# Patient Record
Sex: Male | Born: 1965 | Race: White | Hispanic: No | Marital: Married | Smoking: Former smoker
Health system: Southern US, Community
[De-identification: ages and names within clinical notes are randomized; demographics above are authoritative.]

## PROBLEM LIST (undated history)

## (undated) DIAGNOSIS — Z8619 Personal history of other infectious and parasitic diseases: Secondary | ICD-10-CM

## (undated) DIAGNOSIS — T7840XA Allergy, unspecified, initial encounter: Secondary | ICD-10-CM

## (undated) DIAGNOSIS — F419 Anxiety disorder, unspecified: Secondary | ICD-10-CM

## (undated) DIAGNOSIS — K219 Gastro-esophageal reflux disease without esophagitis: Secondary | ICD-10-CM

## (undated) DIAGNOSIS — I10 Essential (primary) hypertension: Secondary | ICD-10-CM

## (undated) DIAGNOSIS — K7460 Unspecified cirrhosis of liver: Secondary | ICD-10-CM

## (undated) DIAGNOSIS — I219 Acute myocardial infarction, unspecified: Secondary | ICD-10-CM

## (undated) DIAGNOSIS — E119 Type 2 diabetes mellitus without complications: Secondary | ICD-10-CM

## (undated) HISTORY — PX: KNEE SURGERY: SHX244

## (undated) HISTORY — DX: Type 2 diabetes mellitus without complications: E11.9

## (undated) HISTORY — DX: Personal history of other infectious and parasitic diseases: Z86.19

## (undated) HISTORY — DX: Allergy, unspecified, initial encounter: T78.40XA

## (undated) HISTORY — DX: Essential (primary) hypertension: I10

## (undated) HISTORY — DX: Anxiety disorder, unspecified: F41.9

## (undated) HISTORY — DX: Gastro-esophageal reflux disease without esophagitis: K21.9

---

## 2003-08-11 DIAGNOSIS — I2510 Atherosclerotic heart disease of native coronary artery without angina pectoris: Secondary | ICD-10-CM | POA: Insufficient documentation

## 2005-08-10 DIAGNOSIS — R945 Abnormal results of liver function studies: Secondary | ICD-10-CM | POA: Insufficient documentation

## 2006-01-20 ENCOUNTER — Ambulatory Visit: Payer: Self-pay | Admitting: Family Medicine

## 2006-03-24 DIAGNOSIS — E785 Hyperlipidemia, unspecified: Secondary | ICD-10-CM | POA: Insufficient documentation

## 2006-08-10 HISTORY — PX: OTHER SURGICAL HISTORY: SHX169

## 2007-02-08 DIAGNOSIS — I252 Old myocardial infarction: Secondary | ICD-10-CM | POA: Insufficient documentation

## 2007-03-08 ENCOUNTER — Other Ambulatory Visit: Payer: Self-pay

## 2007-03-08 ENCOUNTER — Emergency Department: Payer: Self-pay | Admitting: Emergency Medicine

## 2007-05-17 ENCOUNTER — Ambulatory Visit: Payer: Self-pay | Admitting: Family Medicine

## 2007-08-11 DIAGNOSIS — F410 Panic disorder [episodic paroxysmal anxiety] without agoraphobia: Secondary | ICD-10-CM | POA: Insufficient documentation

## 2007-08-14 ENCOUNTER — Emergency Department: Payer: Self-pay | Admitting: Emergency Medicine

## 2007-08-14 ENCOUNTER — Other Ambulatory Visit: Payer: Self-pay

## 2007-09-12 ENCOUNTER — Ambulatory Visit: Payer: Self-pay | Admitting: Family Medicine

## 2007-09-21 ENCOUNTER — Ambulatory Visit: Payer: Self-pay | Admitting: Family Medicine

## 2007-10-17 ENCOUNTER — Ambulatory Visit: Payer: Self-pay | Admitting: Family Medicine

## 2007-11-26 ENCOUNTER — Other Ambulatory Visit: Payer: Self-pay

## 2007-11-26 ENCOUNTER — Emergency Department: Payer: Self-pay | Admitting: Emergency Medicine

## 2007-12-08 ENCOUNTER — Ambulatory Visit: Payer: Self-pay | Admitting: Family Medicine

## 2008-02-29 ENCOUNTER — Ambulatory Visit (HOSPITAL_COMMUNITY): Admission: RE | Admit: 2008-02-29 | Discharge: 2008-02-29 | Payer: Self-pay | Admitting: Neurology

## 2008-11-16 IMAGING — RF DG UGI W/ SMALL BOWEL
2 series · 14 of 24 positions shown · non-contrast
Comparison: none

REASON FOR EXAM: epigastric pain
COMMENTS:

[Series 1: run · 20 acquisitions, 12 frames shown (1 of 2)]
[im 1/20]
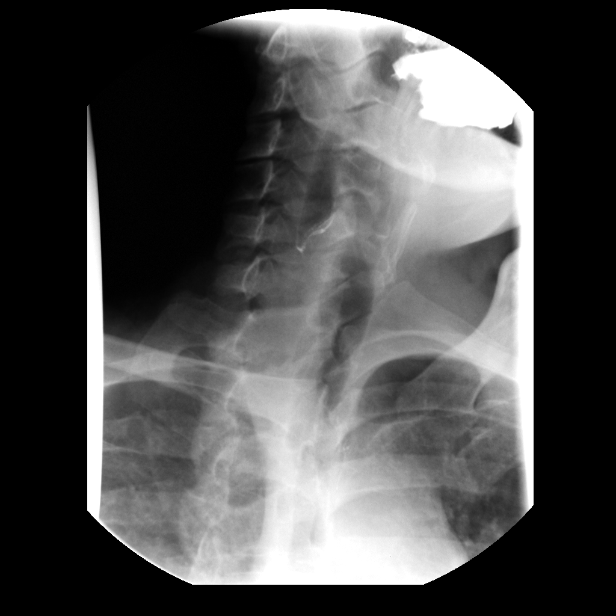
[im 2/20]
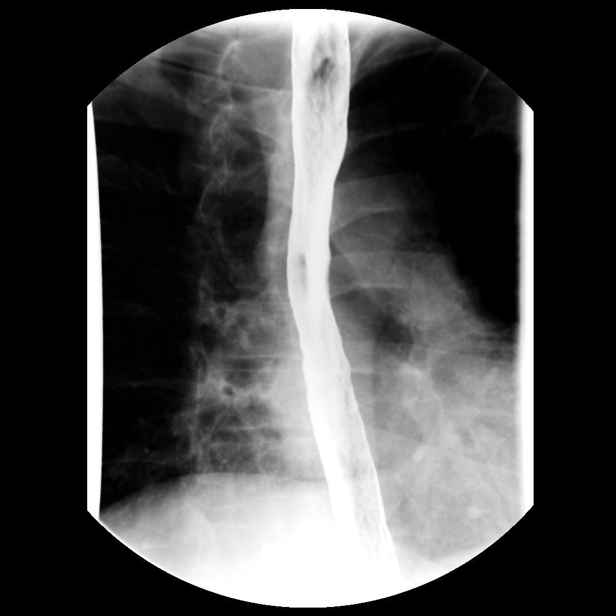
[im 2/20]
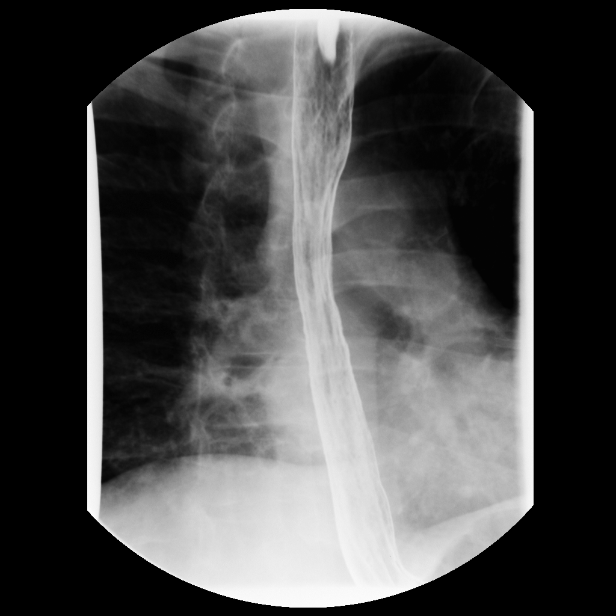
[im 3/20]
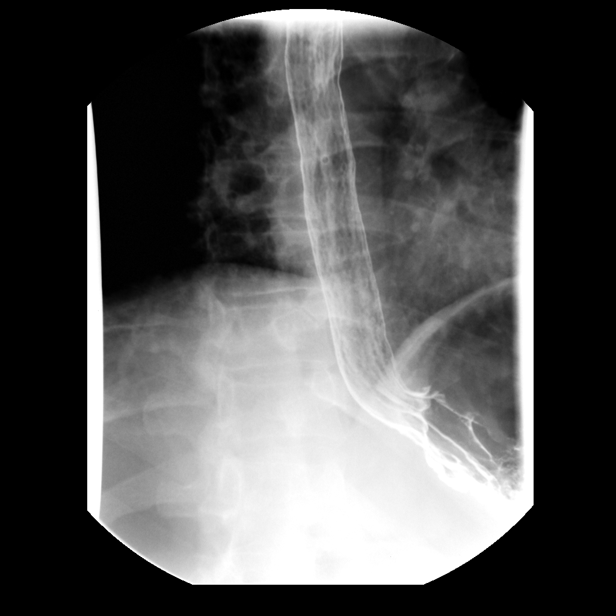
[im 5/20]
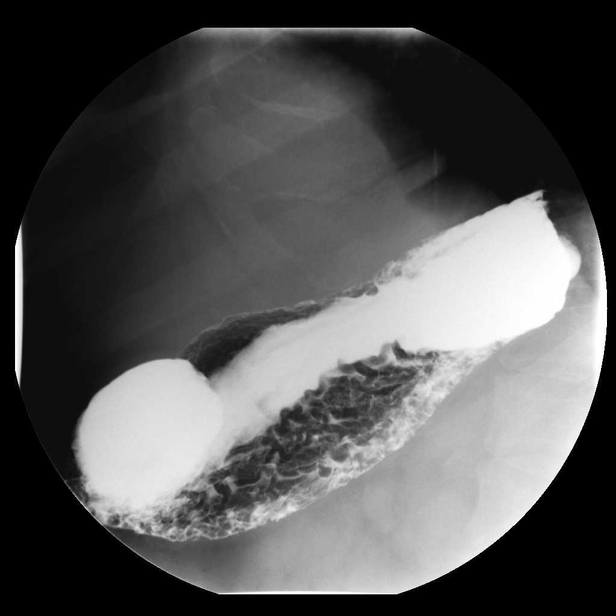
[im 10/20]
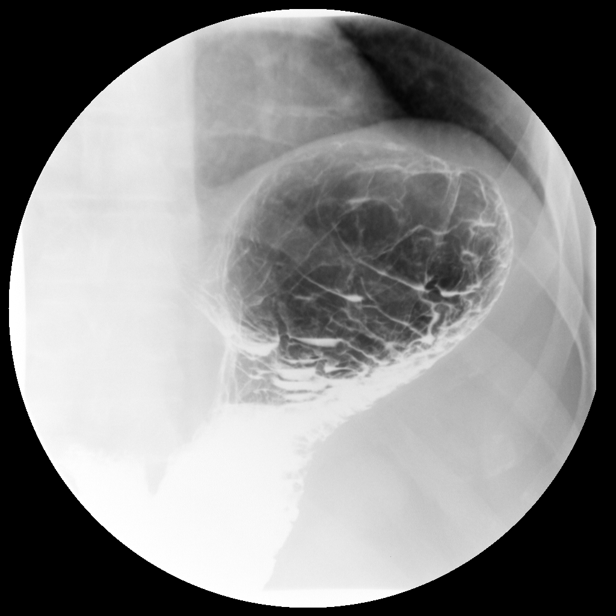
[im 14/20]
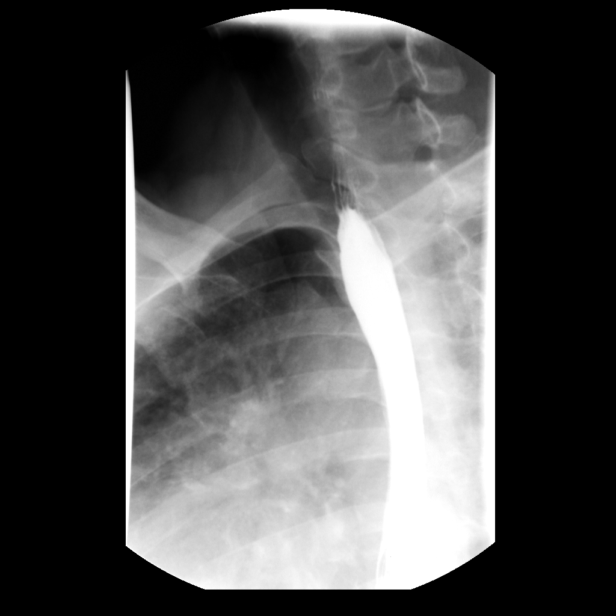
[im 14/20]
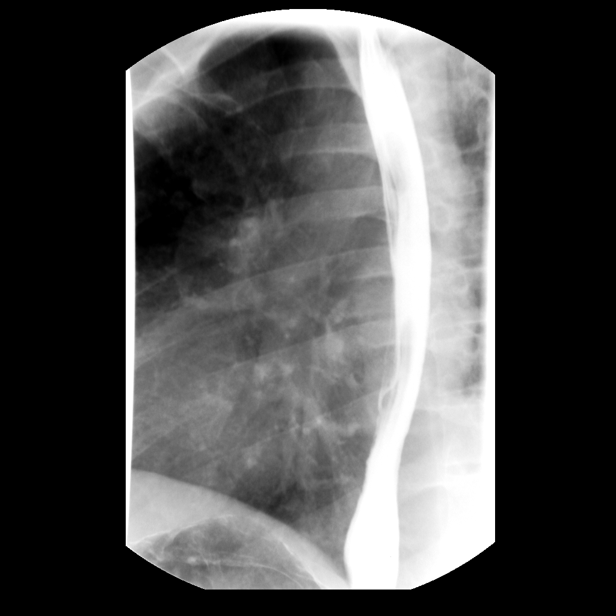
[im 15/20]
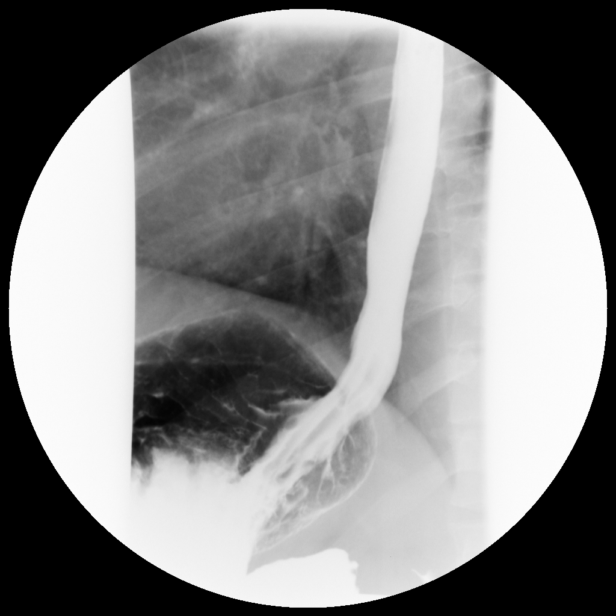
[im 16/20]
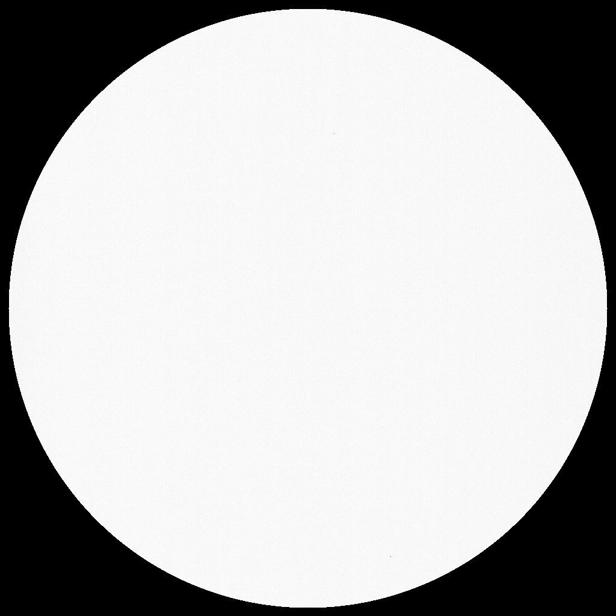
[im 18/20]
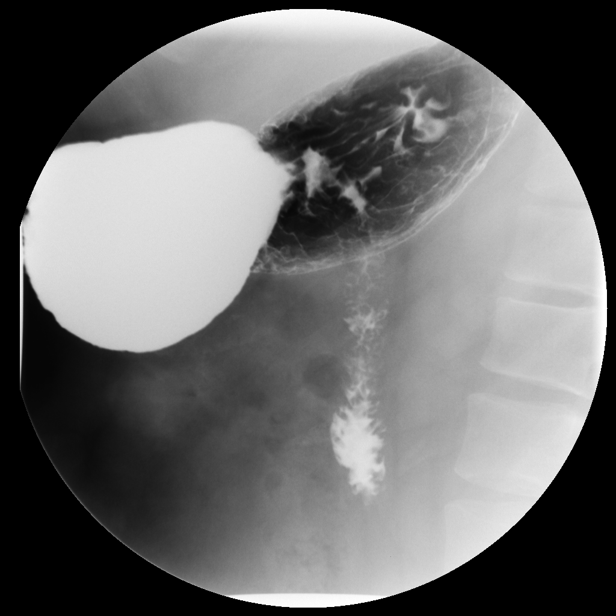
[im 20/20]
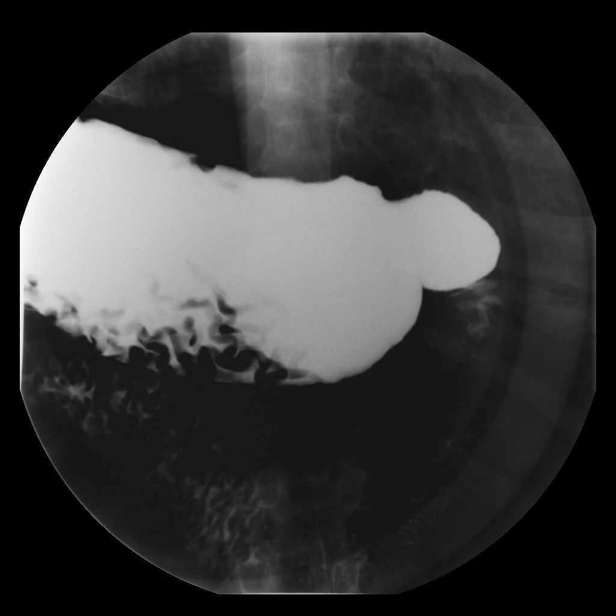

[Series 1: run · 2 of 8 slices shown (2 of 2)]
[im 3/8]
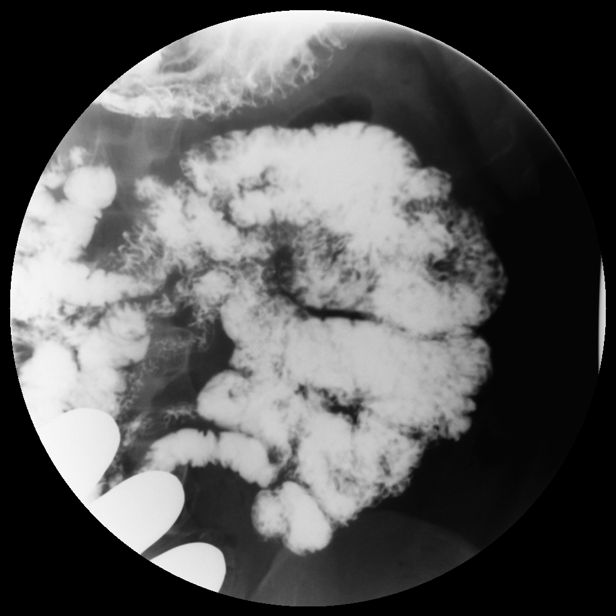
[im 8/8]
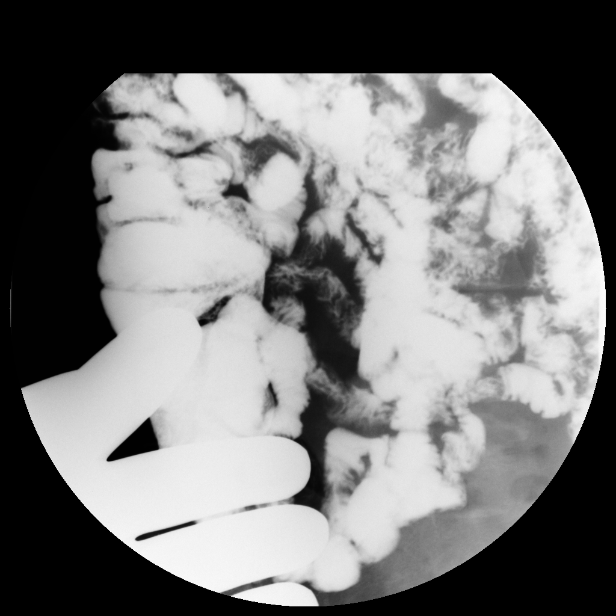

[14 of 24 positions shown; findings below may reference images not displayed]

PROCEDURE:     FL  - FL UPPER GI WITH SMALL BOWEL  - September 21, 2007 [DATE]

RESULT:     Comparison: No available comparison exam.

Procedure:

A scout film of the abdomen was obtained. Following the ingestion of
gas-forming crystals, the patient ingested thick barium in the upright
position. Multiple fluoroscopic spot images of the esophagus were obtained.
The patient was placed in a prone position, and turned from right lateral
decubitus through the supine position to left lateral decubitus. Multiple
fluoroscopic spot images of the stomach and duodenum were obtained.
Following this, the patient was placed in a prone right side down oblique
position and thin barium was ingested while multiple fluoroscopic spot
images of the esophagus were obtained.

Patient ingested additional barium contrast that was followed through the
small bowel to the terminal ileum and into the cecum. Fluoroscopic spot
imaging was performed to more closely evaluate areas of interest.
FINDINGS: Scout image shows no bowel obstruction.

The swallowing function is grossly normal. There seems to be mildly delayed
gastric emptying. The esophagus, gastroesophageal junction, stomach,
pylorus, duodenal bulb, and duodenal sweep are otherwise unremarkable. There
is no evidence of stricture, mass, diverticula, hiatal hernia,
gastroesophageal reflux, or gross ulceration.

Contrast transit time to the colon was 60 minutes. Fluoroscopy revealed
normal mobility. The mucosal pattern and configuration of the small bowel
was normal. There were no persistent filling defects.
IMPRESSION: 1. There seems to be mildly delayed gastric emptying.

2. Unremarkable small bowel follow through.

## 2008-12-05 DIAGNOSIS — K219 Gastro-esophageal reflux disease without esophagitis: Secondary | ICD-10-CM | POA: Insufficient documentation

## 2008-12-05 DIAGNOSIS — E1122 Type 2 diabetes mellitus with diabetic chronic kidney disease: Secondary | ICD-10-CM | POA: Insufficient documentation

## 2008-12-05 DIAGNOSIS — I10 Essential (primary) hypertension: Secondary | ICD-10-CM | POA: Insufficient documentation

## 2009-10-08 DIAGNOSIS — F419 Anxiety disorder, unspecified: Secondary | ICD-10-CM | POA: Insufficient documentation

## 2011-03-06 DIAGNOSIS — Z8619 Personal history of other infectious and parasitic diseases: Secondary | ICD-10-CM | POA: Insufficient documentation

## 2011-09-13 HISTORY — PX: OTHER SURGICAL HISTORY: SHX169

## 2013-11-01 DIAGNOSIS — I700 Atherosclerosis of aorta: Secondary | ICD-10-CM | POA: Insufficient documentation

## 2013-11-01 DIAGNOSIS — I6529 Occlusion and stenosis of unspecified carotid artery: Secondary | ICD-10-CM | POA: Insufficient documentation

## 2014-08-07 DIAGNOSIS — R809 Proteinuria, unspecified: Secondary | ICD-10-CM | POA: Insufficient documentation

## 2014-09-17 LAB — LIPID PANEL
Cholesterol: 176 mg/dL (ref 0–200)
HDL: 35 mg/dL (ref 35–70)
LDL Cholesterol: 111 mg/dL
Triglycerides: 149 mg/dL (ref 40–160)

## 2014-09-17 LAB — CBC AND DIFFERENTIAL
HEMATOCRIT: 44 % (ref 41–53)
HEMOGLOBIN: 15 g/dL (ref 13.5–17.5)
Platelets: 256 10*3/uL (ref 150–399)
WBC: 7.7 10^3/mL

## 2014-09-17 LAB — HEPATIC FUNCTION PANEL: ALT: 61 U/L — AB (ref 10–40)

## 2015-01-29 ENCOUNTER — Other Ambulatory Visit: Payer: Self-pay | Admitting: *Deleted

## 2015-01-29 MED ORDER — CLOPIDOGREL BISULFATE 75 MG PO TABS: 75.0000 mg | ORAL_TABLET | Freq: Every day | ORAL | Status: DC

## 2015-01-29 MED ORDER — LISINOPRIL 10 MG PO TABS: 10.0000 mg | ORAL_TABLET | Freq: Every day | ORAL | Status: DC

## 2015-01-29 NOTE — Telephone Encounter (Signed)
Refill request for clopidogrel 75 mg and Lisinopril 10 mg Last filled by MD on- 12/12/2013 #90 x3 Last Appt: 12/13/2014 Next Appt: none Please advise refill?

## 2015-05-23 ENCOUNTER — Other Ambulatory Visit: Payer: Self-pay | Admitting: Family Medicine

## 2015-07-22 ENCOUNTER — Other Ambulatory Visit: Payer: Self-pay | Admitting: Family Medicine

## 2015-09-11 ENCOUNTER — Other Ambulatory Visit: Payer: Self-pay | Admitting: Family Medicine

## 2015-10-23 ENCOUNTER — Other Ambulatory Visit: Payer: Self-pay | Admitting: Family Medicine

## 2015-11-25 ENCOUNTER — Other Ambulatory Visit: Payer: Self-pay | Admitting: Family Medicine

## 2015-11-25 DIAGNOSIS — E119 Type 2 diabetes mellitus without complications: Secondary | ICD-10-CM | POA: Diagnosis not present

## 2015-12-04 ENCOUNTER — Other Ambulatory Visit: Payer: Self-pay | Admitting: Family Medicine

## 2015-12-29 ENCOUNTER — Other Ambulatory Visit: Payer: Self-pay | Admitting: Family Medicine

## 2016-01-01 DIAGNOSIS — E119 Type 2 diabetes mellitus without complications: Secondary | ICD-10-CM | POA: Diagnosis not present

## 2016-01-14 DIAGNOSIS — E1165 Type 2 diabetes mellitus with hyperglycemia: Secondary | ICD-10-CM | POA: Diagnosis not present

## 2016-01-14 DIAGNOSIS — Z794 Long term (current) use of insulin: Secondary | ICD-10-CM | POA: Diagnosis not present

## 2016-01-14 DIAGNOSIS — Z9641 Presence of insulin pump (external) (internal): Secondary | ICD-10-CM | POA: Diagnosis not present

## 2016-01-14 DIAGNOSIS — E1129 Type 2 diabetes mellitus with other diabetic kidney complication: Secondary | ICD-10-CM | POA: Diagnosis not present

## 2016-01-20 ENCOUNTER — Ambulatory Visit: Payer: Self-pay | Admitting: Family Medicine

## 2016-01-20 DIAGNOSIS — R5383 Other fatigue: Secondary | ICD-10-CM | POA: Insufficient documentation

## 2016-01-20 DIAGNOSIS — J309 Allergic rhinitis, unspecified: Secondary | ICD-10-CM | POA: Insufficient documentation

## 2016-01-20 DIAGNOSIS — Z79899 Other long term (current) drug therapy: Secondary | ICD-10-CM | POA: Insufficient documentation

## 2016-01-20 DIAGNOSIS — K824 Cholesterolosis of gallbladder: Secondary | ICD-10-CM | POA: Insufficient documentation

## 2016-01-21 ENCOUNTER — Ambulatory Visit (INDEPENDENT_AMBULATORY_CARE_PROVIDER_SITE_OTHER): Payer: BLUE CROSS/BLUE SHIELD | Admitting: Family Medicine

## 2016-01-21 ENCOUNTER — Encounter: Payer: Self-pay | Admitting: Family Medicine

## 2016-01-21 VITALS — BP 128/76 | HR 81 | Temp 98.1°F | Resp 16 | Wt 235.0 lb

## 2016-01-21 DIAGNOSIS — I2510 Atherosclerotic heart disease of native coronary artery without angina pectoris: Secondary | ICD-10-CM

## 2016-01-21 DIAGNOSIS — F410 Panic disorder [episodic paroxysmal anxiety] without agoraphobia: Secondary | ICD-10-CM

## 2016-01-21 DIAGNOSIS — K219 Gastro-esophageal reflux disease without esophagitis: Secondary | ICD-10-CM | POA: Diagnosis not present

## 2016-01-21 MED ORDER — SERTRALINE HCL 100 MG PO TABS: 100.0000 mg | ORAL_TABLET | Freq: Every day | ORAL | Status: DC

## 2016-01-21 MED ORDER — PANTOPRAZOLE SODIUM 40 MG PO TBEC: 40.0000 mg | DELAYED_RELEASE_TABLET | Freq: Every day | ORAL | Status: DC

## 2016-01-21 NOTE — Progress Notes (Signed)
Patient: Alex [name] Male    DOB: 05/02/1966   50 y.o.   MRN: [id number] Visit Date: 01/21/2016  Today's Provider: Lelon Huh, MD   Chief Complaint  Patient presents with  . Follow-up  . Hypertension  . Gastroesophageal Reflux  . Hyperlipidemia  . Anxiety   Subjective:    HPI   Follow-up for GERD from 09/14/2014 no changes. Taking pantoprazole every day which completely controls reflux symptoms. He starts having heartburn and reflux 1-2 days if he misses medication. Denies any adverse effects. Takes Reglan occasionally.   Follow-up for CAD from 09/14/2014 no changes.  Still seeing Nehemiah Massed twice a year. No chest pains or dyspnea. Working full time  Follow-up for anxiety disorder from 09/14/2014 no changes. Doing very well on sertraline. Rarely has anxiety attacks.      Hypertension, follow-up:  BP Readings from Last 3 Encounters:  01/21/16 128/76    He was last seen for hypertension 09/14/2014.  BP at that visit was 124/80 Management since that visit includes;He reports good compliance with treatment. He is not having side effects. none  He is not exercising. He is adherent to low salt diet.   Outside blood pressures are none. He is experiencing none.  Patient denies none.   Cardiovascular risk factors include diabetes mellitus.  Use of agents associated with hypertension: none.   ----------------------------------------------------------------------    Lipid/Cholesterol, Follow-up:   Last seen for this 09/14/2014. Management since that visit includes; recommended he cut back on saturated fats in diet  Last Lipid Panel: Last month with Dr. Gabriel Carina and are well controlled.   He reports good compliance with treatment. He is not having side effects. none  Wt Readings from Last 3 Encounters:  01/21/16 235 lb (106.595 kg)    ----------------------------------------------------------------------  Patient Care Team    Relationship Specialty Notifications  Start End  Birdie Sons, MD PCP - General Family Medicine  01/20/16   Corey Skains, MD Consulting Physician Internal Medicine  01/20/16   Judi Cong, MD Physician Assistant Internal Medicine  01/21/16       Allergies  Allergen Reactions  . Aleve  [Naproxen Sodium]   . Mobic  [Meloxicam]   . Voltaren  [Diclofenac Sodium]    Current Meds  Medication Sig  . aspirin 81 MG tablet   . clopidogrel (PLAVIX) 75 MG tablet take 1 tablet by mouth once daily  . Cyanocobalamin (VITAMIN B 12) 250 MCG LOZG Take by mouth.  . fluticasone (FLONASE) 50 MCG/ACT nasal spray Place into the nose.  Marland Kitchen glipiZIDE (GLUCOTROL XL) 10 MG 24 hr tablet Take 10 mg by mouth daily.   Marland Kitchen glucose blood test strip   . Insulin Glargine (TOUJEO SOLOSTAR) 300 UNIT/ML SOPN Inject 77 Units into the skin daily.   . insulin lispro (HUMALOG KWIKPEN) 100 UNIT/ML KiwkPen Inject 15 Units into the skin daily.   Marland Kitchen lisinopril (PRINIVIL,ZESTRIL) 10 MG tablet Take 1 tablet (10 mg total) by mouth daily.  Marland Kitchen LORazepam (ATIVAN) 0.5 MG tablet Take by mouth. 1-2 every 6 hours as needed  . metFORMIN (GLUCOPHAGE) 500 MG tablet Take 500 mg by mouth daily.   . metoCLOPramide (REGLAN) 10 MG tablet Take by mouth as needed.   . Omega-3 Fatty Acids (FISH OIL) 1000 MG CAPS Take 1 capsule by mouth 2 (two) times daily.   . pantoprazole (PROTONIX) 40 MG tablet take 1 tablet by mouth once daily  . rosuvastatin (CRESTOR) 40 MG tablet Take  1 tablet (40 mg total) by mouth daily. PATIENT NEEDS TO SCHEDULE OFFICE VISIT FOR FOLLOW UP  . sertraline (ZOLOFT) 100 MG tablet take 1 tablet by mouth once daily    Review of Systems  Constitutional: Negative for fever, chills and appetite change.  Respiratory: Negative for chest tightness, shortness of breath and wheezing.   Cardiovascular: Negative for chest pain and palpitations.  Gastrointestinal: Negative for nausea, vomiting and abdominal pain.    Social History  Substance Use Topics  . Smoking status:  Former Smoker -- 1.50 packs/day for 20 years    Types: Cigarettes    Quit date: 08/10/2006  . Smokeless tobacco: Not on file  . Alcohol Use: No   Objective:   BP 128/76 mmHg  Pulse 81  Temp(Src) 98.1 F (36.7 C) (Oral)  Resp 16  Wt 235 lb (106.595 kg)  SpO2 97%  Physical Exam   General Appearance:    Alert, cooperative, no distress  Eyes:    PERRL, conjunctiva/corneas clear, EOM's intact       Lungs:     Clear to auscultation bilaterally, respirations unlabored  Heart:    Regular rate and rhythm  Neurologic:   Awake, alert, oriented x 3. No apparent focal neurological           defect.          Assessment & Plan:     1. Gastroesophageal reflux disease, esophagitis presence not specified Well controlled.  Continue current medications.    2. Coronary artery disease involving native coronary artery of native heart without angina pectoris Asymptomatic. Compliant with medication.  Continue aggressive risk factor modification.  Continue regular follow up Dr. Nehemiah Massed  3. Panic disorder Well controlled on daily sertraline and occasional prn lorazepam.  - sertraline (ZOLOFT) 100 MG tablet; Take 1 tablet (100 mg total) by mouth daily.  Dispense: 90 tablet; Refill: 3        Lelon Huh, MD  [facility]

## 2016-02-04 DIAGNOSIS — E119 Type 2 diabetes mellitus without complications: Secondary | ICD-10-CM | POA: Diagnosis not present

## 2016-03-03 DIAGNOSIS — E119 Type 2 diabetes mellitus without complications: Secondary | ICD-10-CM | POA: Diagnosis not present

## 2016-03-12 ENCOUNTER — Other Ambulatory Visit: Payer: Self-pay | Admitting: Family Medicine

## 2016-03-18 DIAGNOSIS — I2510 Atherosclerotic heart disease of native coronary artery without angina pectoris: Secondary | ICD-10-CM | POA: Diagnosis not present

## 2016-03-18 DIAGNOSIS — I10 Essential (primary) hypertension: Secondary | ICD-10-CM | POA: Diagnosis not present

## 2016-03-18 DIAGNOSIS — I6523 Occlusion and stenosis of bilateral carotid arteries: Secondary | ICD-10-CM | POA: Diagnosis not present

## 2016-03-18 DIAGNOSIS — K210 Gastro-esophageal reflux disease with esophagitis: Secondary | ICD-10-CM | POA: Diagnosis not present

## 2016-03-30 DIAGNOSIS — E119 Type 2 diabetes mellitus without complications: Secondary | ICD-10-CM | POA: Diagnosis not present

## 2016-04-14 DIAGNOSIS — Z9641 Presence of insulin pump (external) (internal): Secondary | ICD-10-CM | POA: Diagnosis not present

## 2016-04-14 DIAGNOSIS — E1129 Type 2 diabetes mellitus with other diabetic kidney complication: Secondary | ICD-10-CM | POA: Diagnosis not present

## 2016-04-14 DIAGNOSIS — E1165 Type 2 diabetes mellitus with hyperglycemia: Secondary | ICD-10-CM | POA: Diagnosis not present

## 2016-04-14 DIAGNOSIS — E119 Type 2 diabetes mellitus without complications: Secondary | ICD-10-CM | POA: Diagnosis not present

## 2016-04-14 DIAGNOSIS — Z794 Long term (current) use of insulin: Secondary | ICD-10-CM | POA: Diagnosis not present

## 2016-04-14 DIAGNOSIS — R809 Proteinuria, unspecified: Secondary | ICD-10-CM | POA: Diagnosis not present

## 2016-05-11 DIAGNOSIS — E119 Type 2 diabetes mellitus without complications: Secondary | ICD-10-CM | POA: Diagnosis not present

## 2016-07-07 DIAGNOSIS — E119 Type 2 diabetes mellitus without complications: Secondary | ICD-10-CM | POA: Diagnosis not present

## 2016-07-07 DIAGNOSIS — E1165 Type 2 diabetes mellitus with hyperglycemia: Secondary | ICD-10-CM | POA: Diagnosis not present

## 2016-07-07 DIAGNOSIS — Z794 Long term (current) use of insulin: Secondary | ICD-10-CM | POA: Diagnosis not present

## 2016-07-15 DIAGNOSIS — E1129 Type 2 diabetes mellitus with other diabetic kidney complication: Secondary | ICD-10-CM | POA: Diagnosis not present

## 2016-07-15 DIAGNOSIS — Z794 Long term (current) use of insulin: Secondary | ICD-10-CM | POA: Diagnosis not present

## 2016-07-15 DIAGNOSIS — Z9641 Presence of insulin pump (external) (internal): Secondary | ICD-10-CM | POA: Diagnosis not present

## 2016-07-15 DIAGNOSIS — E1165 Type 2 diabetes mellitus with hyperglycemia: Secondary | ICD-10-CM | POA: Diagnosis not present

## 2016-08-11 ENCOUNTER — Ambulatory Visit (INDEPENDENT_AMBULATORY_CARE_PROVIDER_SITE_OTHER): Payer: BLUE CROSS/BLUE SHIELD | Admitting: Family Medicine

## 2016-08-11 ENCOUNTER — Encounter: Payer: Self-pay | Admitting: Family Medicine

## 2016-08-11 VITALS — BP 136/84 | HR 72 | Temp 98.9°F | Resp 16 | Wt 236.0 lb

## 2016-08-11 DIAGNOSIS — M545 Low back pain, unspecified: Secondary | ICD-10-CM

## 2016-08-11 MED ORDER — CYCLOBENZAPRINE HCL 5 MG PO TABS: 5.0000 mg | ORAL_TABLET | Freq: Three times a day (TID) | ORAL | 0 refills | Status: DC | PRN

## 2016-08-11 MED ORDER — PREDNISONE 10 MG PO TABS: ORAL_TABLET | ORAL | 0 refills | Status: DC

## 2016-08-11 NOTE — Progress Notes (Signed)
Subjective:     Patient ID: SHIGETO SCHRAM, male   DOB: February 13, 1966, 51 y.o.   MRN: [id number]  HPI  Chief Complaint  Patient presents with  . Back Pain    Patient reports that he has had pain in his lower back X 5 days. Patient reports that the pain does radiate to both hips. He has been taking Advil with no relief.   Hx of lumbar DDD but no prior surgery. Instructed not to use further nsaid's due to his hx of CV disease and plavix use. Denies specific injury. He is aware that his sugar will go up but he is on an insulin pump.   Review of Systems     Objective:   Physical Exam  Constitutional: He appears well-developed and well-nourished. No distress.  Musculoskeletal:  Muscle strength in lower extremities 5/5. SLR's to 90 degrees without radiation of back pain. Appears stiff when changing positions but not in pain.       Assessment:    1. Acute left-sided low back pain without sciatica - predniSONE (DELTASONE) 10 MG tablet; Taper daily as follows: 6 pills, 5, 4, 3, 2, 1  Dispense: 21 tablet; Refill: 0 - cyclobenzaprine (FLEXERIL) 5 MG tablet; Take 1 tablet (5 mg total) by mouth 3 (three) times daily as needed for muscle spasms.  Dispense: 30 tablet; Refill: 0    Plan:    Discussed use of Tylenol and heat.

## 2016-08-11 NOTE — Patient Instructions (Signed)
Discussed continued use of heat up to 20 minutes several x day and up to 3000 mg./ Tylenol daily.

## 2016-09-17 DIAGNOSIS — E782 Mixed hyperlipidemia: Secondary | ICD-10-CM | POA: Diagnosis not present

## 2016-09-17 DIAGNOSIS — I2510 Atherosclerotic heart disease of native coronary artery without angina pectoris: Secondary | ICD-10-CM | POA: Diagnosis not present

## 2016-09-17 DIAGNOSIS — I10 Essential (primary) hypertension: Secondary | ICD-10-CM | POA: Diagnosis not present

## 2016-09-17 DIAGNOSIS — R002 Palpitations: Secondary | ICD-10-CM | POA: Diagnosis not present

## 2016-09-30 ENCOUNTER — Other Ambulatory Visit: Payer: Self-pay | Admitting: Family Medicine

## 2016-10-07 DIAGNOSIS — E1165 Type 2 diabetes mellitus with hyperglycemia: Secondary | ICD-10-CM | POA: Diagnosis not present

## 2016-10-07 DIAGNOSIS — Z794 Long term (current) use of insulin: Secondary | ICD-10-CM | POA: Diagnosis not present

## 2016-10-14 DIAGNOSIS — Z9641 Presence of insulin pump (external) (internal): Secondary | ICD-10-CM | POA: Diagnosis not present

## 2016-10-14 DIAGNOSIS — E1165 Type 2 diabetes mellitus with hyperglycemia: Secondary | ICD-10-CM | POA: Diagnosis not present

## 2016-10-14 DIAGNOSIS — Z794 Long term (current) use of insulin: Secondary | ICD-10-CM | POA: Diagnosis not present

## 2016-10-14 DIAGNOSIS — E1129 Type 2 diabetes mellitus with other diabetic kidney complication: Secondary | ICD-10-CM | POA: Diagnosis not present

## 2016-12-02 DIAGNOSIS — E119 Type 2 diabetes mellitus without complications: Secondary | ICD-10-CM | POA: Diagnosis not present

## 2017-01-06 DIAGNOSIS — Z794 Long term (current) use of insulin: Secondary | ICD-10-CM | POA: Diagnosis not present

## 2017-01-06 DIAGNOSIS — E1165 Type 2 diabetes mellitus with hyperglycemia: Secondary | ICD-10-CM | POA: Diagnosis not present

## 2017-01-06 LAB — MICROALBUMIN, URINE: MICROALB UR: 20

## 2017-01-06 LAB — HEMOGLOBIN A1C: Hemoglobin A1C: 7.3

## 2017-01-12 DIAGNOSIS — E1165 Type 2 diabetes mellitus with hyperglycemia: Secondary | ICD-10-CM | POA: Diagnosis not present

## 2017-01-12 DIAGNOSIS — E1129 Type 2 diabetes mellitus with other diabetic kidney complication: Secondary | ICD-10-CM | POA: Diagnosis not present

## 2017-01-12 DIAGNOSIS — Z794 Long term (current) use of insulin: Secondary | ICD-10-CM | POA: Diagnosis not present

## 2017-01-12 DIAGNOSIS — Z9641 Presence of insulin pump (external) (internal): Secondary | ICD-10-CM | POA: Diagnosis not present

## 2017-01-21 ENCOUNTER — Other Ambulatory Visit: Payer: Self-pay | Admitting: Family Medicine

## 2017-01-28 ENCOUNTER — Encounter: Payer: Self-pay | Admitting: *Deleted

## 2017-02-22 DIAGNOSIS — E119 Type 2 diabetes mellitus without complications: Secondary | ICD-10-CM | POA: Diagnosis not present

## 2017-02-24 ENCOUNTER — Other Ambulatory Visit: Payer: Self-pay | Admitting: Family Medicine

## 2017-02-24 DIAGNOSIS — I6523 Occlusion and stenosis of bilateral carotid arteries: Secondary | ICD-10-CM | POA: Diagnosis not present

## 2017-02-24 DIAGNOSIS — F410 Panic disorder [episodic paroxysmal anxiety] without agoraphobia: Secondary | ICD-10-CM

## 2017-02-24 DIAGNOSIS — E782 Mixed hyperlipidemia: Secondary | ICD-10-CM | POA: Diagnosis not present

## 2017-02-24 DIAGNOSIS — I2510 Atherosclerotic heart disease of native coronary artery without angina pectoris: Secondary | ICD-10-CM | POA: Diagnosis not present

## 2017-02-24 DIAGNOSIS — I10 Essential (primary) hypertension: Secondary | ICD-10-CM | POA: Diagnosis not present

## 2017-03-23 ENCOUNTER — Other Ambulatory Visit: Payer: Self-pay | Admitting: Family Medicine

## 2017-04-06 DIAGNOSIS — Z794 Long term (current) use of insulin: Secondary | ICD-10-CM | POA: Diagnosis not present

## 2017-04-06 DIAGNOSIS — E1165 Type 2 diabetes mellitus with hyperglycemia: Secondary | ICD-10-CM | POA: Diagnosis not present

## 2017-04-13 DIAGNOSIS — E1165 Type 2 diabetes mellitus with hyperglycemia: Secondary | ICD-10-CM | POA: Diagnosis not present

## 2017-04-13 DIAGNOSIS — E119 Type 2 diabetes mellitus without complications: Secondary | ICD-10-CM | POA: Diagnosis not present

## 2017-04-13 DIAGNOSIS — E1129 Type 2 diabetes mellitus with other diabetic kidney complication: Secondary | ICD-10-CM | POA: Diagnosis not present

## 2017-04-13 DIAGNOSIS — Z794 Long term (current) use of insulin: Secondary | ICD-10-CM | POA: Diagnosis not present

## 2017-04-13 DIAGNOSIS — E1159 Type 2 diabetes mellitus with other circulatory complications: Secondary | ICD-10-CM | POA: Diagnosis not present

## 2017-04-19 ENCOUNTER — Other Ambulatory Visit: Payer: Self-pay | Admitting: Family Medicine

## 2017-05-26 DIAGNOSIS — E119 Type 2 diabetes mellitus without complications: Secondary | ICD-10-CM | POA: Diagnosis not present

## 2017-06-28 ENCOUNTER — Other Ambulatory Visit: Payer: Self-pay | Admitting: Family Medicine

## 2017-06-30 NOTE — Telephone Encounter (Signed)
Not seen in a year and a half, needs o.v. Scheduled within next before refill can be approved.

## 2017-06-30 NOTE — Telephone Encounter (Signed)
Patient advised. Follow up scheduled for 07/05/17

## 2017-07-05 ENCOUNTER — Ambulatory Visit
Admission: RE | Admit: 2017-07-05 | Discharge: 2017-07-05 | Disposition: A | Discharge status: Home / Self Care | Payer: BLUE CROSS/BLUE SHIELD | Source: Ambulatory Visit | Attending: Family Medicine | Admitting: Family Medicine

## 2017-07-05 ENCOUNTER — Encounter: Payer: Self-pay | Admitting: Family Medicine

## 2017-07-05 ENCOUNTER — Ambulatory Visit (INDEPENDENT_AMBULATORY_CARE_PROVIDER_SITE_OTHER): Payer: BLUE CROSS/BLUE SHIELD | Admitting: Family Medicine

## 2017-07-05 VITALS — BP 120/80 | HR 94 | Temp 98.1°F | Resp 16 | Ht 70.0 in | Wt 243.0 lb

## 2017-07-05 DIAGNOSIS — M25531 Pain in right wrist: Secondary | ICD-10-CM

## 2017-07-05 DIAGNOSIS — F419 Anxiety disorder, unspecified: Secondary | ICD-10-CM

## 2017-07-05 DIAGNOSIS — I2510 Atherosclerotic heart disease of native coronary artery without angina pectoris: Secondary | ICD-10-CM

## 2017-07-05 DIAGNOSIS — I10 Essential (primary) hypertension: Secondary | ICD-10-CM

## 2017-07-05 DIAGNOSIS — E785 Hyperlipidemia, unspecified: Secondary | ICD-10-CM | POA: Diagnosis not present

## 2017-07-05 DIAGNOSIS — E1122 Type 2 diabetes mellitus with diabetic chronic kidney disease: Secondary | ICD-10-CM | POA: Diagnosis not present

## 2017-07-05 DIAGNOSIS — E669 Obesity, unspecified: Secondary | ICD-10-CM | POA: Insufficient documentation

## 2017-07-05 DIAGNOSIS — Z794 Long term (current) use of insulin: Secondary | ICD-10-CM

## 2017-07-05 DIAGNOSIS — N182 Chronic kidney disease, stage 2 (mild): Secondary | ICD-10-CM

## 2017-07-05 DIAGNOSIS — S6991XA Unspecified injury of right wrist, hand and finger(s), initial encounter: Secondary | ICD-10-CM | POA: Diagnosis not present

## 2017-07-05 MED ORDER — ROSUVASTATIN CALCIUM 40 MG PO TABS: 40.0000 mg | ORAL_TABLET | Freq: Every day | ORAL | 0 refills | Status: DC

## 2017-07-05 NOTE — Patient Instructions (Addendum)
   Go to the [facility] on [address] for wrist Xray   You may be candidate for Jardiance or Wilder Glade which are medications that lower your blood sugar and reduce risk of heart attacks. I suggest you discuss this with your endocrinologist

## 2017-07-05 NOTE — Progress Notes (Signed)
Patient: Alex [name] Male    DOB: 1966/03/13   51 y.o.   MRN: [id number] Visit Date: 07/05/2017  Today's Provider: Lelon Huh, MD   Chief Complaint  Patient presents with  . Follow-up  . Hyperlipidemia  . Hypertension   Subjective:    HPI   Hypertension, follow-up:  BP Readings from Last 3 Encounters:  07/05/17 120/80  08/11/16 136/84  01/21/16 128/76    He was last seen for hypertension 01/21/2016.  BP at that visit was 128/78. Management since that visit includes; no changes.He reports good compliance with treatment. He is not having side effects. none He is not exercising. He is not adherent to low salt diet.   Outside blood pressures are not checking. He is experiencing none.  Patient denies none.   Cardiovascular risk factors include diabetes mellitus.  Use of agents associated with hypertension: none.   ----------------------------------------------------------------     Lipid/Cholesterol, Follow-up:   Last seen for this 01/21/2016.  Management since that visit includes; .  Last Lipid Panel:    Component Value Date/Time   CHOL 176 09/17/2014   TRIG 149 09/17/2014   HDL 35 09/17/2014   LDLCALC 111 09/17/2014    He reports good compliance with treatment. He is not having side effects. none  Wt Readings from Last 3 Encounters:  07/05/17 243 lb (110.2 kg)  08/11/16 236 lb (107 kg)  01/21/16 235 lb (106.6 kg)    ----------------------------------------------------------------  He continues to follow up with Dr. Gabriel Carina for diabetes, last A1c in august was 9.3, he states he has been making some adjustments to his basal insulin.   He continues to follow up with Dr. Marisue Brooklyn for CAD. Has had no chest pain, dyspnea, or heart flutters. Is tolerating medication well and taking consistently.   He is also here to follow up with anxiety and panic attacks. He states he has had very little trouble with this the last couple of year which he  attributes to sertraline. He is taking consistently and tolerating well. '  Patient is also having right wrist pain.  He states he is up to date on diabetic eye exams having been to [facility] earlier this year.    Allergies  Allergen Reactions  . Aleve  [Naproxen Sodium]   . Mobic  [Meloxicam]   . Voltaren  [Diclofenac Sodium]      Current Outpatient Medications:  .  aspirin 81 MG tablet, , Disp: , Rfl:  .  clopidogrel (PLAVIX) 75 MG tablet, take 1 tablet by mouth once daily, Disp: 90 tablet, Rfl: 3 .  Cyanocobalamin (VITAMIN B 12) 250 MCG LOZG, Take by mouth., Disp: , Rfl:  .  fluticasone (FLONASE) 50 MCG/ACT nasal spray, Place into the nose., Disp: , Rfl:  .  glipiZIDE (GLUCOTROL XL) 10 MG 24 hr tablet, Take 10 mg by mouth daily. , Disp: , Rfl:  .  glucose blood test strip, , Disp: , Rfl:  .  insulin regular human CONCENTRATED (HUMULIN R) 500 UNIT/ML injection, Use up to 250 units (as calculated by U100 syringe) daily via insulin pump, Disp: , Rfl:  .  lisinopril (PRINIVIL,ZESTRIL) 10 MG tablet, Take 1 tablet (10 mg total) by mouth daily., Disp: 90 tablet, Rfl: 1 .  LORazepam (ATIVAN) 0.5 MG tablet, Take by mouth. 1-2 every 6 hours as needed, Disp: , Rfl:  .  metFORMIN (GLUCOPHAGE) 500 MG tablet, Take 500 mg by mouth daily. , Disp: , Rfl:  .  metoCLOPramide (REGLAN) 10 MG tablet, Take by mouth as needed. , Disp: , Rfl:  .  Omega-3 Fatty Acids (FISH OIL) 1000 MG CAPS, Take 1 capsule by mouth 2 (two) times daily. , Disp: , Rfl:  .  pantoprazole (PROTONIX) 40 MG tablet, take 1 tablet by mouth once daily, Disp: 90 tablet, Rfl: 0 .  rosuvastatin (CRESTOR) 40 MG tablet, take 1 tablet by mouth once daily, Disp: 90 tablet, Rfl: 0 .  sertraline (ZOLOFT) 100 MG tablet, take 1 tablet by mouth once daily, Disp: 90 tablet, Rfl: 0 .  cyclobenzaprine (FLEXERIL) 5 MG tablet, Take 1 tablet (5 mg total) by mouth 3 (three) times daily as needed for muscle spasms. (Patient not taking: Reported  on 07/05/2017), Disp: 30 tablet, Rfl: 0 .  insulin aspart (NOVOLOG) 100 UNIT/ML injection, , Disp: , Rfl:  .  Insulin Glargine (TOUJEO SOLOSTAR) 300 UNIT/ML SOPN, Inject 77 Units into the skin daily. Reported on 01/21/2016, Disp: , Rfl:  .  insulin lispro (HUMALOG KWIKPEN) 100 UNIT/ML KiwkPen, Inject 15 Units into the skin daily. Reported on 01/21/2016, Disp: , Rfl:  .  predniSONE (DELTASONE) 10 MG tablet, Taper daily as follows: 6 pills, 5, 4, 3, 2, 1 (Patient not taking: Reported on 07/05/2017), Disp: 21 tablet, Rfl: 0  Review of Systems  Constitutional: Negative for appetite change, chills and fever.  Respiratory: Negative for chest tightness, shortness of breath and wheezing.   Cardiovascular: Negative for chest pain and palpitations.  Gastrointestinal: Negative for abdominal pain, nausea and vomiting.    Social History   Tobacco Use  . Smoking status: Former Smoker    Packs/day: 1.50    Years: 20.00    Pack years: 30.00    Types: Cigarettes    Last attempt to quit: 08/10/2006    Years since quitting: 10.9  . Smokeless tobacco: Never Used  Substance Use Topics  . Alcohol use: No    Alcohol/week: 0.0 oz   Objective:   BP 120/80 (BP Location: Right Arm, Patient Position: Sitting, Cuff Size: Large)   Temp 98.1 F (36.7 C) (Oral)   Resp 16   Ht 5\' 10"  (1.778 m)   Wt 243 lb (110.2 kg)   BMI 34.87 kg/m  Vitals:   07/05/17 0833  BP: 120/80  Resp: 16  Temp: 98.1 F (36.7 C)  TempSrc: Oral  Weight: 243 lb (110.2 kg)  Height: 5\' 10"  (1.778 m)     Physical Exam   General Appearance:    Alert, cooperative, no distress, overweight.   Eyes:    PERRL, conjunctiva/corneas clear, EOM's intact       Lungs:     Clear to auscultation bilaterally, respirations unlabored  Heart:    Regular rate and rhythm  Neurologic:   Awake, alert, oriented x 3. No apparent focal neurological           defect.   MS:   Tender dorsal aspect of right scaphoid. Pain with thumb and wrist extension.      Diabetic Foot Exam - Simple   Simple Foot Form Diabetic Foot exam was performed with the following findings:  Yes 07/05/2017  9:09 AM  Visual Inspection No deformities, no ulcerations, no other skin breakdown bilaterally:  Yes Sensation Testing Intact to touch and monofilament testing bilaterally:  Yes Pulse Check Posterior Tibialis and Dorsalis pulse intact bilaterally:  Yes Comments        Assessment & Plan:     1. Essential (primary) hypertension Well controlled.  Continue  current medications.    2. Coronary artery disease involving native coronary artery of native heart without angina pectoris Asymptomatic. Compliant with medication.  Continue aggressive risk factor modification.  Recommend he discuss SGLT2 inhibitor with cardiology and endocrinology.  - CBC  3. Type 2 diabetes mellitus with stage 2 chronic kidney disease, with long-term current use of insulin ([place]) Continue regular follow up Dr. Gabriel Carina   4. Hyperlipidemia, unspecified hyperlipidemia type He is tolerating rosuvastatin well with no adverse effects.   - COMPLETE METABOLIC PANEL WITH GFR - CBC - Lipid panel  5. Anxiety disorder, unspecified type Doing very well with sertraline which he would like to continue unchanged.   6. Right wrist pain  - DG Wrist Complete Right; Future  The entirety of the information documented in the History of Present Illness, Review of Systems and Physical Exam were personally obtained by me. Portions of this information were initially documented by April M. Sabra Heck, CMA and reviewed by me for thoroughness and accuracy.        Lelon Huh, MD  [facility]

## 2017-07-06 ENCOUNTER — Other Ambulatory Visit: Payer: Self-pay | Admitting: Family Medicine

## 2017-07-06 MED ORDER — METOCLOPRAMIDE HCL 10 MG PO TABS: 10.0000 mg | ORAL_TABLET | Freq: Four times a day (QID) | ORAL | 5 refills | Status: DC | PRN

## 2017-07-06 NOTE — Telephone Encounter (Signed)
Pt contacted office for refill request on the following medications:  metoCLOPramide (REGLAN) 10 MG tablet  [employer].  [phone]

## 2017-07-09 DIAGNOSIS — R809 Proteinuria, unspecified: Secondary | ICD-10-CM | POA: Diagnosis not present

## 2017-07-09 DIAGNOSIS — E1129 Type 2 diabetes mellitus with other diabetic kidney complication: Secondary | ICD-10-CM | POA: Diagnosis not present

## 2017-07-09 DIAGNOSIS — E1165 Type 2 diabetes mellitus with hyperglycemia: Secondary | ICD-10-CM | POA: Diagnosis not present

## 2017-07-09 DIAGNOSIS — E785 Hyperlipidemia, unspecified: Secondary | ICD-10-CM | POA: Diagnosis not present

## 2017-07-09 DIAGNOSIS — Z794 Long term (current) use of insulin: Secondary | ICD-10-CM | POA: Diagnosis not present

## 2017-07-14 DIAGNOSIS — E1129 Type 2 diabetes mellitus with other diabetic kidney complication: Secondary | ICD-10-CM | POA: Diagnosis not present

## 2017-07-14 DIAGNOSIS — Z9641 Presence of insulin pump (external) (internal): Secondary | ICD-10-CM | POA: Diagnosis not present

## 2017-07-14 DIAGNOSIS — E1165 Type 2 diabetes mellitus with hyperglycemia: Secondary | ICD-10-CM | POA: Diagnosis not present

## 2017-07-14 DIAGNOSIS — E1159 Type 2 diabetes mellitus with other circulatory complications: Secondary | ICD-10-CM | POA: Diagnosis not present

## 2017-07-19 DIAGNOSIS — E119 Type 2 diabetes mellitus without complications: Secondary | ICD-10-CM | POA: Diagnosis not present

## 2017-07-22 ENCOUNTER — Other Ambulatory Visit: Payer: Self-pay | Admitting: Family Medicine

## 2017-08-04 ENCOUNTER — Other Ambulatory Visit: Payer: Self-pay | Admitting: Family Medicine

## 2017-08-04 DIAGNOSIS — F410 Panic disorder [episodic paroxysmal anxiety] without agoraphobia: Secondary | ICD-10-CM

## 2017-09-13 ENCOUNTER — Other Ambulatory Visit: Payer: Self-pay | Admitting: Family Medicine

## 2017-09-23 DIAGNOSIS — I10 Essential (primary) hypertension: Secondary | ICD-10-CM | POA: Diagnosis not present

## 2017-09-23 DIAGNOSIS — E1165 Type 2 diabetes mellitus with hyperglycemia: Secondary | ICD-10-CM | POA: Diagnosis not present

## 2017-09-23 DIAGNOSIS — E782 Mixed hyperlipidemia: Secondary | ICD-10-CM | POA: Diagnosis not present

## 2017-09-23 DIAGNOSIS — I2510 Atherosclerotic heart disease of native coronary artery without angina pectoris: Secondary | ICD-10-CM | POA: Diagnosis not present

## 2017-10-07 ENCOUNTER — Telehealth: Payer: Self-pay | Admitting: Family Medicine

## 2017-10-07 MED ORDER — ROSUVASTATIN CALCIUM 40 MG PO TABS: 40.0000 mg | ORAL_TABLET | Freq: Every day | ORAL | 3 refills | Status: DC

## 2017-10-07 NOTE — Telephone Encounter (Signed)
Patient is requesting refill on his rosuvastatin (CRESTOR) 40 MG tablet    [place]

## 2017-10-19 DIAGNOSIS — E1165 Type 2 diabetes mellitus with hyperglycemia: Secondary | ICD-10-CM | POA: Diagnosis not present

## 2017-10-26 DIAGNOSIS — E1129 Type 2 diabetes mellitus with other diabetic kidney complication: Secondary | ICD-10-CM | POA: Diagnosis not present

## 2017-10-26 DIAGNOSIS — Z9641 Presence of insulin pump (external) (internal): Secondary | ICD-10-CM | POA: Diagnosis not present

## 2017-10-26 DIAGNOSIS — E1165 Type 2 diabetes mellitus with hyperglycemia: Secondary | ICD-10-CM | POA: Diagnosis not present

## 2017-10-26 DIAGNOSIS — E1159 Type 2 diabetes mellitus with other circulatory complications: Secondary | ICD-10-CM | POA: Diagnosis not present

## 2018-01-31 DIAGNOSIS — E1165 Type 2 diabetes mellitus with hyperglycemia: Secondary | ICD-10-CM | POA: Diagnosis not present

## 2018-02-02 DIAGNOSIS — E1159 Type 2 diabetes mellitus with other circulatory complications: Secondary | ICD-10-CM | POA: Diagnosis not present

## 2018-02-02 DIAGNOSIS — R809 Proteinuria, unspecified: Secondary | ICD-10-CM | POA: Diagnosis not present

## 2018-02-02 DIAGNOSIS — Z9641 Presence of insulin pump (external) (internal): Secondary | ICD-10-CM | POA: Diagnosis not present

## 2018-02-02 DIAGNOSIS — E1129 Type 2 diabetes mellitus with other diabetic kidney complication: Secondary | ICD-10-CM | POA: Diagnosis not present

## 2018-02-03 DIAGNOSIS — E119 Type 2 diabetes mellitus without complications: Secondary | ICD-10-CM | POA: Diagnosis not present

## 2018-03-22 DIAGNOSIS — E782 Mixed hyperlipidemia: Secondary | ICD-10-CM | POA: Diagnosis not present

## 2018-03-22 DIAGNOSIS — I2510 Atherosclerotic heart disease of native coronary artery without angina pectoris: Secondary | ICD-10-CM | POA: Diagnosis not present

## 2018-03-22 DIAGNOSIS — I6523 Occlusion and stenosis of bilateral carotid arteries: Secondary | ICD-10-CM | POA: Diagnosis not present

## 2018-03-22 DIAGNOSIS — I10 Essential (primary) hypertension: Secondary | ICD-10-CM | POA: Diagnosis not present

## 2018-04-20 ENCOUNTER — Ambulatory Visit (INDEPENDENT_AMBULATORY_CARE_PROVIDER_SITE_OTHER): Payer: BLUE CROSS/BLUE SHIELD | Admitting: Family Medicine

## 2018-04-20 ENCOUNTER — Encounter: Payer: Self-pay | Admitting: Family Medicine

## 2018-04-20 VITALS — BP 134/82 | HR 82 | Temp 97.7°F | Resp 16 | Ht 70.0 in | Wt 236.0 lb

## 2018-04-20 DIAGNOSIS — Z23 Encounter for immunization: Secondary | ICD-10-CM | POA: Diagnosis not present

## 2018-04-20 DIAGNOSIS — E1122 Type 2 diabetes mellitus with diabetic chronic kidney disease: Secondary | ICD-10-CM

## 2018-04-20 DIAGNOSIS — Z125 Encounter for screening for malignant neoplasm of prostate: Secondary | ICD-10-CM | POA: Diagnosis not present

## 2018-04-20 DIAGNOSIS — Z0000 Encounter for general adult medical examination without abnormal findings: Secondary | ICD-10-CM | POA: Diagnosis not present

## 2018-04-20 DIAGNOSIS — Z1211 Encounter for screening for malignant neoplasm of colon: Secondary | ICD-10-CM | POA: Diagnosis not present

## 2018-04-20 DIAGNOSIS — R102 Pelvic and perineal pain: Secondary | ICD-10-CM

## 2018-04-20 DIAGNOSIS — Z794 Long term (current) use of insulin: Secondary | ICD-10-CM

## 2018-04-20 DIAGNOSIS — E785 Hyperlipidemia, unspecified: Secondary | ICD-10-CM

## 2018-04-20 DIAGNOSIS — N182 Chronic kidney disease, stage 2 (mild): Secondary | ICD-10-CM

## 2018-04-20 LAB — POCT URINALYSIS DIPSTICK
BILIRUBIN UA: NEGATIVE
Blood, UA: NEGATIVE
Glucose, UA: POSITIVE — AB
KETONES UA: NEGATIVE
Leukocytes, UA: NEGATIVE
Nitrite, UA: NEGATIVE
Protein, UA: NEGATIVE
Spec Grav, UA: 1.02 (ref 1.010–1.025)
UROBILINOGEN UA: 1 U/dL
pH, UA: 6 (ref 5.0–8.0)

## 2018-04-20 NOTE — Progress Notes (Signed)
Patient: Alex [name], Male    DOB: 02-19-1966, 52 y.o.   MRN: [id number] Visit Date: 04/20/2018  Today's Provider: Lelon Huh, MD   Chief Complaint  Patient presents with  . Annual Exam  . Diabetes  . Hyperlipidemia  . Anxiety   Subjective:    Annual physical exam Alex [name] is a 52 y.o. male who presents today for health maintenance and complete physical. He feels fairly well. He reports no regular exercising. He reports he is sleeping fairly well.  -----------------------------------------------------------------  Diabetes Mellitus Type II, Follow-up:   Lab Results  Component Value Date   HGBA1C 7.3 01/06/2017    Last seen for diabetes 10 months ago.  Management since then includes no changes. This problem is managed by Dr. Gabriel Carina. .    Pertinent Labs:    Component Value Date/Time   CHOL 176 09/17/2014   TRIG 149 09/17/2014   HDL 35 09/17/2014   LDLCALC 111 09/17/2014    Wt Readings from Last 3 Encounters:  04/20/18 236 lb (107 kg)  07/05/17 243 lb (110.2 kg)  08/11/16 236 lb (107 kg)    ------------------------------------------------------------------------  Lipid/Cholesterol, Follow-up:   Last seen for this10 months ago.  Management changes since that visit include none. . Last Lipid Panel:    Component Value Date/Time   CHOL 176 09/17/2014   TRIG 149 09/17/2014   HDL 35 09/17/2014   LDLCALC 111 09/17/2014    Risk factors for vascular disease include diabetes mellitus and hypercholesterolemia  He reports good compliance with treatment. He is not having side effects.  Current symptoms include none and have been stable. Weight trend: fluctuating a bit Prior visit with dietician: no Current diet: well balanced Current exercise: none  Wt Readings from Last 3 Encounters:  04/20/18 236 lb (107 kg)  07/05/17 243 lb (110.2 kg)  08/11/16 236 lb (107 kg)     ------------------------------------------------------------------- Follow up of Anxiety: Patient was last seen for this problem 10 months ago and no changes were made. Patient reports good compliance with treatment,. Good tolerance and good symptom control.    Hypertension, follow-up:  BP Readings from Last 3 Encounters:  04/20/18 134/82  07/05/17 120/80  08/11/16 136/84    He was last seen for hypertension 10 months ago.  BP at that visit was 120/80. Management since that visit includes no changes. He reports good compliance with treatment. He is not having side effects.  He is not exercising. He is adherent to low salt diet.   Outside blood pressures are not being checked. He is experiencing none.  Patient denies chest pain, chest pressure/discomfort, claudication, dyspnea, exertional chest pressure/discomfort, fatigue, irregular heart beat, lower extremity edema, near-syncope, orthopnea, palpitations, paroxysmal nocturnal dyspnea, syncope and tachypnea.   Cardiovascular risk factors include diabetes mellitus, dyslipidemia, hypertension and male gender.  Use of agents associated with hypertension: NSAIDS.     Weight trend: fluctuating a bit Wt Readings from Last 3 Encounters:  04/20/18 236 lb (107 kg)  07/05/17 243 lb (110.2 kg)  08/11/16 236 lb (107 kg)    Current diet: well balanced  ------------------------------------------------------------------------   Review of Systems  Constitutional: Negative for appetite change, chills, fatigue and fever.  HENT: Negative for congestion, ear pain, hearing loss, nosebleeds and trouble swallowing.   Eyes: Negative for pain and visual disturbance.  Respiratory: Negative for cough, chest tightness and shortness of breath.   Cardiovascular: Negative for chest pain, palpitations and leg swelling.  Gastrointestinal: Negative  for abdominal pain, blood in stool, constipation, diarrhea, nausea and vomiting.  Endocrine: Negative  for polydipsia, polyphagia and polyuria.  Genitourinary: Negative for dysuria and flank pain.  Musculoskeletal: Negative for arthralgias, back pain, joint swelling, myalgias and neck stiffness.  Skin: Negative for color change, rash and wound.  Neurological: Negative for dizziness, tremors, seizures, speech difficulty, weakness, light-headedness and headaches.  Psychiatric/Behavioral: Negative for behavioral problems, confusion, decreased concentration, dysphoric mood and sleep disturbance. The patient is not nervous/anxious.   All other systems reviewed and are negative.   Social History      He  reports that he quit smoking about 11 years ago. His smoking use included cigarettes. He has a 30.00 pack-year smoking history. He has never used smokeless tobacco. He reports that he does not drink alcohol or use drugs.       Social History   Socioeconomic History  . Marital status: Married    Spouse name: Not on file  . Number of children: Not on file  . Years of education: Not on file  . Highest education level: Not on file  Occupational History  . Occupation: Engineer, site  Social Needs  . Financial resource strain: Not on file  . Food insecurity:    Worry: Not on file    Inability: Not on file  . Transportation needs:    Medical: Not on file    Non-medical: Not on file  Tobacco Use  . Smoking status: Former Smoker    Packs/day: 1.50    Years: 20.00    Pack years: 30.00    Types: Cigarettes    Last attempt to quit: 08/10/2006    Years since quitting: 11.7  . Smokeless tobacco: Never Used  Substance and Sexual Activity  . Alcohol use: No    Alcohol/week: 0.0 standard drinks  . Drug use: No  . Sexual activity: Not on file  Lifestyle  . Physical activity:    Days per week: Not on file    Minutes per session: Not on file  . Stress: Not on file  Relationships  . Social connections:    Talks on phone: Not on file    Gets together: Not on file    Attends religious  service: Not on file    Active member of club or organization: Not on file    Attends meetings of clubs or organizations: Not on file    Relationship status: Not on file  Other Topics Concern  . Not on file  Social History Narrative  . Not on file    Past Medical History:  Diagnosis Date  . Allergy   . Diabetes mellitus without complication ([place])   . GERD (gastroesophageal reflux disease)   . History of chicken pox   . Hyperlipidemia   . Hypertension   . Panic disorder      Patient Active Problem List   Diagnosis Date Noted  . Morbid obesity ([place]) 07/05/2017  . Gallbladder polyp 01/20/2016  . Allergic rhinitis 01/20/2016  . Fatigue 01/20/2016  . Other long term (current) drug therapy 01/20/2016  . Microalbuminuria 08/07/2014  . Carotid atherosclerosis 11/01/2013  . History of shingles 03/06/2011  . Anxiety disorder 10/08/2009  . 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 Type 2 diabetes mellitus with diabetic chronic kidney disease ([place]) 12/05/2008  . Essential (primary) hypertension 12/05/2008  . GERD (gastroesophageal reflux disease) 12/05/2008  . Panic disorder 08/11/2007  . Old myocardial infarct 02/08/2007  . HLD (hyperlipidemia) 03/24/2006  . Abnormal  LFTs 08/10/2005  . CAD (coronary artery disease) 08/11/2003    Past Surgical History:  Procedure Laterality Date  . heart artery stent  2008  . KNEE SURGERY    . Myocrasial Perfusion Scan  09/13/2011   Abnormal images. Not thought to be significant per Dr. Nehemiah Massed    Family History        Family Status  Relation Name Status  . Mother  Alive  . Father  Deceased at age 61       Sclerosis  . Brother  Alive  . Other family hx (Not Specified)        His family history includes Arthritis in his other; Diabetes in his mother and other; Heart disease in his other; Hyperlipidemia in his mother; Hypertension in his mother and other.      Allergies  Allergen Reactions  . Aleve  [Naproxen Sodium]   . Mobic   [Meloxicam]   . Voltaren  [Diclofenac Sodium]      Current Outpatient Medications:  .  aspirin 81 MG tablet, , Disp: , Rfl:  .  clopidogrel (PLAVIX) 75 MG tablet, TAKE 1 TABLET BY MOUTH EVERY DAY, Disp: 90 tablet, Rfl: 4 .  Coenzyme Q10 (COQ10) 200 MG CAPS, Take 1 capsule by mouth 2 (two) times daily., Disp: , Rfl:  .  Cyanocobalamin (VITAMIN B 12) 250 MCG LOZG, Take by mouth., Disp: , Rfl:  .  glucose blood test strip, , Disp: , Rfl:  .  insulin regular human CONCENTRATED (HUMULIN R) 500 UNIT/ML injection, Use up to 250 units (as calculated by U100 syringe) daily via insulin pump, Disp: , Rfl:  .  lisinopril (PRINIVIL,ZESTRIL) 10 MG tablet, Take 1 tablet (10 mg total) by mouth daily., Disp: 90 tablet, Rfl: 1 .  LORazepam (ATIVAN) 0.5 MG tablet, Take by mouth. 1-2 every 6 hours as needed, Disp: , Rfl:  .  metFORMIN (GLUCOPHAGE) 500 MG tablet, Take 500 mg by mouth 2 (two) times daily. , Disp: , Rfl:  .  metoCLOPramide (REGLAN) 10 MG tablet, Take 1 tablet (10 mg total) by mouth 4 (four) times daily as needed., Disp: 30 tablet, Rfl: 5 .  Multiple Vitamin (MULTIVITAMIN) tablet, Take 1 tablet by mouth daily., Disp: , Rfl:  .  Omega-3 Fatty Acids (FISH OIL) 1000 MG CAPS, Take 1 capsule by mouth 2 (two) times daily. , Disp: , Rfl:  .  pantoprazole (PROTONIX) 40 MG tablet, take 1 tablet by mouth once daily, Disp: 90 tablet, Rfl: 3 .  rosuvastatin (CRESTOR) 40 MG tablet, Take 1 tablet (40 mg total) by mouth daily., Disp: 90 tablet, Rfl: 3 .  sertraline (ZOLOFT) 100 MG tablet, take 1 tablet by mouth once daily, Disp: 90 tablet, Rfl: 4 .  fluticasone (FLONASE) 50 MCG/ACT nasal spray, Place into the nose., Disp: , Rfl:    Patient Care Team: Birdie Sons, MD as PCP - General (Family Medicine) Corey Skains, MD as Consulting Physician (Internal Medicine) Gabriel Carina Betsey Holiday, MD as Physician Assistant (Internal Medicine)      Objective:   Vitals: BP 134/82 (BP Location: Left Arm, Patient Position:  Sitting, Cuff Size: Large)   Pulse 82   Temp 97.7 F (36.5 C) (Oral)   Resp 16   Ht 5\' 10"  (1.778 m)   Wt 236 lb (107 kg)   SpO2 96% Comment: room air  BMI 33.86 kg/m    Vitals:   04/20/18 1412  BP: 134/82  Pulse: 82  Resp: 16  Temp:  97.7 F (36.5 C)  TempSrc: Oral  SpO2: 96%  Weight: 236 lb (107 kg)  Height: 5\' 10"  (1.778 m)     Physical Exam   General Appearance:    Alert, cooperative, no distress, appears stated age  Head:    Normocephalic, without obvious abnormality, atraumatic  Eyes:    PERRL, conjunctiva/corneas clear, EOM's intact, fundi    benign, both eyes       Ears:    Normal TM's and external ear canals, both ears  Nose:   Nares normal, septum midline, mucosa normal, no drainage   or sinus tenderness  Throat:   Lips, mucosa, and tongue normal; teeth and gums normal  Neck:   Supple, symmetrical, trachea midline, no adenopathy;       thyroid:  No enlargement/tenderness/nodules; no carotid   bruit or JVD  Back:     Symmetric, no curvature, ROM normal, no CVA tenderness  Lungs:     Clear to auscultation bilaterally, respirations unlabored  Chest wall:    No tenderness or deformity  Heart:    Regular rate and rhythm, S1 and S2 normal, no murmur, rub   or gallop  Abdomen:     Soft, non-tender, bowel sounds active all four quadrants,    no masses, no organomegaly  Genitalia:    deferred  Rectal:    deferred  Extremities:   Extremities normal, atraumatic, no cyanosis or edema  Pulses:   2+ and symmetric all extremities  Skin:   Skin color, texture, turgor normal, no rashes or lesions  Lymph nodes:   Cervical, supraclavicular, and axillary nodes normal  Neurologic:   CNII-XII intact. Normal strength, sensation and reflexes      throughout    Depression Screen PHQ 2/9 Scores 07/05/2017  PHQ - 2 Score 0  PHQ- 9 Score 0      Assessment & Plan:     Routine Health Maintenance and Physical Exam  Exercise Activities and Dietary recommendations Goals    None     Immunization History  Administered Date(s) Administered  . Influenza-Unspecified 04/28/2017  . Pneumococcal Polysaccharide-23 07/23/2011  . Tdap 07/23/2011    Health Maintenance  Topic Date Due  . OPHTHALMOLOGY EXAM  09/18/1977  . HIV Screening  09/18/1982  . HEMOGLOBIN A1C  07/09/2017  . COLONOSCOPY  09/18/2017  . INFLUENZA VACCINE  03/10/2018  . FOOT EXAM  07/05/2018  . TETANUS/TDAP  07/22/2021  . PNEUMOCOCCAL POLYSACCHARIDE VACCINE AGE 64-64 HIGH RISK  Completed     Discussed health benefits of physical activity, and encouraged him to engage in regular exercise appropriate for his age and condition.    --------------------------------------------------------------------  1. Annual physical exam   2. Need for influenza vaccination  - Flu Vaccine QUAD 6+ mos PF IM (Fluarix Quad PF)  3. Screening for colon cancer  - Cologuard  4. Type 2 diabetes mellitus with stage 2 chronic kidney disease, with long-term current use of insulin (HCC) Continue follow up Dr. Gabriel Carina   5. Hyperlipidemia, unspecified hyperlipidemia type He is tolerating rosuvastatin well with no adverse effects.    6. Prostate cancer screening  - PSA  7. Need for shingles vaccine  - Varicella-zoster vaccine IM  8. Pelvic pain He describes vague sensation of intra penile pain worse when urinating. Consider empiric antibiotic after reviewing labs.  - POCT Urinalysis Dipstick    Lelon Huh, MD  [facility]

## 2018-04-20 NOTE — Patient Instructions (Signed)
   Please contact your eyecare professional to schedule a routine eye exam  

## 2018-04-21 ENCOUNTER — Telehealth: Payer: Self-pay

## 2018-04-21 LAB — PSA: Prostate Specific Ag, Serum: 0.8 ng/mL (ref 0.0–4.0)

## 2018-04-21 MED ORDER — CIPROFLOXACIN HCL 500 MG PO TABS: 500.0000 mg | ORAL_TABLET | Freq: Two times a day (BID) | ORAL | 0 refills | Status: DC

## 2018-04-21 NOTE — Telephone Encounter (Signed)
-----   Message from Birdie Sons, MD sent at 04/21/2018  7:55 AM EDT ----- PSA test is normal. Recommend he take ciprofloxacin 500mg  BID x 10 days for pelvic pain. Let me know if not better when finished.

## 2018-04-21 NOTE — Telephone Encounter (Signed)
Patient advised and agrees with treatment plan. Prescription sent into the pharmacy.

## 2018-05-05 DIAGNOSIS — E119 Type 2 diabetes mellitus without complications: Secondary | ICD-10-CM | POA: Diagnosis not present

## 2018-05-11 DIAGNOSIS — E1159 Type 2 diabetes mellitus with other circulatory complications: Secondary | ICD-10-CM | POA: Diagnosis not present

## 2018-05-18 DIAGNOSIS — E1159 Type 2 diabetes mellitus with other circulatory complications: Secondary | ICD-10-CM | POA: Diagnosis not present

## 2018-05-18 DIAGNOSIS — Z9641 Presence of insulin pump (external) (internal): Secondary | ICD-10-CM | POA: Diagnosis not present

## 2018-05-18 DIAGNOSIS — E1129 Type 2 diabetes mellitus with other diabetic kidney complication: Secondary | ICD-10-CM | POA: Diagnosis not present

## 2018-05-18 DIAGNOSIS — R809 Proteinuria, unspecified: Secondary | ICD-10-CM | POA: Diagnosis not present

## 2018-05-30 ENCOUNTER — Other Ambulatory Visit: Payer: Self-pay | Admitting: Family Medicine

## 2018-06-10 ENCOUNTER — Encounter: Payer: Self-pay | Admitting: Family Medicine

## 2018-06-10 ENCOUNTER — Ambulatory Visit (INDEPENDENT_AMBULATORY_CARE_PROVIDER_SITE_OTHER): Payer: BLUE CROSS/BLUE SHIELD | Admitting: Family Medicine

## 2018-06-10 VITALS — BP 128/76 | HR 100 | Temp 99.1°F | Resp 16 | Ht 71.0 in | Wt 234.0 lb

## 2018-06-10 DIAGNOSIS — R102 Pelvic and perineal pain: Secondary | ICD-10-CM | POA: Diagnosis not present

## 2018-06-10 MED ORDER — PREDNISONE 10 MG PO TABS: ORAL_TABLET | ORAL | 0 refills | Status: DC

## 2018-06-10 NOTE — Progress Notes (Signed)
Patient: Alex [name] Male    DOB: 31-Jan-1966   52 y.o.   MRN: [id number] Visit Date: 06/10/2018  Today's Provider: Lelon Huh, MD   Chief Complaint  Patient presents with  . Follow-up   Subjective:    HPI  Patient comes in today for a follow up. He was seen in the office on 04/20/2018 for CPE and c/o pelvic pain. PSA was normal. He was started on cipro 500mg  BID to cover for prostatitis, but reports no improvement at all on antibiotic. Denies urinary hesitancy, frequency or urgency. Pain occurs when sitting down and improves almost immediately when he stands up. Does not tolerate NSAIDs.     Allergies  Allergen Reactions  . Aleve  [Naproxen Sodium]   . Mobic  [Meloxicam]   . Voltaren  [Diclofenac Sodium]      Current Outpatient Medications:  .  aspirin 81 MG tablet, , Disp: , Rfl:  .  clopidogrel (PLAVIX) 75 MG tablet, TAKE 1 TABLET BY MOUTH EVERY DAY, Disp: 90 tablet, Rfl: 4 .  Coenzyme Q10 (COQ10) 200 MG CAPS, Take 1 capsule by mouth 2 (two) times daily., Disp: , Rfl:  .  Cyanocobalamin (VITAMIN B 12) 250 MCG LOZG, Take by mouth., Disp: , Rfl:  .  fluticasone (FLONASE) 50 MCG/ACT nasal spray, Place into the nose., Disp: , Rfl:  .  glucose blood test strip, , Disp: , Rfl:  .  insulin regular human CONCENTRATED (HUMULIN R) 500 UNIT/ML injection, Use up to 250 units (as calculated by U100 syringe) daily via insulin pump, Disp: , Rfl:  .  lisinopril (PRINIVIL,ZESTRIL) 10 MG tablet, Take 1 tablet (10 mg total) by mouth daily., Disp: 90 tablet, Rfl: 1 .  LORazepam (ATIVAN) 0.5 MG tablet, Take by mouth. 1-2 every 6 hours as needed, Disp: , Rfl:  .  metFORMIN (GLUCOPHAGE) 500 MG tablet, Take 500 mg by mouth 2 (two) times daily. , Disp: , Rfl:  .  metoCLOPramide (REGLAN) 10 MG tablet, Take 1 tablet (10 mg total) by mouth 4 (four) times daily as needed., Disp: 30 tablet, Rfl: 5 .  Multiple Vitamin (MULTIVITAMIN) tablet, Take 1 tablet by mouth daily., Disp: , Rfl:  .  Omega-3  Fatty Acids (FISH OIL) 1000 MG CAPS, Take 1 capsule by mouth 2 (two) times daily. , Disp: , Rfl:  .  pantoprazole (PROTONIX) 40 MG tablet, TAKE 1 TABLET BY MOUTH EVERY DAY, Disp: 90 tablet, Rfl: 4 .  rosuvastatin (CRESTOR) 40 MG tablet, Take 1 tablet (40 mg total) by mouth daily., Disp: 90 tablet, Rfl: 3 .  sertraline (ZOLOFT) 100 MG tablet, take 1 tablet by mouth once daily, Disp: 90 tablet, Rfl: 4 .  ciprofloxacin (CIPRO) 500 MG tablet, Take 1 tablet (500 mg total) by mouth 2 (two) times daily. (Patient not taking: Reported on 06/10/2018), Disp: 20 tablet, Rfl: 0  Review of Systems  Constitutional: Negative for activity change, appetite change, diaphoresis and fatigue.  Respiratory: Negative for cough and shortness of breath.   Genitourinary: Positive for penile pain and testicular pain. Negative for difficulty urinating, discharge, flank pain, frequency, genital sores, hematuria, penile swelling and urgency.    Social History   Tobacco Use  . Smoking status: Former Smoker    Packs/day: 1.50    Years: 20.00    Pack years: 30.00    Types: Cigarettes    Last attempt to quit: 08/10/2006    Years since quitting: 11.8  . Smokeless tobacco: Never  Used  Substance Use Topics  . Alcohol use: No    Alcohol/week: 0.0 standard drinks   Objective:   BP 128/76 (BP Location: Left Arm, Patient Position: Sitting, Cuff Size: Large)   Pulse 100   Temp 99.1 F (37.3 C)   Resp 16   Ht 5\' 11"  (1.803 m)   Wt 234 lb (106.1 kg)   SpO2 96%   BMI 32.64 kg/m  Vitals:   06/10/18 1620  BP: 128/76  Pulse: 100  Resp: 16  Temp: 99.1 F (37.3 C)  SpO2: 96%  Weight: 234 lb (106.1 kg)  Height: 5\' 11"  (1.803 m)     Physical Exam  General appearance: alert, well developed, well nourished, cooperative and in no distress Head: Normocephalic, without obvious abnormality, atraumatic Respiratory: Respirations even and unlabored, normal respiratory rate Extremities: No gross deformities  GU: no hernias,  masses or swelling. Tender over symphysis pubis.      Assessment & Plan:     1. Pelvic pain No improvement with ciprofloxacin prescribed for suspected prostatitis. Tenderness is over syphisis pubis. He does not tolerated nsaid and high dose prednisone increases his blood sugar. Will try low dose prednisone, 20 for one day then 10 a day for the next two weeks. Consider xr and urology referral if not better.        Lelon Huh, MD  [facility]

## 2018-06-10 NOTE — Patient Instructions (Addendum)
   Call for x-ray order and referral to urologist if not greatly improved when finished with prednisone

## 2018-06-22 ENCOUNTER — Telehealth: Payer: Self-pay | Admitting: Family Medicine

## 2018-06-22 ENCOUNTER — Ambulatory Visit (INDEPENDENT_AMBULATORY_CARE_PROVIDER_SITE_OTHER): Payer: BLUE CROSS/BLUE SHIELD | Admitting: Family Medicine

## 2018-06-22 DIAGNOSIS — R102 Pelvic and perineal pain: Secondary | ICD-10-CM

## 2018-06-22 DIAGNOSIS — Z23 Encounter for immunization: Secondary | ICD-10-CM

## 2018-06-22 NOTE — Telephone Encounter (Signed)
Order entered, no appointment necessary

## 2018-06-22 NOTE — Telephone Encounter (Signed)
Pt advised.  He decided to go ahead and do the X-ray tomorrow.  Please place the order.   Thanks,   -Mickel Baas

## 2018-06-22 NOTE — Telephone Encounter (Signed)
-----   Message from Birdie Sons, MD sent at 06/10/2018  4:52 PM EDT ----- Call before November 14th to see if pubic pain is better. xr if not.

## 2018-06-22 NOTE — Telephone Encounter (Signed)
That's fine. Let me know next week, if not continuing to improve can order xray then.

## 2018-06-22 NOTE — Telephone Encounter (Signed)
Spoke with pt.  He reports his pelvic pain is about 60% improved. He states the he only has a "Dull Achy" pain instead of the "T[place]in [place], Shooting" pain when he came in.  He denies any urinary symptoms, fevers.  He was wanting to know if he could have extend the prednisone longer.  He will finish on Saturday.  He agree to the Xray but would like to wait a little longer to see if the pain completely goes away.   Please advise.   Thanks,   -Mickel Baas

## 2018-06-22 NOTE — Telephone Encounter (Signed)
Patent is coming in this morning for shingrix. Please see if he is still having pelvic pain that he was seen for 11/1. If not better then will need to order pelvic xray.

## 2018-06-23 ENCOUNTER — Ambulatory Visit
Admission: RE | Admit: 2018-06-23 | Discharge: 2018-06-23 | Disposition: A | Discharge status: Home / Self Care | Payer: BLUE CROSS/BLUE SHIELD | Source: Ambulatory Visit | Attending: Family Medicine | Admitting: Family Medicine

## 2018-06-23 DIAGNOSIS — M24851 Other specific joint derangements of right hip, not elsewhere classified: Secondary | ICD-10-CM | POA: Diagnosis not present

## 2018-06-23 DIAGNOSIS — R102 Pelvic and perineal pain: Secondary | ICD-10-CM | POA: Insufficient documentation

## 2018-06-23 DIAGNOSIS — M160 Bilateral primary osteoarthritis of hip: Secondary | ICD-10-CM | POA: Diagnosis not present

## 2018-06-23 DIAGNOSIS — M24852 Other specific joint derangements of left hip, not elsewhere classified: Secondary | ICD-10-CM | POA: Insufficient documentation

## 2018-06-23 NOTE — Telephone Encounter (Signed)
Patient was advised.  

## 2018-06-28 ENCOUNTER — Telehealth: Payer: Self-pay

## 2018-06-28 MED ORDER — PREDNISONE 10 MG PO TABS: 10.0000 mg | ORAL_TABLET | Freq: Every day | ORAL | 0 refills | Status: DC

## 2018-06-28 NOTE — Telephone Encounter (Signed)
-----   Message from Birdie Sons, MD sent at 06/27/2018  1:04 PM EST ----- xrays are normal.  i'm not sure if pain is coming from prostate or pelvic bones, but nothing shows up on xray. If not better then recommend referral to urology

## 2018-06-28 NOTE — Telephone Encounter (Signed)
Pt advised.  He is still having some pain but has improved.  Before doing the urology referral he would like to try another round of prednisone if you agree.  Pt uses [facility].    Thanks,   -Mickel Baas

## 2018-06-28 NOTE — Telephone Encounter (Signed)
OK,. Have sent prescription for another 2 weeks of prednisone

## 2018-06-28 NOTE — Telephone Encounter (Signed)
Pt advised.   Thanks,   -Morrigan Wickens  

## 2018-06-30 ENCOUNTER — Telehealth: Payer: Self-pay | Admitting: Family Medicine

## 2018-06-30 MED ORDER — PREDNISONE 10 MG PO TABS: 10.0000 mg | ORAL_TABLET | Freq: Every day | ORAL | 0 refills | Status: AC

## 2018-06-30 NOTE — Telephone Encounter (Signed)
Pt's pharmacy will not fill the medication.  They are telling him they need to clarify the directions.  They are saying there are 2 different directions for the predniSONE (DELTASONE) 10 MG tablet.  Please call pharmacy to clarify. [id number] - [place], [place] - [address] [phone] (Phone) [phone] (Fax)     Thanks, [facility]

## 2018-06-30 NOTE — Telephone Encounter (Signed)
Have resent prescription with clarification to [facility].

## 2018-06-30 NOTE — Telephone Encounter (Signed)
Please advise 

## 2018-07-19 DIAGNOSIS — E119 Type 2 diabetes mellitus without complications: Secondary | ICD-10-CM | POA: Diagnosis not present

## 2018-08-11 ENCOUNTER — Other Ambulatory Visit: Payer: Self-pay | Admitting: Family Medicine

## 2018-08-11 DIAGNOSIS — F410 Panic disorder [episodic paroxysmal anxiety] without agoraphobia: Secondary | ICD-10-CM

## 2018-08-18 DIAGNOSIS — E1159 Type 2 diabetes mellitus with other circulatory complications: Secondary | ICD-10-CM | POA: Diagnosis not present

## 2018-08-24 DIAGNOSIS — Z794 Long term (current) use of insulin: Secondary | ICD-10-CM | POA: Diagnosis not present

## 2018-08-24 DIAGNOSIS — R809 Proteinuria, unspecified: Secondary | ICD-10-CM | POA: Diagnosis not present

## 2018-08-24 DIAGNOSIS — E1129 Type 2 diabetes mellitus with other diabetic kidney complication: Secondary | ICD-10-CM | POA: Diagnosis not present

## 2018-08-31 IMAGING — CR DG WRIST COMPLETE 3+V*R*
1 series · 4 of 4 positions shown · non-contrast
Comparison: None.

CLINICAL DATA: Wrist injury 2 months ago, persistent pain, initial
encounter.

EXAM:
RIGHT WRIST - COMPLETE 3+ VIEW

[Series 1: dg wrist complete right · 0.14mm/px · 4 of 4 slices shown]
[im 1/4]
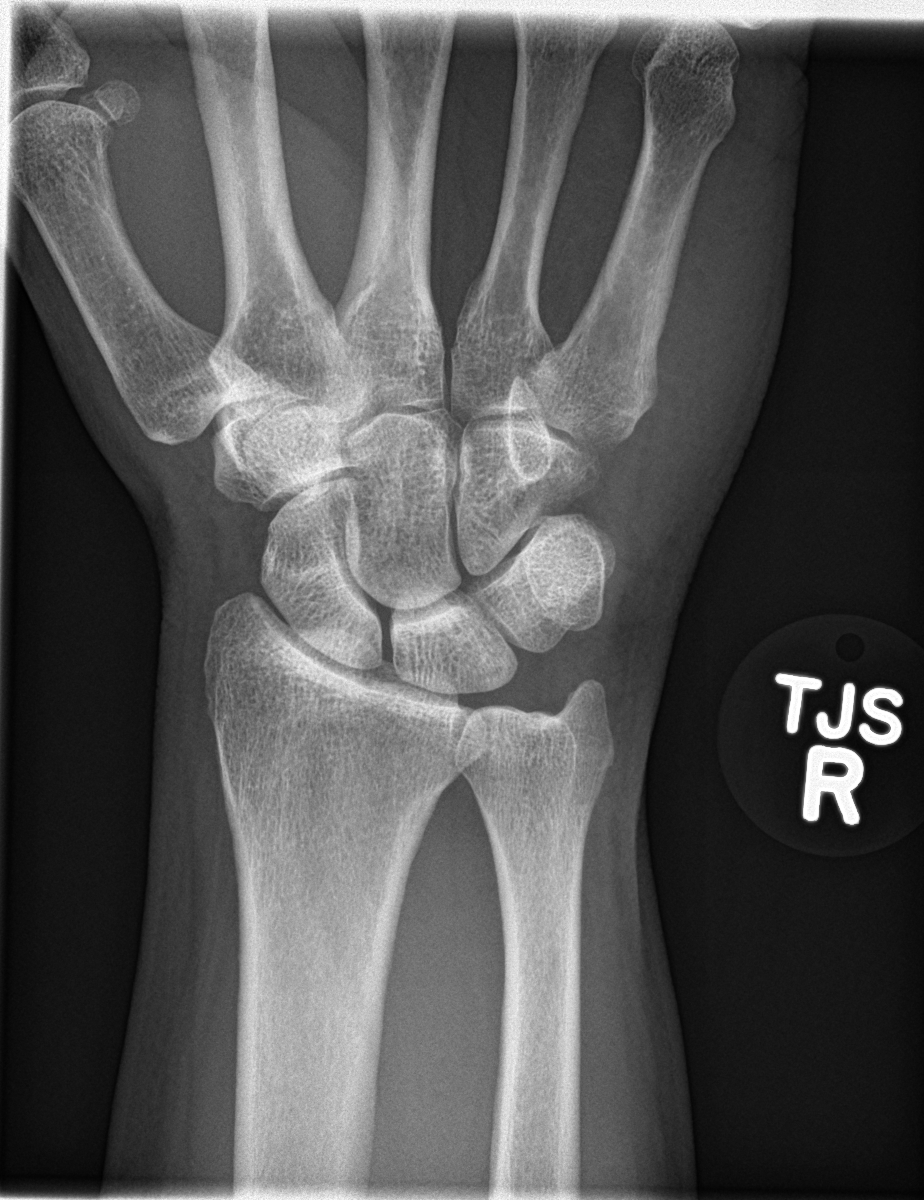
[im 2/4]
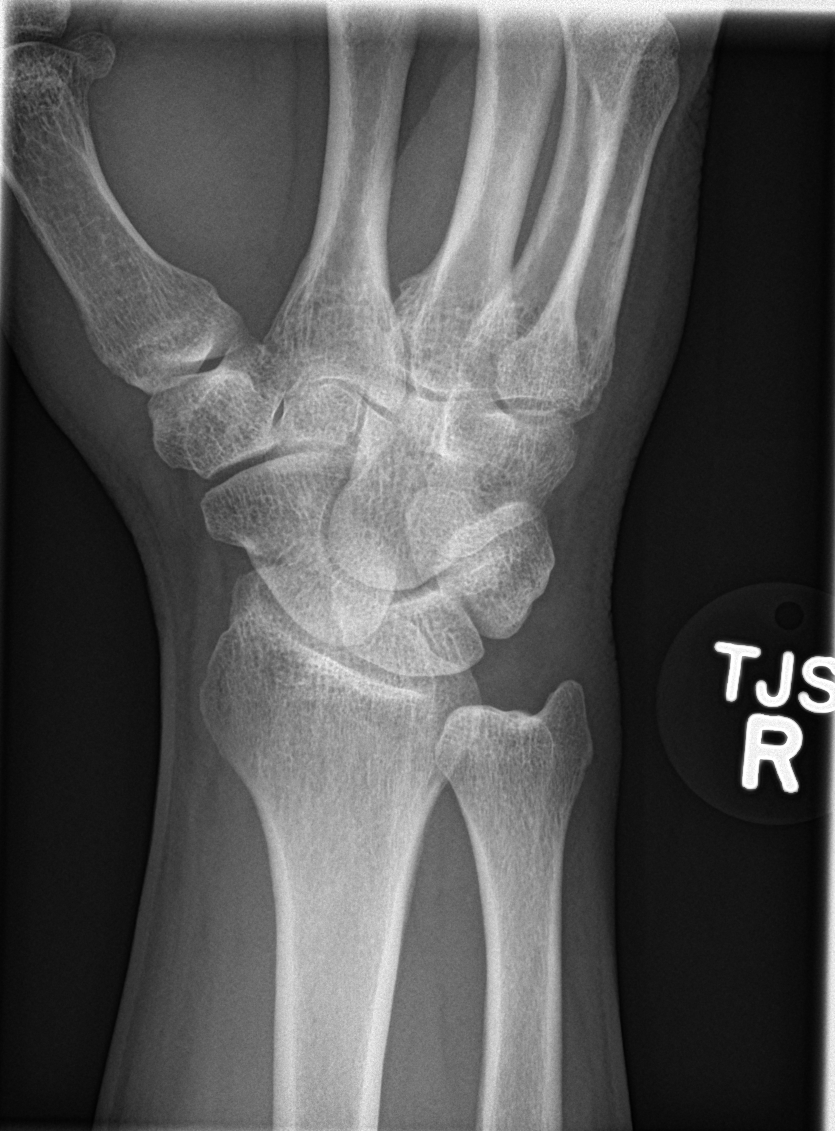
[im 3/4]
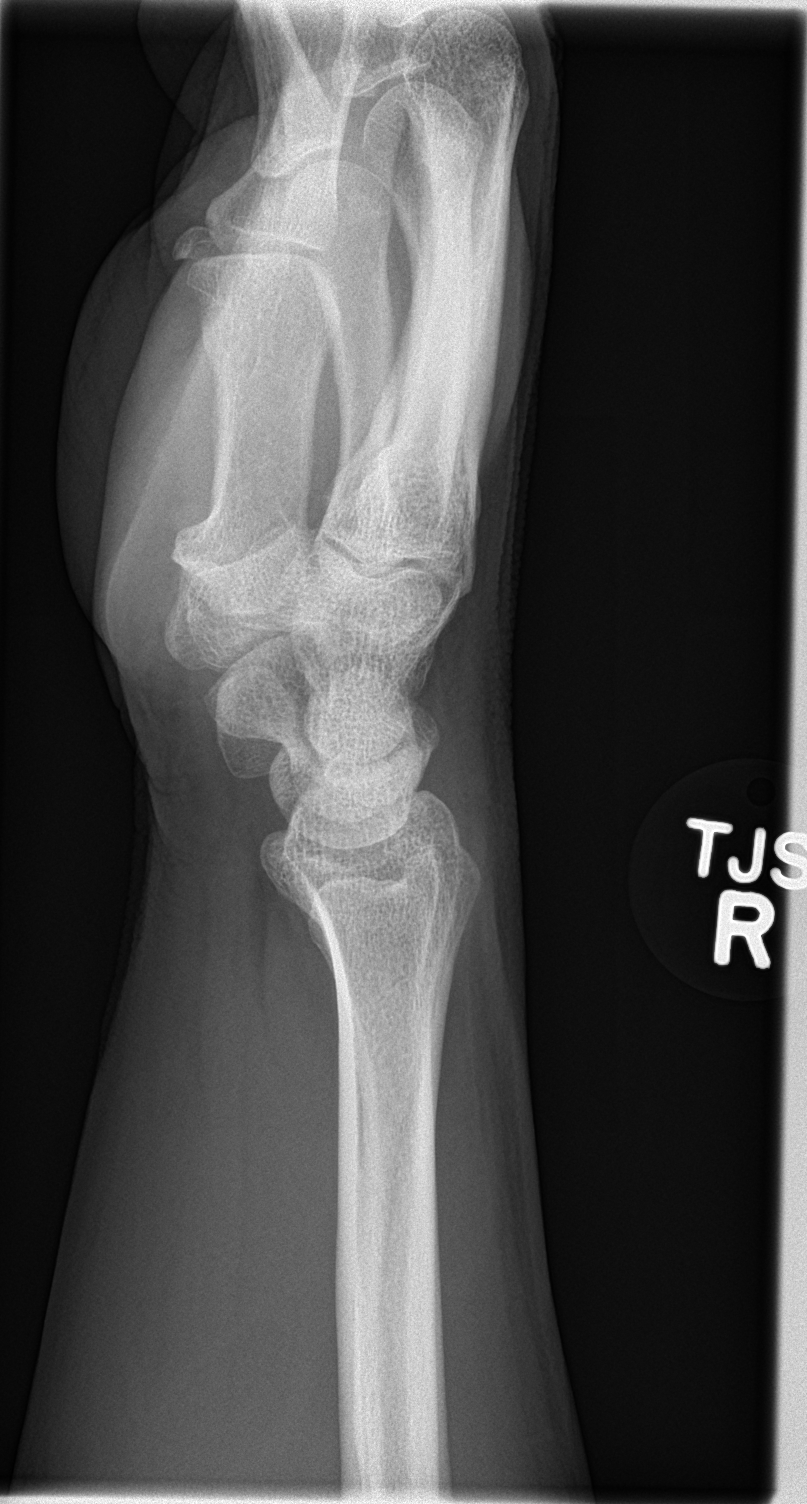
[im 4/4]
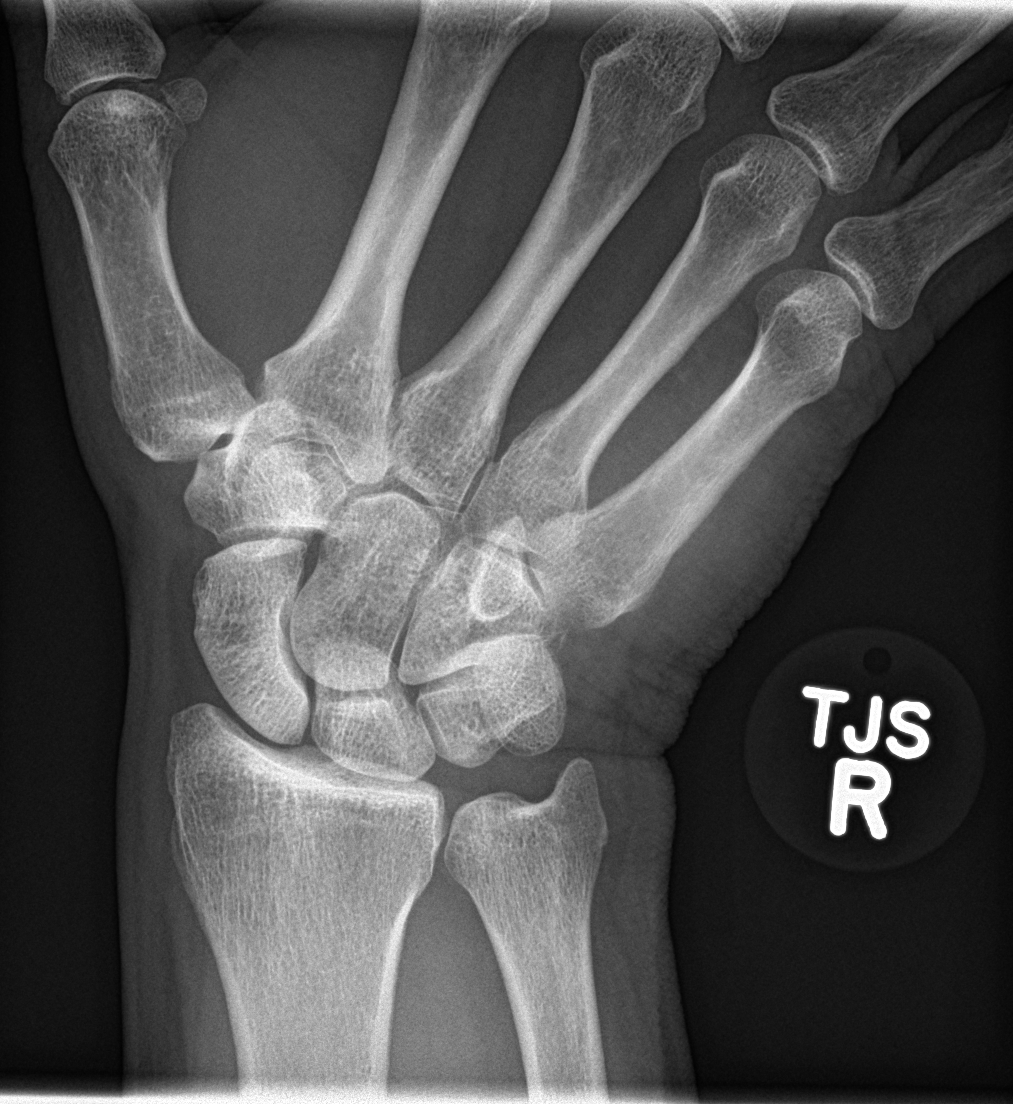

[4 of 4 positions shown; findings below may reference images not displayed]

FINDINGS: No acute or healing fracture.
IMPRESSION: No findings to explain the patient's pain.

## 2018-09-07 ENCOUNTER — Other Ambulatory Visit: Payer: Self-pay | Admitting: Family Medicine

## 2018-09-26 DIAGNOSIS — R9431 Abnormal electrocardiogram [ECG] [EKG]: Secondary | ICD-10-CM | POA: Diagnosis not present

## 2018-09-26 DIAGNOSIS — R0602 Shortness of breath: Secondary | ICD-10-CM | POA: Diagnosis not present

## 2018-09-26 DIAGNOSIS — I10 Essential (primary) hypertension: Secondary | ICD-10-CM | POA: Diagnosis not present

## 2018-09-26 DIAGNOSIS — I2510 Atherosclerotic heart disease of native coronary artery without angina pectoris: Secondary | ICD-10-CM | POA: Diagnosis not present

## 2018-10-03 DIAGNOSIS — E119 Type 2 diabetes mellitus without complications: Secondary | ICD-10-CM | POA: Diagnosis not present

## 2018-10-04 ENCOUNTER — Other Ambulatory Visit: Payer: Self-pay | Admitting: Family Medicine

## 2018-10-19 DIAGNOSIS — R9431 Abnormal electrocardiogram [ECG] [EKG]: Secondary | ICD-10-CM | POA: Diagnosis not present

## 2018-10-19 DIAGNOSIS — E782 Mixed hyperlipidemia: Secondary | ICD-10-CM | POA: Diagnosis not present

## 2018-10-19 DIAGNOSIS — I6523 Occlusion and stenosis of bilateral carotid arteries: Secondary | ICD-10-CM | POA: Diagnosis not present

## 2018-10-19 DIAGNOSIS — I2510 Atherosclerotic heart disease of native coronary artery without angina pectoris: Secondary | ICD-10-CM | POA: Diagnosis not present

## 2018-10-19 DIAGNOSIS — I6522 Occlusion and stenosis of left carotid artery: Secondary | ICD-10-CM | POA: Diagnosis not present

## 2018-10-19 DIAGNOSIS — I10 Essential (primary) hypertension: Secondary | ICD-10-CM | POA: Diagnosis not present

## 2018-10-24 ENCOUNTER — Ambulatory Visit (INDEPENDENT_AMBULATORY_CARE_PROVIDER_SITE_OTHER): Payer: BLUE CROSS/BLUE SHIELD | Admitting: Family Medicine

## 2018-10-24 ENCOUNTER — Encounter: Payer: Self-pay | Admitting: Family Medicine

## 2018-10-24 ENCOUNTER — Other Ambulatory Visit: Payer: Self-pay

## 2018-10-24 DIAGNOSIS — R059 Cough, unspecified: Secondary | ICD-10-CM

## 2018-10-24 DIAGNOSIS — R05 Cough: Secondary | ICD-10-CM

## 2018-10-24 DIAGNOSIS — J40 Bronchitis, not specified as acute or chronic: Secondary | ICD-10-CM | POA: Diagnosis not present

## 2018-10-24 DIAGNOSIS — M722 Plantar fascial fibromatosis: Secondary | ICD-10-CM

## 2018-10-24 MED ORDER — DOXYCYCLINE HYCLATE 100 MG PO TABS: 100.0000 mg | ORAL_TABLET | Freq: Two times a day (BID) | ORAL | 0 refills | Status: AC

## 2018-10-24 NOTE — Progress Notes (Signed)
Patient: Alex [name] Male    DOB: Aug 17, 1965   53 y.o.   MRN: [id number] Visit Date: 10/24/2018  Today's Provider: Lelon Huh, MD   Chief Complaint  Patient presents with  . URI    x 5 days   Subjective:     URI   This is a new problem. Episode onset: 5 days ago. The problem has been unchanged. There has been no fever. Associated symptoms include congestion (chest and nasal congestion), coughing, headaches and sneezing. Pertinent negatives include no abdominal pain, chest pain (chest), ear pain, nausea, rhinorrhea, sinus pain, sore throat, vomiting or wheezing. Treatments tried: OTC Sudafed and Allergy medication. The treatment provided no relief.  Having some dyspnea with exertion. Coughing seems to be precipitated by drainage in back of throat. No sinus pressure. No fever  Denies any recent travel to areas with endemic coronavirus or travels on cruise ship or known contact with anyone diagnosed with coronavirus.    He also reports pain in bottom of right heel worse when first standing after being off feet for awhile, has been getting progressively worse over the last few weeks.   Allergies  Allergen Reactions  . Aleve  [Naproxen Sodium]   . Mobic  [Meloxicam]   . Voltaren  [Diclofenac Sodium]      Current Outpatient Medications:  .  clopidogrel (PLAVIX) 75 MG tablet, TAKE 1 TABLET BY MOUTH EVERY DAY, Disp: 90 tablet, Rfl: 4 .  Coenzyme Q10 (COQ10) 200 MG CAPS, Take 1 capsule by mouth 2 (two) times daily., Disp: , Rfl:  .  Cyanocobalamin (VITAMIN B 12) 250 MCG LOZG, Take by mouth., Disp: , Rfl:  .  fluticasone (FLONASE) 50 MCG/ACT nasal spray, Place into the nose., Disp: , Rfl:  .  glucose blood test strip, , Disp: , Rfl:  .  insulin regular human CONCENTRATED (HUMULIN R) 500 UNIT/ML injection, Use up to 250 units (as calculated by U100 syringe) daily via insulin pump, Disp: , Rfl:  .  lisinopril (PRINIVIL,ZESTRIL) 10 MG tablet, Take 1 tablet (10 mg total) by mouth  daily., Disp: 90 tablet, Rfl: 1 .  LORazepam (ATIVAN) 0.5 MG tablet, Take by mouth. 1-2 every 6 hours as needed, Disp: , Rfl:  .  metFORMIN (GLUCOPHAGE) 500 MG tablet, Take 500 mg by mouth 2 (two) times daily. , Disp: , Rfl:  .  metoCLOPramide (REGLAN) 10 MG tablet, Take 1 tablet (10 mg total) by mouth 4 (four) times daily as needed., Disp: 30 tablet, Rfl: 5 .  Multiple Vitamin (MULTIVITAMIN) tablet, Take 1 tablet by mouth daily., Disp: , Rfl:  .  Omega-3 Fatty Acids (FISH OIL) 1000 MG CAPS, Take 1 capsule by mouth 2 (two) times daily. , Disp: , Rfl:  .  pantoprazole (PROTONIX) 40 MG tablet, TAKE 1 TABLET BY MOUTH EVERY DAY, Disp: 90 tablet, Rfl: 4 .  rosuvastatin (CRESTOR) 40 MG tablet, TAKE 1 TABLET(40 MG) BY MOUTH DAILY, Disp: 90 tablet, Rfl: 4 .  sertraline (ZOLOFT) 100 MG tablet, TAKE 1 TABLET BY MOUTH ONCE DAILY, Disp: 90 tablet, Rfl: 4  Review of Systems  Constitutional: Negative for appetite change, chills and fever.  HENT: Positive for congestion (chest and nasal congestion) and sneezing. Negative for ear pain, rhinorrhea, sinus pain and sore throat.   Respiratory: Positive for cough and shortness of breath (with exertion). Negative for chest tightness and wheezing.   Cardiovascular: Negative for chest pain (chest) and palpitations.  Gastrointestinal: Negative for abdominal pain,  nausea and vomiting.  Neurological: Positive for headaches.    Social History   Tobacco Use  . Smoking status: Former Smoker    Packs/day: 1.50    Years: 20.00    Pack years: 30.00    Types: Cigarettes    Last attempt to quit: 08/10/2006    Years since quitting: 12.2  . Smokeless tobacco: Never Used  Substance Use Topics  . Alcohol use: No    Alcohol/week: 0.0 standard drinks      Objective:   BP 140/76 (BP Location: Left Arm, Cuff Size: Large)   Pulse 91   Temp 98.7 F (37.1 C) (Oral)   Resp 18   Wt 238 lb (108 kg)   SpO2 96% Comment: room air  BMI 33.19 kg/m  Vitals:   10/24/18 1116  10/24/18 1122  BP: (!) 152/84 140/76  Pulse: 91   Resp: 18   Temp: 98.7 F (37.1 C)   TempSrc: Oral   SpO2: 96%   Weight: 238 lb (108 kg)      Physical Exam  General Appearance:    Alert, cooperative, no distress  HENT:   ENT exam normal, no neck nodes or sinus tenderness and post nasal drip noted  Eyes:    PERRL, conjunctiva/corneas clear, EOM's intact       Lungs:     Occasional wheezes, no rales, respirations unlabored  Heart:    Regular rate and rhythm  Neurologic:   Awake, alert, oriented x 3. No apparent focal neurological           defect.   Ext: Tender plantar aspect of right heal       Assessment & Plan    1. Bronchitis  - doxycycline (VIBRA-TABS) 100 MG tablet; Take 1 tablet (100 mg total) by mouth 2 (two) times daily for 10 days.  Dispense: 20 tablet; Refill: 0  2. Plantar fasciitis of right foot Counseled on heel stretching exercises and application of ice. Given printed information sheet .   3. Cough Secondary to post nasal drainage.      Lelon Huh, MD  [facility]

## 2018-10-24 NOTE — Patient Instructions (Signed)
.   Please review the attached list of medications and notify my office if there are any errors.   . Please bring all of your medications to every appointment so we can make sure that our medication list is the same as yours.    You can take OTC Mucinex (guaifenesin) for chest or sinus congestion and to help your cough   You can take OTC Allegra (fexofenadine) for chest or sinus congestion and to help

## 2018-10-24 NOTE — Progress Notes (Deleted)
       Patient: Alex [name] Male    DOB: [birth year]   52 y.o.   MRN: [id number] Visit Date: 10/24/2018  Today's Provider: Lelon Huh, MD   No chief complaint on file.  Subjective:     Sinus Problem  This is a new problem.    Allergies  Allergen Reactions  . Aleve  [Naproxen Sodium]   . Mobic  [Meloxicam]   . Voltaren  [Diclofenac Sodium]      Current Outpatient Medications:  .  aspirin 81 MG tablet, , Disp: , Rfl:  .  clopidogrel (PLAVIX) 75 MG tablet, TAKE 1 TABLET BY MOUTH EVERY DAY, Disp: 90 tablet, Rfl: 4 .  Coenzyme Q10 (COQ10) 200 MG CAPS, Take 1 capsule by mouth 2 (two) times daily., Disp: , Rfl:  .  Cyanocobalamin (VITAMIN B 12) 250 MCG LOZG, Take by mouth., Disp: , Rfl:  .  fluticasone (FLONASE) 50 MCG/ACT nasal spray, Place into the nose., Disp: , Rfl:  .  glucose blood test strip, , Disp: , Rfl:  .  insulin regular human CONCENTRATED (HUMULIN R) 500 UNIT/ML injection, Use up to 250 units (as calculated by U100 syringe) daily via insulin pump, Disp: , Rfl:  .  lisinopril (PRINIVIL,ZESTRIL) 10 MG tablet, Take 1 tablet (10 mg total) by mouth daily., Disp: 90 tablet, Rfl: 1 .  LORazepam (ATIVAN) 0.5 MG tablet, Take by mouth. 1-2 every 6 hours as needed, Disp: , Rfl:  .  metFORMIN (GLUCOPHAGE) 500 MG tablet, Take 500 mg by mouth 2 (two) times daily. , Disp: , Rfl:  .  metoCLOPramide (REGLAN) 10 MG tablet, Take 1 tablet (10 mg total) by mouth 4 (four) times daily as needed., Disp: 30 tablet, Rfl: 5 .  Multiple Vitamin (MULTIVITAMIN) tablet, Take 1 tablet by mouth daily., Disp: , Rfl:  .  Omega-3 Fatty Acids (FISH OIL) 1000 MG CAPS, Take 1 capsule by mouth 2 (two) times daily. , Disp: , Rfl:  .  pantoprazole (PROTONIX) 40 MG tablet, TAKE 1 TABLET BY MOUTH EVERY DAY, Disp: 90 tablet, Rfl: 4 .  rosuvastatin (CRESTOR) 40 MG tablet, TAKE 1 TABLET(40 MG) BY MOUTH DAILY, Disp: 90 tablet, Rfl: 4 .  sertraline (ZOLOFT) 100 MG tablet, TAKE 1 TABLET BY MOUTH ONCE DAILY, Disp: 90  tablet, Rfl: 4  Review of Systems  Social History   Tobacco Use  . Smoking status: Former Smoker    Packs/day: 1.50    Years: 20.00    Pack years: 30.00    Types: Cigarettes    Last attempt to quit: 08/10/2006    Years since quitting: 12.2  . Smokeless tobacco: Never Used  Substance Use Topics  . Alcohol use: No    Alcohol/week: 0.0 standard drinks      Objective:   There were no vitals taken for this visit. There were no vitals filed for this visit.   Physical Exam      Assessment & Plan       Patient seen and examined by Dr. Lelon Huh , note scribed by Jennings Books, NCMA Lelon Huh, MD  [facility]

## 2018-11-04 ENCOUNTER — Telehealth: Payer: Self-pay | Admitting: *Deleted

## 2018-11-04 ENCOUNTER — Other Ambulatory Visit: Payer: Self-pay | Admitting: Family Medicine

## 2018-11-04 DIAGNOSIS — J40 Bronchitis, not specified as acute or chronic: Secondary | ICD-10-CM

## 2018-11-04 MED ORDER — DOXYCYCLINE HYCLATE 100 MG PO TABS: 100.0000 mg | ORAL_TABLET | Freq: Two times a day (BID) | ORAL | 0 refills | Status: DC

## 2018-11-04 NOTE — Telephone Encounter (Signed)
Please review

## 2018-11-04 NOTE — Telephone Encounter (Signed)
Patient states he is not having any fever but continues to have a ticklish cough with PND. No wheezing or sputum production with cough. Has finished the first round of Doxycycline 2-3 days ago. Was feeling better until ticklish cough restarted today. With his history of CAD, old MI and DM, will refill the Doxycycline 100 mg BID #14. Recommend using Allegra, Flonase and add Delsym for symptom control. Gargle with warm saltwater to ease throat irritation from suspected allergies. Agreed to call in report of progress in 3 days if symptoms not better controlled.

## 2018-11-04 NOTE — Telephone Encounter (Signed)
Patient was seen in office 10/24/2018 for bronchitis. Patient states he completed doxycycline but he is still having productive cough. Patient states he coughs all the time. Patient does have some sob and slight wheezing. Patient wants to know if he needs another round of antibiotic or another ov? Please advise?

## 2018-11-10 ENCOUNTER — Telehealth: Payer: Self-pay

## 2018-11-10 NOTE — Telephone Encounter (Signed)
Patient requesting a medication for cough. Patient reports cough is worse in the evenings. Patient uses [facility]

## 2018-11-11 MED ORDER — HYDROCODONE-HOMATROPINE 5-1.5 MG/5ML PO SYRP: 5.0000 mL | ORAL_SOLUTION | Freq: Three times a day (TID) | ORAL | 0 refills | Status: DC | PRN

## 2018-11-11 NOTE — Telephone Encounter (Signed)
Spoke with patient on phone and he states that his cough has not improved since his last visit in office. Reviewing over patients chart he was seen in office on 10/24/18 and diagnosed with bronchitis and prescribed doxycycline. Patient states that cough is now productive of milky like sputum and is worse upon awakening and at bedtime. Patient denies any other URI symptoms, fever,  sinus or G.I symptoms. Patient states that he has been taking otc Delsym, Sudafed and Allegra with no relief. Patient uses [facility] on [address], please advise. KW

## 2018-11-14 ENCOUNTER — Telehealth: Payer: Self-pay | Admitting: Family Medicine

## 2018-11-14 NOTE — Telephone Encounter (Signed)
Attempted to call patient on home and mobile phone regarding his appointment at [facility] on 11/15/2018.Alex [name] Please get more information, we would prefer patients with respiratory sx stay home for their safety and ours.  If having progressive shortness of breath then go to ER If just has persistent cough then we can send in prescription for cough med and a different antibiotic.

## 2018-11-15 ENCOUNTER — Ambulatory Visit: Payer: Self-pay | Admitting: Family Medicine

## 2018-11-15 MED ORDER — MONTELUKAST SODIUM 10 MG PO TABS: 10.0000 mg | ORAL_TABLET | Freq: Every day | ORAL | 3 refills | Status: DC

## 2018-11-15 MED ORDER — ALBUTEROL SULFATE HFA 108 (90 BASE) MCG/ACT IN AERS: 1.0000 | INHALATION_SPRAY | Freq: Four times a day (QID) | RESPIRATORY_TRACT | 2 refills | Status: DC | PRN

## 2018-11-15 MED ORDER — AZITHROMYCIN 250 MG PO TABS: ORAL_TABLET | ORAL | 0 refills | Status: AC

## 2018-11-15 MED ORDER — HYDROCODONE-HOMATROPINE 5-1.5 MG/5ML PO SYRP: 5.0000 mL | ORAL_SOLUTION | Freq: Three times a day (TID) | ORAL | 0 refills | Status: DC | PRN

## 2018-11-15 NOTE — Telephone Encounter (Signed)
Have sent prescription for azithromycin, inhaler, and Singulair to [facility].  Had prescription for hydrocodone cough syrup last week. Has that been helping, if so does he need a refill or does he have some left.

## 2018-11-15 NOTE — Telephone Encounter (Signed)
I spoke with patient and advised him as below. He states his cough has worsened since he was last seen in the office on 10/24/2018 for bronchitis. His cough is worse in the mornings where it is slightly productive with white milky sputum. Patient denies any fever. He does have some shortness of breath during coughing spells. Patient says that deep breathing aggravates cough, which causes him to become short of breath. Patient would like cough medication and antibiotic as offered below. He also request an inhaler. Appointment at [facility] has been cancelled. Please advise on prescriptions. Pharmacy ([facility] [address])

## 2018-11-15 NOTE — Progress Notes (Deleted)
       Patient: Alex [name] Male    DOB: Jan 02, 1966   53 y.o.   MRN: [id number] Visit Date: 11/15/2018  Today's Provider: Lelon Huh, MD   No chief complaint on file.  Subjective:     Cough  This is a recurrent problem.    Allergies  Allergen Reactions  . Aleve  [Naproxen Sodium]   . Mobic  [Meloxicam]   . Voltaren  [Diclofenac Sodium]      Current Outpatient Medications:  .  clopidogrel (PLAVIX) 75 MG tablet, TAKE 1 TABLET BY MOUTH EVERY DAY, Disp: 90 tablet, Rfl: 4 .  Coenzyme Q10 (COQ10) 200 MG CAPS, Take 1 capsule by mouth 2 (two) times daily., Disp: , Rfl:  .  Cyanocobalamin (VITAMIN B 12) 250 MCG LOZG, Take by mouth., Disp: , Rfl:  .  doxycycline (VIBRA-TABS) 100 MG tablet, Take 1 tablet (100 mg total) by mouth 2 (two) times daily., Disp: 14 tablet, Rfl: 0 .  fluticasone (FLONASE) 50 MCG/ACT nasal spray, Place into the nose., Disp: , Rfl:  .  glucose blood test strip, , Disp: , Rfl:  .  HYDROcodone-homatropine (HYCODAN) 5-1.5 MG/5ML syrup, Take 5 mLs by mouth every 8 (eight) hours as needed., Disp: 100 mL, Rfl: 0 .  insulin regular human CONCENTRATED (HUMULIN R) 500 UNIT/ML injection, Use up to 250 units (as calculated by U100 syringe) daily via insulin pump, Disp: , Rfl:  .  lisinopril (PRINIVIL,ZESTRIL) 10 MG tablet, Take 1 tablet (10 mg total) by mouth daily., Disp: 90 tablet, Rfl: 1 .  LORazepam (ATIVAN) 0.5 MG tablet, Take by mouth. 1-2 every 6 hours as needed, Disp: , Rfl:  .  metFORMIN (GLUCOPHAGE) 500 MG tablet, Take 500 mg by mouth 2 (two) times daily. , Disp: , Rfl:  .  metoCLOPramide (REGLAN) 10 MG tablet, Take 1 tablet (10 mg total) by mouth 4 (four) times daily as needed., Disp: 30 tablet, Rfl: 5 .  Multiple Vitamin (MULTIVITAMIN) tablet, Take 1 tablet by mouth daily., Disp: , Rfl:  .  Omega-3 Fatty Acids (FISH OIL) 1000 MG CAPS, Take 1 capsule by mouth 2 (two) times daily. , Disp: , Rfl:  .  pantoprazole (PROTONIX) 40 MG tablet, TAKE 1 TABLET BY MOUTH  EVERY DAY, Disp: 90 tablet, Rfl: 4 .  rosuvastatin (CRESTOR) 40 MG tablet, TAKE 1 TABLET(40 MG) BY MOUTH DAILY, Disp: 90 tablet, Rfl: 4 .  sertraline (ZOLOFT) 100 MG tablet, TAKE 1 TABLET BY MOUTH ONCE DAILY, Disp: 90 tablet, Rfl: 4  Review of Systems  Respiratory: Positive for cough.     Social History   Tobacco Use  . Smoking status: Former Smoker    Packs/day: 1.50    Years: 20.00    Pack years: 30.00    Types: Cigarettes    Last attempt to quit: 08/10/2006    Years since quitting: 12.2  . Smokeless tobacco: Never Used  Substance Use Topics  . Alcohol use: No    Alcohol/week: 0.0 standard drinks      Objective:   There were no vitals taken for this visit. There were no vitals filed for this visit.   Physical Exam      Assessment & Plan        Lelon Huh, MD  [facility]

## 2018-11-15 NOTE — Telephone Encounter (Signed)
Patient advised. He states the cough syrup did help with the cough for a short period of time, but would then begin to wear off. He has a small amount left and would like a refill if possible.

## 2018-11-28 ENCOUNTER — Telehealth: Payer: Self-pay | Admitting: *Deleted

## 2018-11-28 MED ORDER — BENZONATATE 200 MG PO CAPS: 200.0000 mg | ORAL_CAPSULE | Freq: Two times a day (BID) | ORAL | 0 refills | Status: DC | PRN

## 2018-11-28 NOTE — Telephone Encounter (Signed)
Patient's wife Joelene Millin called office stating patient is still coughing. He has completed all medications he was given for Bronchitis symptoms. Patient would like to try Tessalon Pearls. Please advise? [facility] [address].

## 2018-11-28 NOTE — Telephone Encounter (Signed)
Have sent prescription for [facility]alon to [facility]. However cough should be improved by now. If it is not improving then he needs to have chest XR ordered.

## 2018-11-29 NOTE — Telephone Encounter (Signed)
Advised wife as below.  

## 2018-12-02 DIAGNOSIS — E119 Type 2 diabetes mellitus without complications: Secondary | ICD-10-CM | POA: Diagnosis not present

## 2018-12-05 ENCOUNTER — Telehealth: Payer: Self-pay | Admitting: Family Medicine

## 2018-12-05 ENCOUNTER — Other Ambulatory Visit: Payer: Self-pay

## 2018-12-05 ENCOUNTER — Ambulatory Visit
Admission: RE | Admit: 2018-12-05 | Discharge: 2018-12-05 | Disposition: A | Discharge status: Home / Self Care | Payer: BLUE CROSS/BLUE SHIELD | Source: Ambulatory Visit | Attending: Family Medicine | Admitting: Family Medicine

## 2018-12-05 DIAGNOSIS — R05 Cough: Secondary | ICD-10-CM | POA: Insufficient documentation

## 2018-12-05 DIAGNOSIS — R059 Cough, unspecified: Secondary | ICD-10-CM

## 2018-12-05 DIAGNOSIS — J40 Bronchitis, not specified as acute or chronic: Secondary | ICD-10-CM

## 2018-12-05 NOTE — Telephone Encounter (Signed)
Pt is still having bronchitis symptoms that Dr. Caryn Section has treated him for..  No fever.  Continued coughing with nothing coming up.  Please call pt back to let him know what else he can do.  Please advise.  Thanks, [employer]

## 2018-12-05 NOTE — Telephone Encounter (Signed)
Patient is still having a persistent cough. Per note on 11/28/2018, if not improving he will need a CXR. CXR was ordered. Patient advised.

## 2018-12-06 ENCOUNTER — Other Ambulatory Visit: Payer: Self-pay | Admitting: Family Medicine

## 2018-12-06 ENCOUNTER — Telehealth: Payer: Self-pay

## 2018-12-06 DIAGNOSIS — J189 Pneumonia, unspecified organism: Secondary | ICD-10-CM

## 2018-12-06 MED ORDER — PREDNISONE 10 MG PO TABS: ORAL_TABLET | ORAL | 0 refills | Status: AC

## 2018-12-06 NOTE — Progress Notes (Signed)
Prednisone for pulmonary inflammation. See chest XR report.

## 2018-12-06 NOTE — Telephone Encounter (Signed)
Patient advised and agrees with treatment plan and pulmonology referral. Please schedule.

## 2018-12-06 NOTE — Telephone Encounter (Signed)
-----   Message from Birdie Sons, MD sent at 12/06/2018  9:53 AM EDT ----- Alex [name] shows signs of persistent inflammation. This could be due to allergies, asthma, recurrent infection (bronchitis), or other chronic lung disease.  Will put him on 12 day prednisone taper to help with the inflammation, but also recommend referral to pulmonologist for further evaluation.

## 2018-12-07 DIAGNOSIS — E1129 Type 2 diabetes mellitus with other diabetic kidney complication: Secondary | ICD-10-CM | POA: Diagnosis not present

## 2018-12-07 DIAGNOSIS — Z794 Long term (current) use of insulin: Secondary | ICD-10-CM | POA: Diagnosis not present

## 2018-12-07 DIAGNOSIS — R809 Proteinuria, unspecified: Secondary | ICD-10-CM | POA: Diagnosis not present

## 2018-12-09 DIAGNOSIS — Z9641 Presence of insulin pump (external) (internal): Secondary | ICD-10-CM | POA: Diagnosis not present

## 2018-12-09 DIAGNOSIS — I10 Essential (primary) hypertension: Secondary | ICD-10-CM | POA: Diagnosis not present

## 2018-12-09 DIAGNOSIS — E1165 Type 2 diabetes mellitus with hyperglycemia: Secondary | ICD-10-CM | POA: Diagnosis not present

## 2018-12-09 DIAGNOSIS — E1159 Type 2 diabetes mellitus with other circulatory complications: Secondary | ICD-10-CM | POA: Diagnosis not present

## 2018-12-13 ENCOUNTER — Ambulatory Visit (INDEPENDENT_AMBULATORY_CARE_PROVIDER_SITE_OTHER): Payer: BLUE CROSS/BLUE SHIELD | Admitting: Internal Medicine

## 2018-12-13 DIAGNOSIS — J849 Interstitial pulmonary disease, unspecified: Secondary | ICD-10-CM

## 2018-12-13 NOTE — Progress Notes (Addendum)
[facility] Consultation     Virtual Visit via Telephone Note I connected with patient on 12/13/18 at  9:00 AM EDT by telephone and verified that I am speaking with the correct person using two identifiers.   I discussed the limitations, risks, security and privacy concerns of performing an evaluation and management service by telephone and the availability of in person appointments. I also discussed with the patient that there may be a patient responsible charge related to this service. The patient expressed understanding and agreed to proceed. I discussed the assessment and treatment plan with the patient. The patient was provided an opportunity to ask questions and all were answered. The patient agreed with the plan and demonstrated an understanding of the instructions. Please see note below for further detail.    The patient was advised to call back or seek an in-person evaluation if the symptoms worsen or if the condition fails to improve as anticipated.  I provided 35 minutes of non-face-to-face time during this encounter.   Laverle Hobby, MD    Assessment and Plan:  Dyspnea and cough. - Acute onset with respiratory symptoms after URTI about 3 months ago.  These appear to be resolving with recent course of prednisone. - Continue as needed as needed cough syrup, he is taking over-the-counter medication which appears to be helping.  Possible interstitial lung disease. - Review of chest x-ray shows bilateral interstitial prominence, suggestive of underlying interstitial lung disease.  Patient's brother has a history of sarcoidosis, patient's significant improvement with steroids suggest this is a possibility as well. - I have recommended follow-up in about 1 month to see how he is doing, I have asked him to call us back after he is done with prednisone and if he notices symptoms returning.  In that case I will check a CT chest high resolution, as well as  serology before the 1 month follow-up.  Addendum 01/03/19; continues to have cough, will order Ct high-res and ACE blood work.   Diabetes mellitus, coronary artery disease. - Patient is currently on insulin pump, will need to consider this and management if patient requires long-term prednisone.   Date: 12/13/2018  MRN# [id number] Alex [name] 1965/12/04  Referring Physician: Dr. Caryn Section for lung inflammation.   Alex [name] is a 53 y.o. old male seen in consultation     HPI:   He first noticed problems about 3 months ago with cough and chest congestion. He has a history of MI and went to his cardiologist, had a stress test which showed no abnormalities of the heart. He has been tried on different cough and allergy medications, but since then the problems have persisted. He got a chest xray which showed, he was ordered a course of prednisone taper, which has helped "tons" and almost back to normal.  Tessalon or hycodan, did not help, buckleys cough syrup helped. He has been on lisinopril for several years, has not been stopped.  The symptoms started as a cold and have persisted since then. Since then he has been having dyspnea on exertion, he has been tried on ventolin, but stopped because it helped only a little bit. He currently has a bit of phelgm currently.  He has smoked, 1.5 ppd until 2008. 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  No family lung problems, no personal or family history of cancer.  He has a dog and cat, dog  in bedroom.  He has not been tested for allergies.  He takes protonix for heartburn.   Chest x-ray 12/05/2018>> images personally reviewed, bilateral interstitial infiltrates, worst in the left base.  Advance from previous films from 2009 and 2008.  Echocardiogram: 10/19/18  Echo complete  Result Value Ref Range  LV Ejection Fraction (%) 55    PMHX:   Past Medical History:  Diagnosis Date  . Allergy   .  GERD (gastroesophageal reflux disease)   . History of chicken pox    Surgical Hx:  Past Surgical History:  Procedure Laterality Date  . heart artery stent  2008  . KNEE SURGERY    . Myocrasial Perfusion Scan  09/13/2011   Abnormal images. Not thought to be significant per Dr. Nehemiah Massed   Family Hx:  Family History  Problem Relation Age of Onset  . Hypertension Mother   . Diabetes Mother        insulin dependent  . Hyperlipidemia Mother   . Diabetes Other   . Hypertension Other   . Heart disease Other   . Arthritis Other    Social Hx:   Social History   Tobacco Use  . Smoking status: Former Smoker    Packs/day: 1.50    Years: 20.00    Pack years: 30.00    Types: Cigarettes    Last attempt to quit: 08/10/2006    Years since quitting: 12.3  . Smokeless tobacco: Never Used  Substance Use Topics  . Alcohol use: No    Alcohol/week: 0.0 standard drinks  . Drug use: No   Medication:    Current Outpatient Medications:  .  albuterol (PROVENTIL HFA;VENTOLIN HFA) 108 (90 Base) MCG/ACT inhaler, Inhale 1-2 puffs into the lungs every 6 (six) hours as needed for shortness of breath., Disp: 1 Inhaler, Rfl: 2 .  benzonatate (TESSALON) 200 MG capsule, Take 1 capsule (200 mg total) by mouth 2 (two) times daily as needed for cough., Disp: 20 capsule, Rfl: 0 .  clopidogrel (PLAVIX) 75 MG tablet, TAKE 1 TABLET BY MOUTH EVERY DAY, Disp: 90 tablet, Rfl: 4 .  Coenzyme Q10 (COQ10) 200 MG CAPS, Take 1 capsule by mouth 2 (two) times daily., Disp: , Rfl:  .  Cyanocobalamin (VITAMIN B 12) 250 MCG LOZG, Take by mouth., Disp: , Rfl:  .  fluticasone (FLONASE) 50 MCG/ACT nasal spray, Place into the nose., Disp: , Rfl:  .  glucose blood test strip, , Disp: , Rfl:  .  HYDROcodone-homatropine (HYCODAN) 5-1.5 MG/5ML syrup, Take 5 mLs by mouth every 8 (eight) hours as needed., Disp: 100 mL, Rfl: 0 .  insulin regular human CONCENTRATED (HUMULIN R) 500 UNIT/ML injection, Use up to 250 units (as calculated by  U100 syringe) daily via insulin pump, Disp: , Rfl:  .  lisinopril (PRINIVIL,ZESTRIL) 10 MG tablet, Take 1 tablet (10 mg total) by mouth daily., Disp: 90 tablet, Rfl: 1 .  LORazepam (ATIVAN) 0.5 MG tablet, Take by mouth. 1-2 every 6 hours as needed, Disp: , Rfl:  .  metFORMIN (GLUCOPHAGE) 500 MG tablet, Take 500 mg by mouth 2 (two) times daily. , Disp: , Rfl:  .  metoCLOPramide (REGLAN) 10 MG tablet, Take 1 tablet (10 mg total) by mouth 4 (four) times daily as needed., Disp: 30 tablet, Rfl: 5 .  montelukast (SINGULAIR) 10 MG tablet, Take 1 tablet (10 mg total) by mouth at bedtime., Disp: 30 tablet, Rfl: 3 .  Multiple Vitamin (MULTIVITAMIN) tablet, Take 1 tablet by mouth daily., Disp: ,  Rfl:  .  Omega-3 Fatty Acids (FISH OIL) 1000 MG CAPS, Take 1 capsule by mouth 2 (two) times daily. , Disp: , Rfl:  .  pantoprazole (PROTONIX) 40 MG tablet, TAKE 1 TABLET BY MOUTH EVERY DAY, Disp: 90 tablet, Rfl: 4 .  predniSONE (DELTASONE) 10 MG tablet, 6 tablets for 2 days, then 5 for 2 days, then 4 for 2 days, then 3 for 2 days, then 2 for 2 days, then 1 for 2 days., Disp: 42 tablet, Rfl: 0 .  rosuvastatin (CRESTOR) 40 MG tablet, TAKE 1 TABLET(40 MG) BY MOUTH DAILY, Disp: 90 tablet, Rfl: 4 .  sertraline (ZOLOFT) 100 MG tablet, TAKE 1 TABLET BY MOUTH ONCE DAILY, Disp: 90 tablet, Rfl: 4   Allergies:  Aleve  [naproxen sodium]; Mobic  [meloxicam]; and Voltaren  [diclofenac sodium]  Review of Systems: Gen:  Denies  fever, sweats, chills HEENT: Denies blurred vision, double vision. bleeds, sore throat Cvc:  No dizziness, chest pain. Resp:   Denies cough or sputum production, shortness of breath Gi: Denies swallowing difficulty, stomach pain. Gu:  Denies bladder incontinence, burning urine Ext:   No Joint pain, stiffness. Skin: No skin rash,  hives  Endoc:  No polyuria, polydipsia. Psych: No depression, insomnia. Other:  All other systems were reviewed with the patient and were negative other that what is  mentioned in the HPI.   Physical Examination:   --    LABORATORY PANEL:   CBC No results for input(s): WBC, HGB, HCT, PLT in the last 168 hours. ------------------------------------------------------------------------------------------------------------------  Chemistries  No results for input(s): NA, K, CL, CO2, GLUCOSE, BUN, CREATININE, CALCIUM, MG, AST, ALT, ALKPHOS, BILITOT in the last 168 hours.  Invalid input(s): GFRCGP ------------------------------------------------------------------------------------------------------------------  Cardiac Enzymes No results for input(s): TROPONINI in the last 168 hours. ------------------------------------------------------------  RADIOLOGY:  No results found.     Thank  you for the consultation and for allowing [facility] to assist in the care of your patient. Our recommendations are noted above.  Please contact us if we can be of further service.   Marda Stalker, M.D., F.C.C.P.  Board Certified in Internal Medicine, Pulmonary Medicine, [place], and Sleep Medicine.  [facility] Office Number: [phone]   12/13/2018

## 2018-12-13 NOTE — Patient Instructions (Signed)
Continue prednisone as prescribed. We will plan to follow-up with you in about 1 month.  However if your symptoms recur before that I would like you to call us and inform us of this, in that case we will order a scan of your lungs and blood work before 1 month.

## 2018-12-19 DIAGNOSIS — E119 Type 2 diabetes mellitus without complications: Secondary | ICD-10-CM | POA: Diagnosis not present

## 2019-01-03 ENCOUNTER — Telehealth: Payer: Self-pay | Admitting: Internal Medicine

## 2019-01-03 DIAGNOSIS — J849 Interstitial pulmonary disease, unspecified: Secondary | ICD-10-CM

## 2019-01-03 NOTE — Addendum Note (Signed)
Addended by: Laverle Hobby on: 01/03/2019 02:38 PM   Modules accepted: Orders

## 2019-01-03 NOTE — Telephone Encounter (Signed)
Yes, ordered blood work and CT chest.

## 2019-01-03 NOTE — Telephone Encounter (Signed)
Dr. Juanell Fairly please advise if you want him to get bloodwork and CT scan before apt 01/10/19.

## 2019-01-03 NOTE — Telephone Encounter (Signed)
Patient is aware, orders entered.

## 2019-01-04 NOTE — Telephone Encounter (Signed)
CT Chest High Res scheduled for Friday 01/06/2019 at 4:30 at [facility]. Pt to arrive at 4:15. Called and spoke with patient and he is aware of appointment date, time, location and instructions.  Nothing else needed at this time. Rhonda J Cobb

## 2019-01-06 ENCOUNTER — Ambulatory Visit
Admission: RE | Admit: 2019-01-06 | Discharge: 2019-01-06 | Disposition: A | Discharge status: Home / Self Care | Payer: BLUE CROSS/BLUE SHIELD | Source: Ambulatory Visit | Attending: Internal Medicine | Admitting: Internal Medicine

## 2019-01-06 ENCOUNTER — Other Ambulatory Visit
Admission: RE | Admit: 2019-01-06 | Discharge: 2019-01-06 | Disposition: A | Discharge status: Home / Self Care | Payer: BLUE CROSS/BLUE SHIELD | Source: Ambulatory Visit | Attending: Internal Medicine | Admitting: Internal Medicine

## 2019-01-06 ENCOUNTER — Other Ambulatory Visit: Payer: Self-pay

## 2019-01-06 DIAGNOSIS — J432 Centrilobular emphysema: Secondary | ICD-10-CM | POA: Diagnosis not present

## 2019-01-06 DIAGNOSIS — J849 Interstitial pulmonary disease, unspecified: Secondary | ICD-10-CM | POA: Diagnosis not present

## 2019-01-06 LAB — CBC WITH DIFFERENTIAL/PLATELET
Abs Immature Granulocytes: 0.01 10*3/uL (ref 0.00–0.07)
Basophils Absolute: 0 10*3/uL (ref 0.0–0.1)
Basophils Relative: 1 %
Eosinophils Absolute: 0.2 10*3/uL (ref 0.0–0.5)
Eosinophils Relative: 3 %
HCT: 41.6 % (ref 39.0–52.0)
Hemoglobin: 14.2 g/dL (ref 13.0–17.0)
Immature Granulocytes: 0 %
Lymphocytes Relative: 22 %
Lymphs Abs: 1.6 10*3/uL (ref 0.7–4.0)
MCH: 29.3 pg (ref 26.0–34.0)
MCHC: 34.1 g/dL (ref 30.0–36.0)
MCV: 86 fL (ref 80.0–100.0)
Monocytes Absolute: 0.5 10*3/uL (ref 0.1–1.0)
Monocytes Relative: 8 %
Neutro Abs: 4.8 10*3/uL (ref 1.7–7.7)
Neutrophils Relative %: 66 %
Platelets: 219 10*3/uL (ref 150–400)
RBC: 4.84 MIL/uL (ref 4.22–5.81)
RDW: 13.7 % (ref 11.5–15.5)
WBC: 7.1 10*3/uL (ref 4.0–10.5)
nRBC: 0 % (ref 0.0–0.2)

## 2019-01-07 LAB — ANGIOTENSIN CONVERTING ENZYME: Angiotensin-Converting Enzyme: 8 U/L — ABNORMAL LOW (ref 14–82)

## 2019-01-09 NOTE — Progress Notes (Signed)
[facility] [facility]    Virtual Visit via Telephone Note I connected with patient on 01/10/19 at  9:00 AM EDT by telephone and verified that I am speaking with the correct person using two identifiers.   I discussed the limitations, risks, security and privacy concerns of performing an evaluation and management service by telephone and the availability of in person appointments. I also discussed with the patient that there may be a patient responsible charge related to this service. The patient expressed understanding and agreed to proceed. I discussed the assessment and treatment plan with the patient. The patient was provided an opportunity to ask questions and all were answered. The patient agreed with the plan and demonstrated an understanding of the instructions. Please see note below for further detail.    The patient was advised to call back or seek an in-person evaluation if the symptoms worsen or if the condition fails to improve as anticipated.    Laverle Hobby, MD    Assessment and Plan:  Dyspnea and cough. - Continue dyspnea and cough with production of sputum, predominantly in the morning. - Appears to have been steroid responsive.  Possible interstitial lung disease. - Review of chest x-ray shows bilateral interstitial prominence, suggestive of underlying interstitial lung disease.  CT chest shows some mild early traction bronchiectasis. ACE level negative. - We will check serology, asked the patient to start on a steroid inhaler [name]).  We will also start antihistamine in addition to the patient's current dose of montelukast. - Follow-up in 1 month, if not improved will consider bronchoscopy.  Diabetes mellitus, coronary artery disease. - Patient is currently on insulin pump, will need to consider this and management if patient requires long-term prednisone.   Date: 01/09/2019  MRN# [id number] Alex [name] 10/02/1965  Referring Physician: Dr.  Caryn [name] for lung inflammation.   Alex [name] is a 53 y.o. old male seen in consultation     HPI:  Patient is a 53 year old male with symptoms of cough and congestion for about 4 months, which have responded well to prednisone but not the Ventolin.  He is a former smoker.  CT high resolution showed some early micro-nodularity, possible cirrhosis, early changes of traction bronchiectasis, air trapping.  Since his last visit he feels that he continues to have cough, he has a lot of mucus in the AM. Some days he coughs more than others.  He is on no inhalers, takes zyrtec.  He has a cat and dog, dog in bedroom. He has never been tested for allergies.  He has not smoked since 2008.   He first noticed problems about 3 months ago with cough and chest congestion. He has a history of MI and went to his cardiologist, had a stress test which showed no abnormalities of the heart. He has been tried on different cough and allergy medications, but since then the problems have persisted. He got a chest xray which showed, he was ordered a course of prednisone taper, which has helped "tons" and almost back to normal.  Tessalon or hycodan, did not help, buckleys cough syrup helped. He has been on lisinopril for several years, has not been stopped.  The symptoms started as a cold and have persisted since then. Since then he has been having dyspnea on exertion, he has been tried on ventolin, but stopped because it helped only a little bit. He currently has a bit of phelgm currently.  He has smoked, 1.5 ppd until 2008. 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  No family lung problems, no personal or family history of cancer.  He has a dog and cat, dog in bedroom.  He has not been tested for allergies.  He takes protonix for heartburn.   **ACE level 01/06/2019>> negative. **CT chest hi-res 01/06/2019>> images personally viewed, tiny groundglass nodularity seen  in both lungs, there is early evidence of traction bronchiectasis and some air trapping.  Per radiology report, there is suggestions of cirrhosis, hepatic steatosis. Chest x-ray 12/05/2018>> images personally reviewed, bilateral interstitial infiltrates, worst in the left base.  Advance from previous films from 2009 and 2008. Echocardiogram 10/19/18 >> Left ventricular cavity: normal. LVEF= 61%   Medication:    Current Outpatient Medications:  .  albuterol (PROVENTIL HFA;VENTOLIN HFA) 108 (90 Base) MCG/ACT inhaler, Inhale 1-2 puffs into the lungs every 6 (six) hours as needed for shortness of breath., Disp: 1 Inhaler, Rfl: 2 .  benzonatate (TESSALON) 200 MG capsule, Take 1 capsule (200 mg total) by mouth 2 (two) times daily as needed for cough., Disp: 20 capsule, Rfl: 0 .  clopidogrel (PLAVIX) 75 MG tablet, TAKE 1 TABLET BY MOUTH EVERY DAY, Disp: 90 tablet, Rfl: 4 .  Coenzyme Q10 (COQ10) 200 MG CAPS, Take 1 capsule by mouth 2 (two) times daily., Disp: , Rfl:  .  Cyanocobalamin (VITAMIN B 12) 250 MCG LOZG, Take by mouth., Disp: , Rfl:  .  fluticasone (FLONASE) 50 MCG/ACT nasal spray, Place into the nose., Disp: , Rfl:  .  glucose blood test strip, , Disp: , Rfl:  .  HYDROcodone-homatropine (HYCODAN) 5-1.5 MG/5ML syrup, Take 5 mLs by mouth every 8 (eight) hours as needed., Disp: 100 mL, Rfl: 0 .  insulin regular human CONCENTRATED (HUMULIN R) 500 UNIT/ML injection, Use up to 250 units (as calculated by U100 syringe) daily via insulin pump, Disp: , Rfl:  .  lisinopril (PRINIVIL,ZESTRIL) 10 MG tablet, Take 1 tablet (10 mg total) by mouth daily., Disp: 90 tablet, Rfl: 1 .  LORazepam (ATIVAN) 0.5 MG tablet, Take by mouth. 1-2 every 6 hours as needed, Disp: , Rfl:  .  metFORMIN (GLUCOPHAGE) 500 MG tablet, Take 500 mg by mouth 2 (two) times daily. , Disp: , Rfl:  .  metoCLOPramide (REGLAN) 10 MG tablet, Take 1 tablet (10 mg total) by mouth 4 (four) times daily as needed., Disp: 30 tablet, Rfl: 5 .   montelukast (SINGULAIR) 10 MG tablet, Take 1 tablet (10 mg total) by mouth at bedtime., Disp: 30 tablet, Rfl: 3 .  Multiple Vitamin (MULTIVITAMIN) tablet, Take 1 tablet by mouth daily., Disp: , Rfl:  .  Omega-3 Fatty Acids (FISH OIL) 1000 MG CAPS, Take 1 capsule by mouth 2 (two) times daily. , Disp: , Rfl:  .  pantoprazole (PROTONIX) 40 MG tablet, TAKE 1 TABLET BY MOUTH EVERY DAY, Disp: 90 tablet, Rfl: 4 .  rosuvastatin (CRESTOR) 40 MG tablet, TAKE 1 TABLET(40 MG) BY MOUTH DAILY, Disp: 90 tablet, Rfl: 4 .  sertraline (ZOLOFT) 100 MG tablet, TAKE 1 TABLET BY MOUTH ONCE DAILY, Disp: 90 tablet, Rfl: 4   Allergies:  Aleve  [naproxen sodium]; Mobic  [meloxicam]; and Voltaren  [diclofenac sodium]  Review of Systems:  Constitutional: Feels well. Cardiovascular: Denies chest pain, exertional chest pain.  Pulmonary: Denies hemoptysis, pleuritic chest pain.   The remainder of systems were reviewed and were found to be negative other than what is documented in  the HPI.     Physical Examination:   --    LABORATORY PANEL:   CBC Recent Labs  Lab 01/06/19 1112  WBC 7.1  HGB 14.2  HCT 41.6  PLT 219   ------------------------------------------------------------------------------------------------------------------  Chemistries  No results for input(s): NA, K, CL, CO2, GLUCOSE, BUN, CREATININE, CALCIUM, MG, AST, ALT, ALKPHOS, BILITOT in the last 168 hours.  Invalid input(s): GFRCGP ------------------------------------------------------------------------------------------------------------------  Cardiac Enzymes No results for input(s): TROPONINI in the last 168 hours. ------------------------------------------------------------  RADIOLOGY:  No results found.     Thank  you for the consultation and for allowing [facility] to assist in the care of your patient. Our recommendations are noted above.  Please contact us if we can be of further service.    Marda Stalker, M.D., F.C.C.P.  Board Certified in Internal Medicine, Pulmonary Medicine, [place], and Sleep Medicine.  [facility] Office Number: [phone]   01/09/2019

## 2019-01-10 ENCOUNTER — Ambulatory Visit (INDEPENDENT_AMBULATORY_CARE_PROVIDER_SITE_OTHER): Payer: BLUE CROSS/BLUE SHIELD | Admitting: Internal Medicine

## 2019-01-10 ENCOUNTER — Other Ambulatory Visit
Admission: RE | Admit: 2019-01-10 | Discharge: 2019-01-10 | Disposition: A | Discharge status: Home / Self Care | Payer: BLUE CROSS/BLUE SHIELD | Source: Ambulatory Visit | Attending: Internal Medicine | Admitting: Internal Medicine

## 2019-01-10 ENCOUNTER — Other Ambulatory Visit: Payer: Self-pay

## 2019-01-10 DIAGNOSIS — J4540 Moderate persistent asthma, uncomplicated: Secondary | ICD-10-CM

## 2019-01-10 DIAGNOSIS — J849 Interstitial pulmonary disease, unspecified: Secondary | ICD-10-CM | POA: Insufficient documentation

## 2019-01-10 MED ORDER — MOMETASONE FURO-FORMOTEROL FUM 200-5 MCG/ACT IN AERO: 2.0000 | INHALATION_SPRAY | Freq: Two times a day (BID) | RESPIRATORY_TRACT | 3 refills | Status: DC

## 2019-01-10 NOTE — Addendum Note (Signed)
Addended by: Maryanna Shape A on: 01/10/2019 09:47 AM   Modules accepted: Orders

## 2019-01-10 NOTE — Addendum Note (Signed)
Addended by: Laverle Hobby on: 01/10/2019 09:33 AM   Modules accepted: Orders

## 2019-01-10 NOTE — Patient Instructions (Signed)
Obtain blood work today before starting new medications.  Start dulera inhaler 2 puffs twice daily, rinse mouth after use.  Start over the counter antihistamine like claritin or allegra.  Continue montelukast (singulair) that you are on.  Recommend to remove pets from bedroom.

## 2019-01-11 LAB — ANTI-SCLERODERMA ANTIBODY: Scleroderma (Scl-70) (ENA) Antibody, IgG: <0.2 AI (ref 0.0–0.9)

## 2019-01-11 LAB — ANTI-DNA ANTIBODY, DOUBLE-STRANDED: ds DNA Ab: <1 IU/mL (ref 0–9)

## 2019-01-11 LAB — ANTIEXTRACTABLE NUCLEAR AG
ENA SM Ab Ser-aCnc: <0.2 AI (ref 0.0–0.9)
Ribonucleic Protein: <0.2 AI (ref 0.0–0.9)

## 2019-01-11 LAB — SJOGRENS SYNDROME-A EXTRACTABLE NUCLEAR ANTIBODY: SSA (Ro) (ENA) Antibody, IgG: <0.2 AI (ref 0.0–0.9)

## 2019-01-11 LAB — ANA W/REFLEX: Anti Nuclear Antibody (ANA): NEGATIVE

## 2019-01-11 LAB — SJOGRENS SYNDROME-B EXTRACTABLE NUCLEAR ANTIBODY: SSB (La) (ENA) Antibody, IgG: <0.2 AI (ref 0.0–0.9)

## 2019-02-01 ENCOUNTER — Telehealth: Payer: Self-pay | Admitting: Internal Medicine

## 2019-02-01 DIAGNOSIS — J4540 Moderate persistent asthma, uncomplicated: Secondary | ICD-10-CM

## 2019-02-01 NOTE — Telephone Encounter (Signed)
Pt has stopped zyrtec x3 weeks and would like to proceed with allergy testing.  DR, please advise on which labs you would like to order? Thanks

## 2019-02-03 ENCOUNTER — Other Ambulatory Visit
Admission: RE | Admit: 2019-02-03 | Discharge: 2019-02-03 | Disposition: A | Discharge status: Home / Self Care | Payer: BLUE CROSS/BLUE SHIELD | Source: Ambulatory Visit | Attending: Internal Medicine | Admitting: Internal Medicine

## 2019-02-03 DIAGNOSIS — J4540 Moderate persistent asthma, uncomplicated: Secondary | ICD-10-CM | POA: Diagnosis not present

## 2019-02-03 NOTE — Telephone Encounter (Signed)
Rast test

## 2019-02-03 NOTE — Telephone Encounter (Signed)
Lab has been ordered. Left message to make pt aware.

## 2019-02-03 NOTE — Telephone Encounter (Signed)
Pt is aware that RAST test had been ordered.  Nothing further is needed.

## 2019-02-08 ENCOUNTER — Telehealth: Payer: Self-pay | Admitting: Internal Medicine

## 2019-02-08 NOTE — Progress Notes (Signed)
[name]     Assessment and Plan:  Dyspnea and cough. - Continue dyspnea and cough with production of sputum, predominantly in the morning. - Appears to have been steroid responsive. - Continue Dulera inhaler, Singulair. - We will stop lisinopril, start on losartan, asked to do this for 1 month to see if he notices a difference if not he can go back to using lisinopril.  Interstitial lung disease. - ACE level, serology negative. - Appears to be early/mild disease.  Given evidence of cirrhosis seen on CT chest will refer to gastroenterology and check an alpha-1.  Diabetes mellitus, coronary artery disease. - Patient is currently on insulin pump, will need to consider this and management if patient requires long-term prednisone.   Orders Placed This Encounter  Procedures  . Alpha-1-antitrypsin  . Alpha-1 antitrypsin phenotype  . Pulmonary Function Test [facility] Only   Meds ordered this encounter  Medications  . losartan (COZAAR) 25 MG tablet    Sig: Take 1 tablet (25 mg total) by mouth daily. Take in place of lisinopril    Dispense:  30 tablet    Refill:  11   Return in about 8 weeks (around 04/06/2019).    Date: 02/08/2019  MRN# [id number] Alex [name] 07-Feb-1966  Referring Physician: Dr. Caryn Section for lung inflammation.   Alex [name] is a 53 y.o. old male seen in consultation     HPI:  Alex [name] is a 53 y.o. male  with symptoms of cough and congestion for about 4 months, which have responded well to prednisone but not Ventolin.  He is a former smoker.  CT high resolution showed some early micro-nodularity, possible cirrhosis, early changes of traction bronchiectasis, air trapping.  He underwent serological testing which was negative, he was started empirically on Dulera and an antihistamine in addition to his Singulair.  He is taking dulera inhaler 2 puffs twice daily, he is off the singulair since getting the rast test.  He continues to have excess  mucus production, and constant sinus drainage.  He was taking flonase a while back.   He has not smoked since 2008.   He first noticed problems about 3 months ago with cough and chest congestion. He has a history of MI and went to his cardiologist, had a stress test which showed no abnormalities of the heart. He has been tried on different cough and allergy medications, but since then the problems have persisted. He got a chest xray which showed, he was ordered a course of prednisone taper, which has helped "tons" and almost back to normal.  Tessalon or hycodan, did not help, buckleys cough syrup helped. He has been on lisinopril for several years, has not been stopped.  The symptoms started as a cold and have persisted since then. Since then he has been having dyspnea on exertion, he has been tried on ventolin, but stopped because it helped only a little bit. He currently has a bit of phelgm currently.  He has smoked, 1.5 ppd until 2008. 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  No family lung problems, no personal or family history of cancer.  He has a dog and cat, dog in bedroom.  He has not been tested for allergies.  He takes protonix for heartburn.   **Serology 01/10/19>> negative. **ACE level 01/06/2019>> negative. **CT chest hi-res 01/06/2019>> images personally viewed, tiny groundglass nodularity seen in both lungs,  there is early evidence of traction bronchiectasis and some air trapping.  Per radiology report, there is suggestions of cirrhosis, hepatic steatosis. Chest x-ray 12/05/2018>> images personally reviewed, bilateral interstitial infiltrates, worst in the left base.  Advance from previous films from 2009 and 2008. Echocardiogram 10/19/18 >> Left ventricular cavity: normal. LVEF= 61%   Medication:    Current Outpatient Medications:  .  albuterol (PROVENTIL HFA;VENTOLIN HFA) 108 (90 Base) MCG/ACT inhaler, Inhale 1-2 puffs  into the lungs every 6 (six) hours as needed for shortness of breath., Disp: 1 Inhaler, Rfl: 2 .  benzonatate (TESSALON) 200 MG capsule, Take 1 capsule (200 mg total) by mouth 2 (two) times daily as needed for cough., Disp: 20 capsule, Rfl: 0 .  clopidogrel (PLAVIX) 75 MG tablet, TAKE 1 TABLET BY MOUTH EVERY DAY, Disp: 90 tablet, Rfl: 4 .  Coenzyme Q10 (COQ10) 200 MG CAPS, Take 1 capsule by mouth 2 (two) times daily., Disp: , Rfl:  .  Cyanocobalamin (VITAMIN B 12) 250 MCG LOZG, Take by mouth., Disp: , Rfl:  .  fluticasone (FLONASE) 50 MCG/ACT nasal spray, Place into the nose., Disp: , Rfl:  .  glucose blood test strip, , Disp: , Rfl:  .  HYDROcodone-homatropine (HYCODAN) 5-1.5 MG/5ML syrup, Take 5 mLs by mouth every 8 (eight) hours as needed., Disp: 100 mL, Rfl: 0 .  insulin regular human CONCENTRATED (HUMULIN R) 500 UNIT/ML injection, Use up to 250 units (as calculated by U100 syringe) daily via insulin pump, Disp: , Rfl:  .  lisinopril (PRINIVIL,ZESTRIL) 10 MG tablet, Take 1 tablet (10 mg total) by mouth daily., Disp: 90 tablet, Rfl: 1 .  LORazepam (ATIVAN) 0.5 MG tablet, Take by mouth. 1-2 every 6 hours as needed, Disp: , Rfl:  .  metFORMIN (GLUCOPHAGE) 500 MG tablet, Take 500 mg by mouth 2 (two) times daily. , Disp: , Rfl:  .  metoCLOPramide (REGLAN) 10 MG tablet, Take 1 tablet (10 mg total) by mouth 4 (four) times daily as needed., Disp: 30 tablet, Rfl: 5 .  mometasone-formoterol (DULERA) 200-5 MCG/ACT AERO, Inhale 2 puffs into the lungs 2 (two) times daily. Rinse mouth after use., Disp: 1 Inhaler, Rfl: 3 .  montelukast (SINGULAIR) 10 MG tablet, Take 1 tablet (10 mg total) by mouth at bedtime., Disp: 30 tablet, Rfl: 3 .  Multiple Vitamin (MULTIVITAMIN) tablet, Take 1 tablet by mouth daily., Disp: , Rfl:  .  Omega-3 Fatty Acids (FISH OIL) 1000 MG CAPS, Take 1 capsule by mouth 2 (two) times daily. , Disp: , Rfl:  .  pantoprazole (PROTONIX) 40 MG tablet, TAKE 1 TABLET BY MOUTH EVERY DAY, Disp: 90  tablet, Rfl: 4 .  rosuvastatin (CRESTOR) 40 MG tablet, TAKE 1 TABLET(40 MG) BY MOUTH DAILY, Disp: 90 tablet, Rfl: 4 .  sertraline (ZOLOFT) 100 MG tablet, TAKE 1 TABLET BY MOUTH ONCE DAILY, Disp: 90 tablet, Rfl: 4   Allergies:  Aleve  [naproxen sodium], Mobic  [meloxicam], and Voltaren  [diclofenac sodium]  Review of Systems:  Constitutional: Feels well. Cardiovascular: Denies chest pain, exertional chest pain.  Pulmonary: Denies hemoptysis, pleuritic chest pain.   The remainder of systems were reviewed and were found to be negative other than what is documented in the HPI.    Physical Examination:   VS: BP (!) 150/84 (BP Location: Left Arm, Cuff Size: Normal)   Pulse 93   Temp 97.9 F (36.6 C) (Temporal)   SpO2 97%   General Appearance: No distress  Neuro:without focal findings, mental status,  speech normal, alert and oriented HEENT: PERRLA, EOM intact Pulmonary: No wheezing, No rales  CardiovascularNormal S1,S2.  No m/r/g.  Abdomen: Benign, Soft, non-tender, No masses Renal:  No costovertebral tenderness  GU:  No performed at this time. Endoc: No evident thyromegaly, no signs of acromegaly or Cushing features Skin:   warm, no rashes, no ecchymosis  Extremities: normal, no cyanosis, clubbing.    LABORATORY PANEL:   CBC No results for input(s): WBC, HGB, HCT, PLT in the last 168 hours. ------------------------------------------------------------------------------------------------------------------  Chemistries  No results for input(s): NA, K, CL, CO2, GLUCOSE, BUN, CREATININE, CALCIUM, MG, AST, ALT, ALKPHOS, BILITOT in the last 168 hours.  Invalid input(s): GFRCGP ------------------------------------------------------------------------------------------------------------------  Cardiac Enzymes No results for input(s): TROPONINI in the last 168 hours. ------------------------------------------------------------  RADIOLOGY:  No results found.     Thank  you for  the consultation and for allowing [facility] to assist in the care of your patient. Our recommendations are noted above.  Please contact us if we can be of further service.   Marda Stalker, M.D., F.C.C.P.  Board Certified in Internal Medicine, Pulmonary Medicine, [place], and Sleep Medicine.  [facility] Office Number: [phone]   02/08/2019

## 2019-02-08 NOTE — Telephone Encounter (Signed)
Called patient for COVID-19 pre-screening for in office visit. ° °Have you recently traveled any where out of the local area in the last 2 weeks? No ° °Have you been in close contact with a person diagnosed with COVID-19 or someone awaiting results within the last 2 weeks? No ° °Do you currently have any of the following symptoms? If so, when did they start? °Cough     Diarrhea   Joint Pain °Fever      Muscle Pain   Red eyes °Shortness of breath   Abdominal pain  Vomiting °Loss of smell    Rash    Sore Throat °Headache    Weakness   Bruising or bleeding ° ° °Okay to proceed with visit 02/09/2019  ° ° °

## 2019-02-09 ENCOUNTER — Ambulatory Visit (INDEPENDENT_AMBULATORY_CARE_PROVIDER_SITE_OTHER): Payer: BLUE CROSS/BLUE SHIELD | Admitting: Internal Medicine

## 2019-02-09 ENCOUNTER — Other Ambulatory Visit
Admission: RE | Admit: 2019-02-09 | Discharge: 2019-02-09 | Disposition: A | Discharge status: Home / Self Care | Payer: BLUE CROSS/BLUE SHIELD | Source: Ambulatory Visit | Attending: Internal Medicine | Admitting: Internal Medicine

## 2019-02-09 ENCOUNTER — Other Ambulatory Visit: Payer: Self-pay

## 2019-02-09 ENCOUNTER — Encounter: Payer: Self-pay | Admitting: Internal Medicine

## 2019-02-09 VITALS — BP 150/84 | HR 93 | Temp 97.9°F

## 2019-02-09 DIAGNOSIS — K7469 Other cirrhosis of liver: Secondary | ICD-10-CM

## 2019-02-09 DIAGNOSIS — J449 Chronic obstructive pulmonary disease, unspecified: Secondary | ICD-10-CM

## 2019-02-09 LAB — ALLERGENS W/TOTAL IGE AREA 2

## 2019-02-09 MED ORDER — LOSARTAN POTASSIUM 25 MG PO TABS: 25.0000 mg | ORAL_TABLET | Freq: Every day | ORAL | 11 refills | Status: DC

## 2019-02-09 NOTE — Addendum Note (Signed)
Addended by: Santiago Bur on: 02/09/2019 10:47 AM   Modules accepted: Orders

## 2019-02-09 NOTE — Addendum Note (Signed)
Addended by: Maryanna Shape A on: 02/09/2019 11:32 AM   Modules accepted: Orders

## 2019-02-09 NOTE — Addendum Note (Signed)
Addended by: Maryanna Shape A on: 02/09/2019 11:49 AM   Modules accepted: Orders

## 2019-02-09 NOTE — Patient Instructions (Addendum)
Stop lisinopril and take losartan for one month. You should notice an improvement in one month.   Start on Nasacort (over the counter) 2 sprays in each nostril once daily.   Will send for alpha-1 blood test.   Will refer to gastroenterology.   Continue Dulera inhaler, restart Singulair.

## 2019-02-09 NOTE — Addendum Note (Signed)
Addended by: Santiago Bur on: 02/09/2019 10:45 AM   Modules accepted: Orders

## 2019-02-15 LAB — ALPHA-1 ANTITRYPSIN PHENOTYPE: A-1 Antitrypsin, Ser: 81 mg/dL — ABNORMAL LOW (ref 101–187)

## 2019-03-08 ENCOUNTER — Other Ambulatory Visit: Payer: Self-pay

## 2019-03-08 DIAGNOSIS — E1165 Type 2 diabetes mellitus with hyperglycemia: Secondary | ICD-10-CM | POA: Diagnosis not present

## 2019-03-10 ENCOUNTER — Other Ambulatory Visit: Payer: Self-pay

## 2019-03-15 DIAGNOSIS — I10 Essential (primary) hypertension: Secondary | ICD-10-CM | POA: Diagnosis not present

## 2019-03-15 DIAGNOSIS — E1165 Type 2 diabetes mellitus with hyperglycemia: Secondary | ICD-10-CM | POA: Diagnosis not present

## 2019-03-15 DIAGNOSIS — E1159 Type 2 diabetes mellitus with other circulatory complications: Secondary | ICD-10-CM | POA: Diagnosis not present

## 2019-03-15 DIAGNOSIS — Z9641 Presence of insulin pump (external) (internal): Secondary | ICD-10-CM | POA: Diagnosis not present

## 2019-03-20 ENCOUNTER — Ambulatory Visit (INDEPENDENT_AMBULATORY_CARE_PROVIDER_SITE_OTHER): Payer: BC Managed Care – PPO | Admitting: Gastroenterology

## 2019-03-20 ENCOUNTER — Other Ambulatory Visit: Payer: Self-pay

## 2019-03-20 VITALS — BP 134/74 | HR 89 | Temp 98.1°F | Ht 71.0 in | Wt 238.4 lb

## 2019-03-20 DIAGNOSIS — K7460 Unspecified cirrhosis of liver: Secondary | ICD-10-CM

## 2019-03-20 NOTE — Progress Notes (Signed)
Jonathon Bellows MD, MRCP(U.K) [address]  [place]  [place],  [address]  Main: [phone]  Fax: [phone]   Gastroenterology Consultation  Referring Provider:     Laverle Hobby, * Primary Care Physician:  Birdie Sons, MD Primary Gastroenterologist:  Dr. Jonathon Bellows  Reason for Consultation:     Liver cirrhosis        HPI:   Alex [name] is a 53 y.o. y/o male referred for consultation & management  by Dr. Ashby Dawes.  He is followed at the pulmonary clinic for interstitial lung disease.  Incidentally on a CT scan of the chest was noted to have possible liver cirrhosis.  ANA negative serum ACE level is low at 8.  Platelet count in May 2020 is normal.  Creatinine 1.1.  No recent PT or INR.  No recent hepatitis serology.  He denies any prior liver disease.  His father died from liver cirrhosis secondary to fatty liver disease.  He states that the present weight is the heaviest he has weighed at 238 pounds.  He denies any excess alcohol consumption, incarceration, tattoos, blood transfusions.  Past Medical History:  Diagnosis Date  . Allergy   . GERD (gastroesophageal reflux disease)   . History of chicken pox     Past Surgical History:  Procedure Laterality Date  . heart artery stent  2008  . KNEE SURGERY    . Myocrasial Perfusion Scan  09/13/2011   Abnormal images. Not thought to be significant per Dr. Nehemiah Massed    Prior to Admission medications   Medication Sig Start Date End Date Taking? Authorizing Provider  albuterol (PROVENTIL HFA;VENTOLIN HFA) 108 (90 Base) MCG/ACT inhaler Inhale 1-2 puffs into the lungs every 6 (six) hours as needed for shortness of breath. 11/15/18   Birdie Sons, MD  benzonatate (TESSALON) 200 MG capsule Take 1 capsule (200 mg total) by mouth 2 (two) times daily as needed for cough. 11/28/18   Birdie Sons, MD  clopidogrel (PLAVIX) 75 MG tablet TAKE 1 TABLET BY MOUTH EVERY DAY 10/04/18   Birdie Sons, MD  Coenzyme  Q10 (COQ10) 200 MG CAPS Take 1 capsule by mouth 2 (two) times daily.    [provider]  Cyanocobalamin (VITAMIN B 12) 250 MCG LOZG Take by mouth.    [provider]  fluticasone (FLONASE) 50 MCG/ACT nasal spray Place into the nose. 08/31/13   [provider]  glucose blood test strip  02/07/13   [provider]  HYDROcodone-homatropine (HYCODAN) 5-1.5 MG/5ML syrup Take 5 mLs by mouth every 8 (eight) hours as needed. 11/15/18   Birdie Sons, MD  insulin regular human CONCENTRATED (HUMULIN R) 500 UNIT/ML injection Use up to 250 units (as calculated by U100 syringe) daily via insulin pump 02/16/17   [provider]  lisinopril (PRINIVIL,ZESTRIL) 10 MG tablet Take 1 tablet (10 mg total) by mouth daily. 01/29/15   Birdie Sons, MD  LORazepam (ATIVAN) 0.5 MG tablet Take by mouth. 1-2 every 6 hours as needed 07/13/13   [provider]  losartan (COZAAR) 25 MG tablet Take 1 tablet (25 mg total) by mouth daily. Take in place of lisinopril 02/09/19 02/09/20  Laverle Hobby, MD  metFORMIN (GLUCOPHAGE) 500 MG tablet Take 500 mg by mouth 2 (two) times daily.     [provider]  metoCLOPramide (REGLAN) 10 MG tablet Take 1 tablet (10 mg total) by mouth 4 (four) times daily as needed. 07/06/17   Lelon Huh  E, MD  mometasone-formoterol (DULERA) 200-5 MCG/ACT AERO Inhale 2 puffs into the lungs 2 (two) times daily. Rinse mouth after use. 01/10/19   Laverle Hobby, MD  montelukast (SINGULAIR) 10 MG tablet Take 1 tablet (10 mg total) by mouth at bedtime. 11/15/18   Birdie Sons, MD  Multiple Vitamin (MULTIVITAMIN) tablet Take 1 tablet by mouth daily.    [provider]  Omega-3 Fatty Acids (FISH OIL) 1000 MG CAPS Take 1 capsule by mouth 2 (two) times daily.  12/05/08   [provider]  pantoprazole (PROTONIX) 40 MG tablet TAKE 1 TABLET BY MOUTH EVERY DAY 05/30/18   Birdie Sons, MD  rosuvastatin (CRESTOR) 40 MG tablet TAKE  1 TABLET(40 MG) BY MOUTH DAILY 09/08/18   Birdie Sons, MD  sertraline (ZOLOFT) 100 MG tablet TAKE 1 TABLET BY MOUTH ONCE DAILY 08/11/18   Birdie Sons, MD    Family History  Problem Relation Age of Onset  . Hypertension Mother   . Diabetes Mother        insulin dependent  . Hyperlipidemia Mother   . Diabetes Other   . Hypertension Other   . Heart disease Other   . Arthritis Other      Social History   Tobacco Use  . Smoking status: Former Smoker    Packs/day: 1.50    Years: 20.00    Pack years: 30.00    Types: Cigarettes    Quit date: 08/10/2006    Years since quitting: 12.6  . Smokeless tobacco: Never Used  Substance Use Topics  . Alcohol use: No    Alcohol/week: 0.0 standard drinks  . Drug use: No    Allergies as of 03/20/2019 - Review Complete 02/09/2019  Allergen Reaction Noted  . Aleve  [naproxen sodium]  01/29/2015  . Mobic  [meloxicam]  01/29/2015  . Voltaren  [diclofenac sodium]  01/29/2015    Review of Systems:    All systems reviewed and negative except where noted in HPI.   Physical Exam:  There were no vitals taken for this visit. No LMP for male patient. Psych:  Alert and cooperative. Normal mood and affect. General:   Alert,  Well-developed, well-nourished, pleasant and cooperative in NAD Head:  Normocephalic and atraumatic. Eyes:  Sclera clear, no icterus.   Conjunctiva pink. Ears:  Normal auditory acuity. Nose:  No deformity, discharge, or lesions. Mouth:  No deformity or lesions,oropharynx pink & moist. Neck:  Supple; no masses or thyromegaly. Lungs:  Respirations even and unlabored.  Clear throughout to auscultation.   No wheezes, crackles, or rhonchi. No acute distress. Heart:  Regular rate and rhythm; no murmurs, clicks, rubs, or gallops. Abdomen:  Normal bowel sounds.  No bruits.  Soft, non-tender and non-distended without masses, hepatosplenomegaly or hernias noted.  No guarding or rebound tenderness.    Neurologic:  Alert and  oriented x3;  grossly normal neurologically. Skin:  Intact without significant lesions or rashes. No jaundice. Lymph Nodes:  No significant cervical adenopathy. Psych:  Alert and cooperative. Normal mood and affect.  Imaging Studies: No results found.  Assessment and Plan:   Alex [name] is a 53 y.o. y/o male has been referred for incidental finding of liver cirrhosis seen on the CT scan of the chest which was performed to evaluate for interstitial lung disease who he sees Dr. Ashby Dawes for.  Looking at his labs there is no evidence of thrombocytopenia.  I do not have any recent liver function tests particularly to note  the albumin if low or normal.  Do not have any recent PT/INR to determine if abnormal.  No prior evaluation for hepatitis B or C.  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  Long discussion about weight loss and dietary modifications.  Presently his diet contains a significant amount of processed food and fat.  Plan 1.  Complete autoimmune and viral hepatitis work-up.  Obtain liver elastography to determine if he possibly has liver cirrhosis.  Based on the lab work including serum albumin and PT/INR will help [place] to figure out biochemically if he has any features of portal hypertension/liver cirrhosis.  If he does have liver cirrhosis he will need an ultrasound every 6 months to screen for [facility] and an upper endoscopy to screen for esophageal varices.  Follow up in 4 weeks  Dr Jonathon Bellows MD,MRCP(U.K)

## 2019-03-22 LAB — IMMUNOGLOBULINS A/E/G/M, SERUM
IgA/Immunoglobulin A, Serum: 497 mg/dL — ABNORMAL HIGH (ref 90–386)
IgE (Immunoglobulin E), Serum: 6 IU/mL (ref 6–495)
IgG (Immunoglobin G), Serum: 1268 mg/dL (ref 603–1613)
IgM (Immunoglobulin M), Srm: 160 mg/dL (ref 20–172)

## 2019-03-22 LAB — IRON,TIBC AND FERRITIN PANEL
Ferritin: 63 ng/mL (ref 30–400)
Iron Saturation: 20 % (ref 15–55)
Iron: 82 ug/dL (ref 38–169)
Total Iron Binding Capacity: 406 ug/dL (ref 250–450)
UIBC: 324 ug/dL (ref 111–343)

## 2019-03-22 LAB — ANTI-MICROSOMAL ANTIBODY LIVER / KIDNEY: LKM1 Ab: 2 Units (ref 0.0–20.0)

## 2019-03-22 LAB — ANA: Anti Nuclear Antibody (ANA): NEGATIVE

## 2019-03-22 LAB — HEPATITIS B E ANTIGEN: Hep B E Ag: NEGATIVE

## 2019-03-22 LAB — CK: Total CK: 175 U/L (ref 41–331)

## 2019-03-22 LAB — MITOCHONDRIAL/SMOOTH MUSCLE AB PNL
Mitochondrial Ab: <20 Units (ref 0.0–20.0)
Smooth Muscle Ab: 4 Units (ref 0–19)

## 2019-03-22 LAB — HEPATIC FUNCTION PANEL
ALT: 38 IU/L (ref 0–44)
AST: 37 IU/L (ref 0–40)
Albumin: 5.1 g/dL — ABNORMAL HIGH (ref 3.8–4.9)
Alkaline Phosphatase: 76 IU/L (ref 39–117)
Bilirubin Total: 0.5 mg/dL (ref 0.0–1.2)
Bilirubin, Direct: 0.19 mg/dL (ref 0.00–0.40)
Total Protein: 8.3 g/dL (ref 6.0–8.5)

## 2019-03-22 LAB — HEPATITIS B E ANTIBODY: Hep B E Ab: NEGATIVE

## 2019-03-22 LAB — HEPATITIS A ANTIBODY, TOTAL: hep A Total Ab: NEGATIVE

## 2019-03-22 LAB — CELIAC DISEASE AB SCREEN W/RFX
Antigliadin Abs, IgA: 14 units (ref 0–19)
Transglutaminase IgA: <2 U/mL (ref 0–3)

## 2019-03-22 LAB — HEPATITIS C ANTIBODY: Hep C Virus Ab: 0.1 s/co ratio (ref 0.0–0.9)

## 2019-03-22 LAB — PROTIME-INR
INR: 1 (ref 0.8–1.2)
Prothrombin Time: 11.1 s (ref 9.1–12.0)

## 2019-03-22 LAB — HEPATITIS B SURFACE ANTIBODY,QUALITATIVE: Hep B Surface Ab, Qual: NONREACTIVE

## 2019-03-22 LAB — HEPATITIS B CORE ANTIBODY, TOTAL: Hep B Core Total Ab: NEGATIVE

## 2019-03-22 LAB — ALPHA-1-ANTITRYPSIN: A-1 Antitrypsin: 75 mg/dL — ABNORMAL LOW (ref 101–187)

## 2019-03-22 LAB — CERULOPLASMIN: Ceruloplasmin: 29 mg/dL (ref 16.0–31.0)

## 2019-03-22 LAB — HEPATITIS B SURFACE ANTIGEN: Hepatitis B Surface Ag: NEGATIVE

## 2019-03-22 LAB — HIV ANTIBODY (ROUTINE TESTING W REFLEX): HIV Screen 4th Generation wRfx: NONREACTIVE

## 2019-03-26 ENCOUNTER — Encounter: Payer: Self-pay | Admitting: Gastroenterology

## 2019-03-27 DIAGNOSIS — E119 Type 2 diabetes mellitus without complications: Secondary | ICD-10-CM | POA: Diagnosis not present

## 2019-03-27 NOTE — Progress Notes (Signed)
Interesting, I dont see much emphysema on his CT chest, we are waiting for a PFT to confirm. He has some interstitial lung disease, though this is not usually related to alpha-1 deficiency.  Unless he has other contributing causes, this may be contributing to his liver disease.

## 2019-03-28 ENCOUNTER — Other Ambulatory Visit: Payer: Self-pay

## 2019-03-28 ENCOUNTER — Telehealth: Payer: Self-pay | Admitting: Internal Medicine

## 2019-03-28 NOTE — Telephone Encounter (Signed)
Pt is aware of covid date/time. Nothing further is needed.

## 2019-03-29 ENCOUNTER — Ambulatory Visit
Admission: RE | Admit: 2019-03-29 | Discharge: 2019-03-29 | Disposition: A | Discharge status: Home / Self Care | Payer: BC Managed Care – PPO | Source: Ambulatory Visit | Attending: Gastroenterology | Admitting: Gastroenterology

## 2019-03-29 ENCOUNTER — Ambulatory Visit: Payer: BC Managed Care – PPO

## 2019-03-29 ENCOUNTER — Other Ambulatory Visit: Payer: Self-pay

## 2019-03-29 DIAGNOSIS — K7460 Unspecified cirrhosis of liver: Secondary | ICD-10-CM | POA: Diagnosis not present

## 2019-03-30 ENCOUNTER — Telehealth: Payer: Self-pay

## 2019-03-30 NOTE — Telephone Encounter (Signed)
Spoke with pt and informed him of lab results and Dr. Georgeann Oppenheim suggestion for pt to receive the Hep A/B vaccine. I explained that we offer the Hep A/B combination vaccine at our office, Pt agrees and wants to receive his first vaccination during his 04-19-19 office visit.

## 2019-03-30 NOTE — Telephone Encounter (Signed)
-----   Message from Jonathon Bellows, MD sent at 03/26/2019 12:04 PM EDT ----- Sherald Hess inform LFT's are normal - needs Hep A/B vaccine  Dr Ashby Dawes I note you are evaluating him for ILD- his alpha 1 antitrysin levels are low- do you think this may be relevant for his lung and liver issues?  Dr Jonathon Bellows MD,MRCP [facility]) Gastroenterology/Hepatology Pager: [phone]

## 2019-03-31 ENCOUNTER — Other Ambulatory Visit
Admission: RE | Admit: 2019-03-31 | Discharge: 2019-03-31 | Disposition: A | Discharge status: Home / Self Care | Payer: BC Managed Care – PPO | Source: Ambulatory Visit | Attending: Internal Medicine | Admitting: Internal Medicine

## 2019-03-31 ENCOUNTER — Other Ambulatory Visit: Payer: Self-pay

## 2019-03-31 ENCOUNTER — Other Ambulatory Visit: Payer: BLUE CROSS/BLUE SHIELD

## 2019-03-31 DIAGNOSIS — Z20828 Contact with and (suspected) exposure to other viral communicable diseases: Secondary | ICD-10-CM | POA: Diagnosis not present

## 2019-03-31 DIAGNOSIS — Z01812 Encounter for preprocedural laboratory examination: Secondary | ICD-10-CM | POA: Insufficient documentation

## 2019-03-31 LAB — SARS CORONAVIRUS 2 (TAT 6-24 HRS): SARS Coronavirus 2: NEGATIVE

## 2019-04-02 ENCOUNTER — Encounter: Payer: Self-pay | Admitting: Gastroenterology

## 2019-04-04 ENCOUNTER — Other Ambulatory Visit: Payer: Self-pay

## 2019-04-04 ENCOUNTER — Ambulatory Visit: Payer: BC Managed Care – PPO | Attending: Internal Medicine

## 2019-04-04 ENCOUNTER — Encounter: Payer: Self-pay | Admitting: Family Medicine

## 2019-04-04 DIAGNOSIS — K7460 Unspecified cirrhosis of liver: Secondary | ICD-10-CM | POA: Insufficient documentation

## 2019-04-04 DIAGNOSIS — K760 Fatty (change of) liver, not elsewhere classified: Secondary | ICD-10-CM | POA: Insufficient documentation

## 2019-04-04 DIAGNOSIS — J449 Chronic obstructive pulmonary disease, unspecified: Secondary | ICD-10-CM | POA: Diagnosis not present

## 2019-04-04 MED ORDER — ALBUTEROL SULFATE (2.5 MG/3ML) 0.083% IN NEBU
2.5000 mg | INHALATION_SOLUTION | Freq: Once | RESPIRATORY_TRACT | Status: AC
  Administered 2019-04-04: 16:00:00 2.5 mg via RESPIRATORY_TRACT
  Filled 2019-04-04: qty 3

## 2019-04-05 ENCOUNTER — Telehealth: Payer: Self-pay

## 2019-04-05 NOTE — Telephone Encounter (Signed)
-----   Message from Jonathon Bellows, MD sent at 04/02/2019 11:31 AM EDT ----- Sherald Hess inform USG shows cirrhosis- we will discuss further at upcoming visit. In the meanwhile recommend EGD to screen for esophageal varices - he can schedule it if he is ok to go ahead or if wishes to discuss can do so at office visit  Dr Jonathon Bellows MD,MRCP [facility]) Gastroenterology/Hepatology Pager: [phone]

## 2019-04-05 NOTE — Telephone Encounter (Signed)
Spoke with pt and informed him of U/S results and Dr. Georgeann Oppenheim suggestions. Pt states he'd like to discuss the results with Dr. Vicente Males before scheduling the EGD. Pt has been scheduled for an office visit.

## 2019-04-06 ENCOUNTER — Ambulatory Visit (INDEPENDENT_AMBULATORY_CARE_PROVIDER_SITE_OTHER): Payer: BC Managed Care – PPO | Admitting: Gastroenterology

## 2019-04-06 ENCOUNTER — Other Ambulatory Visit: Payer: Self-pay

## 2019-04-06 VITALS — BP 133/78 | HR 84 | Temp 97.8°F | Ht 71.0 in | Wt 236.8 lb

## 2019-04-06 DIAGNOSIS — K7460 Unspecified cirrhosis of liver: Secondary | ICD-10-CM | POA: Diagnosis not present

## 2019-04-06 DIAGNOSIS — Z23 Encounter for immunization: Secondary | ICD-10-CM

## 2019-04-06 DIAGNOSIS — E8801 Alpha-1-antitrypsin deficiency: Secondary | ICD-10-CM | POA: Diagnosis not present

## 2019-04-06 NOTE — Addendum Note (Signed)
Addended by: Dorethea Clan on: 04/06/2019 02:32 PM   Modules accepted: Orders

## 2019-04-06 NOTE — Progress Notes (Signed)
Jonathon Bellows MD, MRCP(U.K) [address]  [place]  [place], [place] [address]  Main: [phone]  Fax: [phone]   Primary Care Physician: Birdie Sons, MD  Primary Gastroenterologist:  Dr. Jonathon Bellows   No chief complaint on file.   HPI: Alex [name] is a 53 y.o. male was initially referred on 03/20/2023 cirrhosis of the liver by Dr. Ashby Dawes.  He is followed at the pulmonary clinic for interstitial lung disease.  Incidentally on a CT scan of the chest was noted to have possible liver cirrhosis.  ANA negative serum ACE level is low at 8.  Platelet count in May 2020 is normal.  Creatinine 1.1.  No recent PT or INR.  No recent hepatitis serology.  He denies any prior liver disease.  His father died from liver cirrhosis secondary to fatty liver disease.  He states that the present weight is the heaviest he has weighed at 238 pounds.  He denies any excess alcohol consumption, incarceration, tattoos, blood transfusions.    Interval history   03/20/2019-04/06/2019  03/20/2019: LFTs normal needs hepatitis A and B vaccine as not immune.  HIV negative, ANA, smooth muscle antibody, antimitochondrial antibody, ceruloplasmin, iron studies, celiac serology, negative or normal.  Alpha-1 antitrypsin levels were low at 75.  Anti-LK M antibody was normal CK was normal at 175 and INR was 1.0 03/29/2019 liver elastography shows some F2 and F3 areas of fibrosis   Since his last visit he has changed his diet completely actively consuming low sodium in his diet.  Avoiding all foods rich in carbohydrates and refined.  Baking his food and avoiding greasy food.  He has lost some weight.  He feels much better.  Current Outpatient Medications  Medication Sig Dispense Refill  . albuterol (PROVENTIL HFA;VENTOLIN HFA) 108 (90 Base) MCG/ACT inhaler Inhale 1-2 puffs into the lungs every 6 (six) hours as needed for shortness of breath. 1 Inhaler 2  . benzonatate (TESSALON) 200 MG capsule Take 1  capsule (200 mg total) by mouth 2 (two) times daily as needed for cough. (Patient not taking: Reported on 03/20/2019) 20 capsule 0  . clopidogrel (PLAVIX) 75 MG tablet TAKE 1 TABLET BY MOUTH EVERY DAY 90 tablet 4  . Coenzyme Q10 (COQ10) 200 MG CAPS Take 1 capsule by mouth 2 (two) times daily.    . Cyanocobalamin (VITAMIN B 12) 250 MCG LOZG Take by mouth.    . fluticasone (FLONASE) 50 MCG/ACT nasal spray Place into the nose.    Marland Kitchen glucose blood test strip     . HYDROcodone-homatropine (HYCODAN) 5-1.5 MG/5ML syrup Take 5 mLs by mouth every 8 (eight) hours as needed. (Patient not taking: Reported on 03/20/2019) 100 mL 0  . insulin regular human CONCENTRATED (HUMULIN R) 500 UNIT/ML injection Use up to 250 units (as calculated by U100 syringe) daily via insulin pump    . lisinopril (PRINIVIL,ZESTRIL) 10 MG tablet Take 1 tablet (10 mg total) by mouth daily. 90 tablet 1  . LORazepam (ATIVAN) 0.5 MG tablet Take by mouth. 1-2 every 6 hours as needed    . losartan (COZAAR) 25 MG tablet Take 1 tablet (25 mg total) by mouth daily. Take in place of lisinopril (Patient not taking: Reported on 03/20/2019) 30 tablet 11  . metFORMIN (GLUCOPHAGE) 500 MG tablet Take 500 mg by mouth 2 (two) times daily.     . metoCLOPramide (REGLAN) 10 MG tablet Take 1 tablet (10 mg total) by mouth 4 (four) times daily as needed. [place]  tablet 5  . mometasone-formoterol (DULERA) 200-5 MCG/ACT AERO Inhale 2 puffs into the lungs 2 (two) times daily. Rinse mouth after use. 1 Inhaler 3  . montelukast (SINGULAIR) 10 MG tablet Take 1 tablet (10 mg total) by mouth at bedtime. 30 tablet 3  . Multiple Vitamin (MULTIVITAMIN) tablet Take 1 tablet by mouth daily.    . Omega-3 Fatty Acids (FISH OIL) 1000 MG CAPS Take 1 capsule by mouth 2 (two) times daily.     . pantoprazole (PROTONIX) 40 MG tablet TAKE 1 TABLET BY MOUTH EVERY DAY 90 tablet 4  . rosuvastatin (CRESTOR) 40 MG tablet TAKE 1 TABLET(40 MG) BY MOUTH DAILY 90 tablet 4  . sertraline (ZOLOFT) 100  MG tablet TAKE 1 TABLET BY MOUTH ONCE DAILY 90 tablet 4   No current facility-administered medications for this visit.     Allergies as of 04/06/2019 - Review Complete 03/20/2019  Allergen Reaction Noted  . Aleve  [naproxen sodium]  01/29/2015  . Mobic  [meloxicam]  01/29/2015  . Voltaren  [diclofenac sodium]  01/29/2015    ROS:  General: Negative for anorexia, weight loss, fever, chills, fatigue, weakness. ENT: Negative for hoarseness, difficulty swallowing , nasal congestion. CV: Negative for chest pain, angina, palpitations, dyspnea on exertion, peripheral edema.  Respiratory: Negative for dyspnea at rest, dyspnea on exertion, cough, sputum, wheezing.  GI: See history of present illness. GU:  Negative for dysuria, hematuria, urinary incontinence, urinary frequency, nocturnal urination.  Endo: Negative for unusual weight change.    Physical Examination:   There were no vitals taken for this visit.  General: Well-nourished, well-developed in no acute distress.  Eyes: No icterus. Conjunctivae pink. Mouth: Oropharyngeal mucosa moist and pink , no lesions erythema or exudate. Lungs: Clear to auscultation bilaterally. Non-labored. Heart: Regular rate and rhythm, no murmurs rubs or gallops.  Abdomen: Bowel sounds are normal, nontender, nondistended, no hepatosplenomegaly or masses, no abdominal bruits or hernia , no rebound or guarding.   Extremities: No lower extremity edema. No clubbing or deformities. Neuro: Alert and oriented x 3.  Grossly intact. Skin: Warm and dry, no jaundice.   Psych: Alert and cooperative, normal mood and affect.   Imaging Studies: [place] Elastography Liver  Result Date: 03/29/2019 CLINICAL DATA:  Cirrhosis EXAM: US LIVER ELASTOGRAPHY TECHNIQUE: Ultrasound elastography evaluation of the liver was performed. A region of interest was placed in the right lobe of the liver. Following application of a compressive sonographic pulse, shear waves were detected in  the adjacent hepatic tissue and the shear wave velocity was calculated. Multiple assessments were performed at the selected site. Median shear wave velocity is correlated to a Metavir fibrosis score. COMPARISON:  None. FINDINGS: Liver: Increased echotexture throughout the liver compatible with fatty infiltration or intrinsic liver disease. Mildly nodular contours compatible with cirrhosis. No focal hepatic abnormality or biliary ductal dilatation. Portal vein is patent on color Doppler imaging with normal direction of blood flow towards the liver. ULTRASOUND HEPATIC ELASTOGRAPHY Device: Siemens Helix VTQ Patient position: Supine Transducer: 5C1 Number of measurements: 10 Hepatic segment:  8 Median velocity:   1.94 m/sec IQR: 0.19 IQR/Median velocity ratio: 0.10 Corresponding Metavir fibrosis score:  F2 + some F3 Risk of fibrosis: Moderate Limitations of exam: None Please note that abnormal shear wave velocities may also be identified in clinical settings other than with hepatic fibrosis, such as: acute hepatitis, elevated right heart and central venous pressures including use of beta blockers, veno-occlusive disease (Budd-Chiari), infiltrative processes such as mastocytosis/amyloidosis/infiltrative tumor, extrahepatic cholestasis,  in the post-prandial state, and liver transplantation. Correlation with patient history, laboratory data, and clinical condition recommended. IMPRESSION: Liver: Morphologic features compatible with cirrhosis. Elastography: Median hepatic shear wave velocity is calculated at 1.94 m/sec. Corresponding Metavir fibrosis score is F2 + some F3. Risk of fibrosis is Moderate. Follow-up: Additional testing appropriate Electronically Signed   By: Rolm Baptise M.D.   On: 03/29/2019 08:22    Assessment and Plan:   HAKOP KIRT is a 53 y.o. y/o male here to follow-up for  liver cirrhosis seen on the CT scan of the chest which was performed to evaluate for interstitial lung disease who he sees Dr.  Ashby Dawes for.    He has normal synthetic function with a normal INR and albumin.  His platelet count is not low.  Therefore his synthetic liver function is good.    He may M nonalcoholic fatty liver disease in addition to a secondary process.  I do note that his alpha-1 antitrypsin levels are low.  I would suggest the patient to see Dr. Ashby Dawes back at his clinic to discuss whether this could have a bearing on the etiology for his lung and liver disease.. Since her last visit he of change his diet significantly and has been losing weight.  Congratulated him on the progress.  Liver elastography shows F2 and F3 fibrosis.  Plan 1.    Vaccination for hepatitis A and B 2.  EGD screen for esophageal varices 3.  Right upper quadrant ultrasound in November 2020 4.  Follow-up with Dr. Ashby Dawes in pulmonary clinic to determine if he has alpha-1 antitrypsin deficiency as evidenced by low alpha-1 antitrypsin level on his recent labs. 5.  LFTs and INR to be checked in 6 months. 6.  Check alpha-1 antitrypsin phenotype  I have discussed alternative options, risks & benefits,  which include, but are not limited to, bleeding, infection, perforation,respiratory complication & drug reaction.  The patient agrees with this plan & written consent will be obtained.     Follow-up in 6 months  Dr Jonathon Bellows  MD,MRCP [facility])

## 2019-04-07 NOTE — Progress Notes (Signed)
[facility]     Assessment and Plan:  Chronic bronchitis with dyspnea and cough. - Improved dyspnea and cough.  - Appears to have been steroid responsive. - Continue Dulera inhaler, Singulair.  Alpha-1 antitrypsin deficiency. - Low alpha-1 level with SZ phenotype. - Discussed possible alpha-1 augmentation.  He would like to hold off for now consider it.  He appears to have minimal emphysematous lung disease, therefore a watch and wait strategy with yearly lung function testing may be adequate. - Patient has evidence of cirrhosis, and is being followed by gastroenterology.  He is following through with dietary modification and has lost some weight.  Interstitial lung disease. - ACE level, serology negative. - Appears to be early/mild disease will continue to monitor with CT imaging once yearly. Next due in summer of 2021.    Diabetes mellitus, coronary artery disease. - Patient is currently on insulin pump, will need to consider this and management if patient requires long-term prednisone.   Orders Placed This Encounter  Procedures   Flu Vaccine QUAD 36+ mos IM  Flu vaccine given today 04/10/2019   Return in about 3 months (around 07/10/2019).    Date: 04/07/2019  MRN# [id number] Alex [name] Sep 14, 1965      HPI:  Alex [name] is a 53 y.o. male  with symptoms of cough and congestion for about 4 months, which have responded well to prednisone but not Ventolin.  He is a former smoker.  CT high resolution showed some early micro-nodularity, possible cirrhosis, early changes of traction bronchiectasis, air trapping.    At last visit he was maintained on Dulera,singulair.  He was stopped on his lisinopril and started on losartan due to persistent cough.  Given the presence of cirrhosis patient alpha-1 was also checked, this showed low alpha-1 level with phenotype consistent with alpha-1 antitrypsin deficiency SZ.  He was off the lisinopril and there was no  change with the cough, so he went back on it, since then he thinks that the cough has gotten better.  He is using dulera 2 puffs bid and rinses mouth. He is using singulair. He does flonase.   He is taking dulera inhaler 2 puffs twice daily, he is off the singulair since getting the rast test.  He continues to have excess mucus production, and constant sinus drainage.  He was taking flonase a while back.   He has not smoked since 2008.   He first noticed problems about 3 months ago with cough and chest congestion. He has a history of MI and went to his cardiologist, had a stress test which showed no abnormalities of the heart. He has been tried on different cough and allergy medications, but since then the problems have persisted. He got a chest xray which showed, he was ordered a course of prednisone taper, which has helped "tons" and almost back to normal.  Tessalon or hycodan, did not help, buckleys cough syrup helped. He has been on lisinopril for several years, has not been stopped.  The symptoms started as a cold and have persisted since then. Since then he has been having dyspnea on exertion, he has been tried on ventolin, but stopped because it helped only a little bit. He currently has a bit of phelgm currently.  He has smoked, 1.5 ppd until 2008. 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  No family lung problems, no personal or family history  of cancer.  He has a dog and cat, dog in bedroom.  He has not been tested for allergies.  He takes protonix for heartburn.   **Alpha -1  02/09/19>> level is low at 80, phenotype is SZ. **PFT 04/04/2019>> tracings personally reviewed, FVC is 63% predicted, FEV1 is 64% predicted, no bronchodilator was administered.  Ratio is 102%.  TLC is 77 predicted, RV is 106%, ratio 137%.  DLCO 69% predicted.  The flow volume loop is not presented.  Overall this test is suggestive of moderate restrictive lung  disease. **Rest 02/03/2019>> negative, IgE 6. **Serology 01/10/19>> negative (see below). **ACE level 01/06/2019>> negative. **CT chest hi-res 01/06/2019>> images personally viewed, tiny groundglass nodularity seen in both lungs, there is early evidence of traction bronchiectasis and some air trapping.  Per radiology report, there is suggestions of cirrhosis, hepatic steatosis. Chest x-ray 12/05/2018>> images personally reviewed, bilateral interstitial infiltrates, worst in the left base.  Advance from previous films from 2009 and 2008. Echocardiogram 10/19/18 >> Left ventricular cavity: normal. LVEF= 61%  Results for Alex, [name] (MRN [id number]) as of 04/07/2019 09:32  Ref. Range 03/20/2019 14:50  Anti Nuclear Antibody (ANA) Latest Ref Range: Negative  Negative  LKM1 Ab Latest Ref Range: 0.0 - 20.0 Units 2.0  Mitochondrial Ab Latest Ref Range: 0.0 - 20.0 Units <20.0  Smooth Muscle Ab Latest Ref Range: 0 - 19 Units 4  Transglutaminase IgA Latest Ref Range: 0 - 3 U/mL <2  Antigliadin Abs, IgA Latest Ref Range: 0 - 19 units 14  IgG (Immunoglobin G), Serum Latest Ref Range: 603 - 1,613 mg/dL 1,268  IgE (Immunoglobulin E), Serum Latest Ref Range: 6 - 495 IU/mL 6  IgM (Immunoglobulin M), Srm Latest Ref Range: 20 - 172 mg/dL 160  IgA/Immunoglobulin A, Serum Latest Ref Range: 90 - 386 mg/dL 497 (H)   Results for Alex, [name] (MRN [id number]) as of 04/07/2019 09:32  Ref. Range 01/10/2019 10:58  Ribonucleic Protein Latest Ref Range: 0.0 - 0.9 AI <0.2  ENA SM Ab Ser-aCnc Latest Ref Range: 0.0 - 0.9 AI <0.2  SSA (Ro) (ENA) Antibody, IgG Latest Ref Range: 0.0 - 0.9 AI <0.2  SSB (La) (ENA) Antibody, IgG Latest Ref Range: 0.0 - 0.9 AI <0.2  Scleroderma (Scl-70) (ENA) Antibody, IgG Latest Ref Range: 0.0 - 0.9 AI <0.2    Medication:    Current Outpatient Medications:    albuterol (PROVENTIL HFA;VENTOLIN HFA) 108 (90 Base) MCG/ACT inhaler, Inhale 1-2 puffs into the lungs every 6 (six) hours as needed for  shortness of breath., Disp: 1 Inhaler, Rfl: 2   benzonatate (TESSALON) 200 MG capsule, Take 1 capsule (200 mg total) by mouth 2 (two) times daily as needed for cough. (Patient not taking: Reported on 03/20/2019), Disp: 20 capsule, Rfl: 0   clopidogrel (PLAVIX) 75 MG tablet, TAKE 1 TABLET BY MOUTH EVERY DAY, Disp: 90 tablet, Rfl: 4   Coenzyme Q10 (COQ10) 200 MG CAPS, Take 1 capsule by mouth 2 (two) times daily., Disp: , Rfl:    Cyanocobalamin (VITAMIN B 12) 250 MCG LOZG, Take by mouth., Disp: , Rfl:    fluticasone (FLONASE) 50 MCG/ACT nasal spray, Place into the nose., Disp: , Rfl:    glucose blood test strip, , Disp: , Rfl:    HYDROcodone-homatropine (HYCODAN) 5-1.5 MG/5ML syrup, Take 5 mLs by mouth every 8 (eight) hours as needed. (Patient not taking: Reported on 03/20/2019), Disp: 100 mL, Rfl: 0   insulin regular human CONCENTRATED (HUMULIN R) 500 UNIT/ML injection, Use  up to 250 units (as calculated by U100 syringe) daily via insulin pump, Disp: , Rfl:    lisinopril (PRINIVIL,ZESTRIL) 10 MG tablet, Take 1 tablet (10 mg total) by mouth daily., Disp: 90 tablet, Rfl: 1   LORazepam (ATIVAN) 0.5 MG tablet, Take by mouth. 1-2 every 6 hours as needed, Disp: , Rfl:    losartan (COZAAR) 25 MG tablet, Take 1 tablet (25 mg total) by mouth daily. Take in place of lisinopril (Patient not taking: Reported on 03/20/2019), Disp: 30 tablet, Rfl: 11   metFORMIN (GLUCOPHAGE) 500 MG tablet, Take 500 mg by mouth 2 (two) times daily. , Disp: , Rfl:    metoCLOPramide (REGLAN) 10 MG tablet, Take 1 tablet (10 mg total) by mouth 4 (four) times daily as needed., Disp: 30 tablet, Rfl: 5   mometasone-formoterol (DULERA) 200-5 MCG/ACT AERO, Inhale 2 puffs into the lungs 2 (two) times daily. Rinse mouth after use., Disp: 1 Inhaler, Rfl: 3   montelukast (SINGULAIR) 10 MG tablet, Take 1 tablet (10 mg total) by mouth at bedtime., Disp: 30 tablet, Rfl: 3   Multiple Vitamin (MULTIVITAMIN) tablet, Take 1 tablet by mouth  daily., Disp: , Rfl:    Omega-3 Fatty Acids (FISH OIL) 1000 MG CAPS, Take 1 capsule by mouth 2 (two) times daily. , Disp: , Rfl:    pantoprazole (PROTONIX) 40 MG tablet, TAKE 1 TABLET BY MOUTH EVERY DAY, Disp: 90 tablet, Rfl: 4   rosuvastatin (CRESTOR) 40 MG tablet, TAKE 1 TABLET(40 MG) BY MOUTH DAILY, Disp: 90 tablet, Rfl: 4   sertraline (ZOLOFT) 100 MG tablet, TAKE 1 TABLET BY MOUTH ONCE DAILY, Disp: 90 tablet, Rfl: 4   Allergies:  Aleve  [naproxen sodium], Mobic  [meloxicam], and Voltaren  [diclofenac sodium]    LABORATORY PANEL:   CBC No results for input(s): WBC, HGB, HCT, PLT in the last 168 hours. ------------------------------------------------------------------------------------------------------------------  Chemistries  No results for input(s): NA, K, CL, CO2, GLUCOSE, BUN, CREATININE, CALCIUM, MG, AST, ALT, ALKPHOS, BILITOT in the last 168 hours.  Invalid input(s): GFRCGP ------------------------------------------------------------------------------------------------------------------  Cardiac Enzymes No results for input(s): TROPONINI in the last 168 hours. ------------------------------------------------------------  RADIOLOGY:  No results found.     Thank  you for the consultation and for allowing [facility] to assist in the care of your patient. Our recommendations are noted above.  Please contact us if we can be of further service.   Marda Stalker, M.D., F.C.C.P.  Board Certified in Internal Medicine, Pulmonary Medicine, [place], and Sleep Medicine.  [facility] Office Number: [phone]   04/07/2019

## 2019-04-10 ENCOUNTER — Other Ambulatory Visit: Payer: Self-pay

## 2019-04-10 ENCOUNTER — Encounter: Payer: Self-pay | Admitting: Family Medicine

## 2019-04-10 ENCOUNTER — Encounter: Payer: Self-pay | Admitting: Internal Medicine

## 2019-04-10 ENCOUNTER — Ambulatory Visit (INDEPENDENT_AMBULATORY_CARE_PROVIDER_SITE_OTHER): Payer: BC Managed Care – PPO | Admitting: Internal Medicine

## 2019-04-10 VITALS — BP 128/68 | HR 85 | Temp 97.7°F | Ht 71.0 in | Wt 232.0 lb

## 2019-04-10 DIAGNOSIS — J849 Interstitial pulmonary disease, unspecified: Secondary | ICD-10-CM

## 2019-04-10 DIAGNOSIS — E8801 Alpha-1-antitrypsin deficiency: Secondary | ICD-10-CM

## 2019-04-10 DIAGNOSIS — Z23 Encounter for immunization: Secondary | ICD-10-CM

## 2019-04-10 LAB — ALPHA-1 ANTITRYPSIN PHENOTYPE: A-1 Antitrypsin: 74 mg/dL — ABNORMAL LOW (ref 101–187)

## 2019-04-10 NOTE — Patient Instructions (Addendum)
We will consider doing an alpha-1 infusions in the future.  You have alpha-1 antitrypsin deficiency. [email or web address].ca has some good information to learn more about it.  Continue dulera, singulair, flonase.

## 2019-04-19 ENCOUNTER — Ambulatory Visit: Payer: BC Managed Care – PPO | Admitting: Gastroenterology

## 2019-04-20 ENCOUNTER — Other Ambulatory Visit: Payer: Self-pay

## 2019-04-20 ENCOUNTER — Telehealth: Payer: Self-pay

## 2019-04-20 DIAGNOSIS — K7460 Unspecified cirrhosis of liver: Secondary | ICD-10-CM

## 2019-04-20 NOTE — Telephone Encounter (Signed)
Spoke with pt yesterday and informed him of lab result and Dr. Georgeann Oppenheim recommendations. Pt agrees.

## 2019-04-20 NOTE — Telephone Encounter (Signed)
-----   Message from Jonathon Bellows, MD sent at 04/07/2019  8:14 AM EDT ----- Alex [name]   Inform that repeat check of Alpha 1 antitrypsin still shows low levels. He should discuss with Dr Ashby Dawes if he has a deficiency in the enzyme which can cause liver and lung disease  C.c Fisher, Kirstie Peri, MD , Dr Ashby Dawes   Dr Jonathon Bellows MD,MRCP [facility]) Gastroenterology/Hepatology Pager: [phone]

## 2019-04-21 ENCOUNTER — Telehealth: Payer: Self-pay

## 2019-04-21 NOTE — Telephone Encounter (Signed)
Spoke with pt and informed him that his cardiologist has cleared him for the endoscopy and gave instructions for pt to hold the Plavix 7 days prior to the procedure and resume 4 days after. Pt agrees.

## 2019-04-27 ENCOUNTER — Other Ambulatory Visit: Payer: Self-pay

## 2019-04-27 ENCOUNTER — Other Ambulatory Visit
Admission: RE | Admit: 2019-04-27 | Discharge: 2019-04-27 | Disposition: A | Discharge status: Home / Self Care | Payer: BC Managed Care – PPO | Source: Ambulatory Visit | Attending: Gastroenterology | Admitting: Gastroenterology

## 2019-04-27 DIAGNOSIS — Z20828 Contact with and (suspected) exposure to other viral communicable diseases: Secondary | ICD-10-CM | POA: Diagnosis not present

## 2019-04-27 DIAGNOSIS — Z01812 Encounter for preprocedural laboratory examination: Secondary | ICD-10-CM | POA: Diagnosis not present

## 2019-04-27 LAB — SARS CORONAVIRUS 2 (TAT 6-24 HRS): SARS Coronavirus 2: NEGATIVE

## 2019-04-30 ENCOUNTER — Encounter: Payer: Self-pay | Admitting: Anesthesiology

## 2019-05-01 ENCOUNTER — Ambulatory Visit: Payer: BC Managed Care – PPO | Admitting: Anesthesiology

## 2019-05-01 ENCOUNTER — Other Ambulatory Visit: Payer: Self-pay

## 2019-05-01 ENCOUNTER — Ambulatory Visit
Admission: RE | Admit: 2019-05-01 | Discharge: 2019-05-01 | Disposition: A | Discharge status: Home / Self Care | Payer: BC Managed Care – PPO | Attending: Gastroenterology | Admitting: Gastroenterology

## 2019-05-01 ENCOUNTER — Encounter
Admission: RE | Disposition: A | Discharge status: Home / Self Care | Payer: Self-pay | Source: Home / Self Care | Attending: Gastroenterology

## 2019-05-01 ENCOUNTER — Other Ambulatory Visit: Payer: Self-pay | Admitting: Internal Medicine

## 2019-05-01 ENCOUNTER — Encounter: Payer: Self-pay | Admitting: *Deleted

## 2019-05-01 DIAGNOSIS — E119 Type 2 diabetes mellitus without complications: Secondary | ICD-10-CM | POA: Diagnosis not present

## 2019-05-01 DIAGNOSIS — Z79899 Other long term (current) drug therapy: Secondary | ICD-10-CM | POA: Insufficient documentation

## 2019-05-01 DIAGNOSIS — K7460 Unspecified cirrhosis of liver: Secondary | ICD-10-CM | POA: Diagnosis not present

## 2019-05-01 DIAGNOSIS — F419 Anxiety disorder, unspecified: Secondary | ICD-10-CM | POA: Diagnosis not present

## 2019-05-01 DIAGNOSIS — I2510 Atherosclerotic heart disease of native coronary artery without angina pectoris: Secondary | ICD-10-CM | POA: Diagnosis not present

## 2019-05-01 DIAGNOSIS — Z7902 Long term (current) use of antithrombotics/antiplatelets: Secondary | ICD-10-CM | POA: Insufficient documentation

## 2019-05-01 DIAGNOSIS — Z13810 Encounter for screening for upper gastrointestinal disorder: Secondary | ICD-10-CM | POA: Insufficient documentation

## 2019-05-01 DIAGNOSIS — Z87891 Personal history of nicotine dependence: Secondary | ICD-10-CM | POA: Insufficient documentation

## 2019-05-01 DIAGNOSIS — I129 Hypertensive chronic kidney disease with stage 1 through stage 4 chronic kidney disease, or unspecified chronic kidney disease: Secondary | ICD-10-CM | POA: Diagnosis not present

## 2019-05-01 DIAGNOSIS — Z794 Long term (current) use of insulin: Secondary | ICD-10-CM | POA: Diagnosis not present

## 2019-05-01 DIAGNOSIS — I252 Old myocardial infarction: Secondary | ICD-10-CM | POA: Diagnosis not present

## 2019-05-01 DIAGNOSIS — K219 Gastro-esophageal reflux disease without esophagitis: Secondary | ICD-10-CM | POA: Diagnosis not present

## 2019-05-01 DIAGNOSIS — K2960 Other gastritis without bleeding: Secondary | ICD-10-CM | POA: Diagnosis not present

## 2019-05-01 DIAGNOSIS — E1122 Type 2 diabetes mellitus with diabetic chronic kidney disease: Secondary | ICD-10-CM | POA: Diagnosis not present

## 2019-05-01 DIAGNOSIS — Z955 Presence of coronary angioplasty implant and graft: Secondary | ICD-10-CM | POA: Diagnosis not present

## 2019-05-01 DIAGNOSIS — I10 Essential (primary) hypertension: Secondary | ICD-10-CM | POA: Insufficient documentation

## 2019-05-01 DIAGNOSIS — K2970 Gastritis, unspecified, without bleeding: Secondary | ICD-10-CM | POA: Diagnosis not present

## 2019-05-01 DIAGNOSIS — K319 Disease of stomach and duodenum, unspecified: Secondary | ICD-10-CM | POA: Insufficient documentation

## 2019-05-01 HISTORY — PX: ESOPHAGOGASTRODUODENOSCOPY (EGD) WITH PROPOFOL: SHX5813

## 2019-05-01 HISTORY — DX: Acute myocardial infarction, unspecified: I21.9

## 2019-05-01 HISTORY — DX: Unspecified cirrhosis of liver: K74.60

## 2019-05-01 SURGERY — ESOPHAGOGASTRODUODENOSCOPY (EGD) WITH PROPOFOL
Anesthesia: General

## 2019-05-01 MED ORDER — LIDOCAINE HCL (CARDIAC) PF 100 MG/5ML IV SOSY
PREFILLED_SYRINGE | INTRAVENOUS | Status: DC | PRN
  Administered 2019-05-01: 80 mg via INTRATRACHEAL

## 2019-05-01 MED ORDER — PROPOFOL 10 MG/ML IV BOLUS
INTRAVENOUS | Status: DC | PRN
  Administered 2019-05-01: 60 mg via INTRAVENOUS

## 2019-05-01 MED ORDER — PROPOFOL 500 MG/50ML IV EMUL
INTRAVENOUS | Status: DC | PRN
  Administered 2019-05-01: 150 ug/kg/min via INTRAVENOUS

## 2019-05-01 MED ORDER — SODIUM CHLORIDE 0.9 % IV SOLN
INTRAVENOUS | Status: DC
  Administered 2019-05-01: 1000 mL via INTRAVENOUS

## 2019-05-01 NOTE — Anesthesia Preprocedure Evaluation (Addendum)
Anesthesia Evaluation  Patient identified by MRN, date of birth, ID band Patient awake    Reviewed: Allergy & Precautions, NPO status , Patient's Chart, lab work & pertinent test results, reviewed documented beta blocker date and time   Airway Mallampati: III  TM Distance: >3 FB     Dental  (+) Chipped   Pulmonary former smoker,           Cardiovascular hypertension, + CAD, + Past MI and + Cardiac Stents       Neuro/Psych PSYCHIATRIC DISORDERS Anxiety    GI/Hepatic GERD  ,  Endo/Other  diabetes, Type 2  Renal/GU Renal disease     Musculoskeletal   Abdominal   Peds  Hematology   Anesthesia Other Findings Obese. iRbbb.  Reproductive/Obstetrics                             Anesthesia Physical Anesthesia Plan  ASA: III  Anesthesia Plan: General   Post-op Pain Management:    Induction: Intravenous  PONV Risk Score and Plan:   Airway Management Planned:   Additional Equipment:   Intra-op Plan:   Post-operative Plan:   Informed Consent: I have reviewed the patients History and Physical, chart, labs and discussed the procedure including the risks, benefits and alternatives for the proposed anesthesia with the patient or authorized representative who has indicated his/her understanding and acceptance.       Plan Discussed with: CRNA  Anesthesia Plan Comments:         Anesthesia Quick Evaluation

## 2019-05-01 NOTE — Anesthesia Post-op Follow-up Note (Signed)
Anesthesia QCDR form completed.        

## 2019-05-01 NOTE — Op Note (Signed)
[facility] Gastroenterology Patient Name: Alex [name] Procedure Date: 05/01/2019 9:00 AM MRN: [id number] Account #: [id number] Date of Birth: 18-Apr-1966 Admit Type: Outpatient Age: 53 Room: [facility] ENDO ROOM 4 Gender: Male Note Status: Finalized Procedure:            Upper GI endoscopy Indications:          Cirrhosis rule out esophageal varices Providers:            Jonathon Bellows MD, MD Referring MD:         Kirstie Peri. Caryn Section, MD (Referring MD) Medicines:            Monitored Anesthesia Care Complications:        No immediate complications. Procedure:            Pre-Anesthesia Assessment:                       - Prior to the procedure, a History and Physical was                        performed, and patient medications, allergies and                        sensitivities were reviewed. The patient's tolerance of                        previous anesthesia was reviewed.                       - The risks and benefits of the procedure and the                        sedation options and risks were discussed with the                        patient. All questions were answered and informed                        consent was obtained.                       - ASA Grade Assessment: III - A patient with severe                        systemic disease.                       After obtaining informed consent, the endoscope was                        passed under direct vision. Throughout the procedure,                        the patient's blood pressure, pulse, and oxygen                        saturations were monitored continuously. The Endoscope                        was introduced through the mouth, and advanced to the  third part of duodenum. The upper GI endoscopy was                        accomplished with ease. The patient tolerated the                        procedure well. Findings:      The examined duodenum was normal.      The esophagus was  normal.      Localized mild inflammation characterized by congestion (edema) and       erythema was found in the gastric antrum. Biopsies were taken with a       cold forceps for histology.      The cardia and gastric fundus were normal on retroflexion. Impression:           - Normal examined duodenum.                       - Normal esophagus.                       - Gastritis. Biopsied. Recommendation:       - Await pathology results.                       - Discharge patient to home (with escort).                       - Resume previous diet.                       - Continue present medications.                       - Repeat upper endoscopy in 3 years for surveillance.                       - Return to GI office as previously scheduled. Procedure Code(s):    --- Professional ---                       [id number], Esophagogastroduodenoscopy, flexible, transoral;                        with biopsy, single or multiple Diagnosis Code(s):    --- Professional ---                       K29.70, Gastritis, unspecified, without bleeding                       K74.60, Unspecified cirrhosis of liver CPT copyright 2019 American Medical Association. All rights reserved. The codes documented in this report are preliminary and upon coder review may  be revised to meet current compliance requirements. Jonathon Bellows, MD Jonathon Bellows MD, MD 05/01/2019 9:12:06 AM This report has been signed electronically. Number of Addenda: 0 Note Initiated On: 05/01/2019 9:00 AM Estimated Blood Loss: Estimated blood loss: none.      [facility]

## 2019-05-01 NOTE — Transfer of Care (Signed)
Immediate Anesthesia Transfer of Care Note  Patient: [name]  Procedure(s) Performed: ESOPHAGOGASTRODUODENOSCOPY (EGD) WITH PROPOFOL (N/A )  Patient Location: PACU  Anesthesia Type:General  Level of Consciousness: awake, alert  and oriented  Airway & Oxygen Therapy: Patient Spontanous Breathing and Patient connected to face mask oxygen  Post-op Assessment: Report given to RN, Post -op Vital signs reviewed and stable and Patient moving all extremities X 4  Post vital signs: Reviewed and stable  Last Vitals:  Vitals Value Taken Time  BP 113/63 05/01/19 0915  Temp 36.1 C 05/01/19 0915  Pulse 76 05/01/19 0915  Resp 23 05/01/19 0915  SpO2 98 % 05/01/19 0915  Vitals shown include unvalidated device data.  Last Pain:  Vitals:   05/01/19 0915  TempSrc: Tympanic  PainSc:          Complications: No apparent anesthesia complications

## 2019-05-01 NOTE — Anesthesia Postprocedure Evaluation (Signed)
Anesthesia Post Note  Patient: Alex [name]  Procedure(s) Performed: ESOPHAGOGASTRODUODENOSCOPY (EGD) WITH PROPOFOL (N/A )  Patient location during evaluation: Endoscopy Anesthesia Type: General Level of consciousness: awake and alert Pain management: pain level controlled Vital Signs Assessment: post-procedure vital signs reviewed and stable Respiratory status: spontaneous breathing, nonlabored ventilation, respiratory function stable and patient connected to nasal cannula oxygen Cardiovascular status: blood pressure returned to baseline and stable Postop Assessment: no apparent nausea or vomiting Anesthetic complications: no     Last Vitals:  Vitals:   05/01/19 0915 05/01/19 0956  BP: 113/63 128/70  Pulse:  80  Resp: (!) 23 16  Temp: (!) 36.1 C   SpO2:      Last Pain:  Vitals:   05/01/19 0956  TempSrc:   PainSc: 0-No pain                 Nikolette Reindl S

## 2019-05-01 NOTE — H&P (Signed)
Jonathon Bellows, MD [address] [place], [place], [place], [address] [address], [place], [place], [place], [address] Phone: [phone]  Fax: [phone]  Primary Care Physician:  Birdie Sons, MD   Pre-Procedure History & Physical: HPI:  Alex [name] is a 53 y.o. male is here for an endoscopy    Past Medical History:  Diagnosis Date  . Allergy   . Cirrhosis ([place])   . Diabetes mellitus without complication ([place])   . GERD (gastroesophageal reflux disease)   . History of chicken pox   . Myocardial infarction [facility])    08    Past Surgical History:  Procedure Laterality Date  . heart artery stent  2008  . KNEE SURGERY    . Myocrasial Perfusion Scan  09/13/2011   Abnormal images. Not thought to be significant per Dr. Nehemiah Massed    Prior to Admission medications   Medication Sig Start Date End Date Taking? Authorizing Provider  Omega-3 Fatty Acids (FISH OIL) 1000 MG CAPS Take 1 capsule by mouth 2 (two) times daily.  12/05/08  Yes [provider]  albuterol (PROVENTIL HFA;VENTOLIN HFA) 108 (90 Base) MCG/ACT inhaler Inhale 1-2 puffs into the lungs every 6 (six) hours as needed for shortness of breath. 11/15/18   Birdie Sons, MD  clopidogrel (PLAVIX) 75 MG tablet TAKE 1 TABLET BY MOUTH EVERY DAY 10/04/18   Birdie Sons, MD  Coenzyme Q10 (COQ10) 200 MG CAPS Take 1 capsule by mouth 2 (two) times daily.    [provider]  Cyanocobalamin (VITAMIN B 12) 250 MCG LOZG Take by mouth.    [provider]  fluticasone (FLONASE) 50 MCG/ACT nasal spray Place into the nose. 08/31/13   [provider]  glucose blood test strip  02/07/13   [provider]  insulin regular human CONCENTRATED (HUMULIN R) 500 UNIT/ML injection Use up to 250 units (as calculated by U100 syringe) daily via insulin pump 02/16/17   [provider]  lisinopril (PRINIVIL,ZESTRIL) 10 MG tablet Take 1 tablet (10 mg total) by mouth daily. 01/29/15   Birdie Sons, MD  LORazepam (ATIVAN) 0.5 MG tablet Take by mouth. 1-2 every 6 hours as needed 07/13/13   [provider]  losartan (COZAAR) 25 MG tablet Take 1 tablet (25 mg total) by mouth daily. Take in place of lisinopril 02/09/19 02/09/20  Laverle Hobby, MD  metFORMIN (GLUCOPHAGE) 500 MG tablet Take 500 mg by mouth 2 (two) times daily.     [provider]  metoCLOPramide (REGLAN) 10 MG tablet Take 1 tablet (10 mg total) by mouth 4 (four) times daily as needed. 07/06/17   Birdie Sons, MD  montelukast (SINGULAIR) 10 MG tablet Take 1 tablet (10 mg total) by mouth at bedtime. 11/15/18   Birdie Sons, MD  Multiple Vitamin (MULTIVITAMIN) tablet Take 1 tablet by mouth daily.    [provider]  pantoprazole (PROTONIX) 40 MG tablet TAKE 1 TABLET BY MOUTH EVERY DAY 05/30/18   Birdie Sons, MD  rosuvastatin (CRESTOR) 40 MG tablet TAKE 1 TABLET(40 MG) BY MOUTH DAILY 09/08/18   Birdie Sons, MD  sertraline (ZOLOFT) 100 MG tablet TAKE 1 TABLET BY MOUTH ONCE DAILY 08/11/18   Birdie Sons, MD    Allergies as of 04/20/2019 - Review Complete 04/10/2019  Allergen Reaction Noted  . Aleve  [naproxen sodium]  01/29/2015  . Mobic  [meloxicam]  01/29/2015  . Voltaren  [diclofenac sodium]  01/29/2015  Family History  Problem Relation Age of Onset  . Hypertension Mother   . Diabetes Mother        insulin dependent  . Hyperlipidemia Mother   . Diabetes Other   . Hypertension Other   . Heart disease Other   . Arthritis Other     Social History   Socioeconomic History  . Marital status: Married    Spouse name: Not on file  . Number of children: Not on file  . Years of education: Not on file  . Highest education level: Not on file  Occupational History  . Occupation: Engineer, site  Social Needs  . Financial resource strain: Not on file  . Food insecurity    Worry: Not on file    Inability: Not on file  . Transportation needs    Medical: Not on  file    Non-medical: Not on file  Tobacco Use  . Smoking status: Former Smoker    Packs/day: 1.50    Years: 20.00    Pack years: 30.00    Types: Cigarettes    Quit date: 08/10/2006    Years since quitting: 12.7  . Smokeless tobacco: Never Used  Substance and Sexual Activity  . Alcohol use: No    Alcohol/week: 0.0 standard drinks  . Drug use: No  . Sexual activity: Not on file  Lifestyle  . Physical activity    Days per week: Not on file    Minutes per session: Not on file  . Stress: Not on file  Relationships  . Social Herbalist on phone: Not on file    Gets together: Not on file    Attends religious service: Not on file    Active member of club or organization: Not on file    Attends meetings of clubs or organizations: Not on file    Relationship status: Not on file  . Intimate partner violence    Fear of current or ex partner: Not on file    Emotionally abused: Not on file    Physically abused: Not on file    Forced sexual activity: Not on file  Other Topics Concern  . Not on file  Social History Narrative  . Not on file    Review of Systems: See HPI, otherwise negative ROS  Physical Exam: There were no vitals taken for this visit. General:   Alert,  pleasant and cooperative in NAD Head:  Normocephalic and atraumatic. Neck:  Supple; no masses or thyromegaly. Lungs:  Clear throughout to auscultation, normal respiratory effort.    Heart:  +S1, +S2, Regular rate and rhythm, No edema. Abdomen:  Soft, nontender and nondistended. Normal bowel sounds, without guarding, and without rebound.   Neurologic:  Alert and  oriented x4;  grossly normal neurologically.  Impression/Plan: Alex [name] is here for an endoscopy  to be performed for  evaluation of esophageal varices    Risks, benefits, limitations, and alternatives regarding endoscopy have been reviewed with the patient.  Questions have been answered.  All parties agreeable.   Jonathon Bellows, MD   05/01/2019, 8:41 AM

## 2019-05-02 ENCOUNTER — Encounter: Payer: Self-pay | Admitting: Gastroenterology

## 2019-05-02 LAB — SURGICAL PATHOLOGY

## 2019-05-04 ENCOUNTER — Other Ambulatory Visit: Payer: Self-pay

## 2019-05-04 ENCOUNTER — Ambulatory Visit (INDEPENDENT_AMBULATORY_CARE_PROVIDER_SITE_OTHER): Payer: BC Managed Care – PPO | Admitting: Gastroenterology

## 2019-05-04 DIAGNOSIS — K7460 Unspecified cirrhosis of liver: Secondary | ICD-10-CM

## 2019-05-04 DIAGNOSIS — Z23 Encounter for immunization: Secondary | ICD-10-CM

## 2019-05-05 ENCOUNTER — Encounter: Payer: Self-pay | Admitting: Gastroenterology

## 2019-05-08 DIAGNOSIS — E782 Mixed hyperlipidemia: Secondary | ICD-10-CM | POA: Diagnosis not present

## 2019-05-08 DIAGNOSIS — E1129 Type 2 diabetes mellitus with other diabetic kidney complication: Secondary | ICD-10-CM | POA: Diagnosis not present

## 2019-05-08 DIAGNOSIS — I10 Essential (primary) hypertension: Secondary | ICD-10-CM | POA: Diagnosis not present

## 2019-05-08 DIAGNOSIS — I2510 Atherosclerotic heart disease of native coronary artery without angina pectoris: Secondary | ICD-10-CM | POA: Diagnosis not present

## 2019-05-22 ENCOUNTER — Other Ambulatory Visit: Payer: Self-pay | Admitting: Family Medicine

## 2019-06-14 DIAGNOSIS — E1159 Type 2 diabetes mellitus with other circulatory complications: Secondary | ICD-10-CM | POA: Diagnosis not present

## 2019-06-14 LAB — LIPID PANEL
Cholesterol: 154 (ref 0–200)
HDL: 34 — AB (ref 35–70)
LDL Cholesterol: 83
Triglycerides: 185 — AB (ref 40–160)

## 2019-06-14 LAB — BASIC METABOLIC PANEL
BUN: 14 (ref 4–21)
CO2: 30 — AB (ref 13–22)
Chloride: 100 (ref 99–108)
Creatinine: 1 (ref 0.6–1.3)
Potassium: 4.9 (ref 3.4–5.3)
Sodium: 139 (ref 137–147)

## 2019-06-14 LAB — HEMOGLOBIN A1C: Hemoglobin A1C: 6.8

## 2019-06-14 LAB — COMPREHENSIVE METABOLIC PANEL
Albumin: 4.6 (ref 3.5–5.0)
GFR calc non Af Amer: 79

## 2019-06-14 LAB — HEPATIC FUNCTION PANEL
ALT: 32 (ref 10–40)
AST: 36 (ref 14–40)
Bilirubin, Total: 0.6

## 2019-06-14 LAB — MICROALBUMIN, URINE: Microalb, Ur: 36

## 2019-06-21 DIAGNOSIS — Z9641 Presence of insulin pump (external) (internal): Secondary | ICD-10-CM | POA: Diagnosis not present

## 2019-06-21 DIAGNOSIS — E1159 Type 2 diabetes mellitus with other circulatory complications: Secondary | ICD-10-CM | POA: Diagnosis not present

## 2019-06-21 DIAGNOSIS — I10 Essential (primary) hypertension: Secondary | ICD-10-CM | POA: Diagnosis not present

## 2019-06-23 ENCOUNTER — Other Ambulatory Visit: Payer: Self-pay | Admitting: Family Medicine

## 2019-06-26 DIAGNOSIS — E119 Type 2 diabetes mellitus without complications: Secondary | ICD-10-CM | POA: Diagnosis not present

## 2019-06-28 ENCOUNTER — Ambulatory Visit: Payer: BC Managed Care – PPO | Admitting: Pulmonary Disease

## 2019-06-28 DIAGNOSIS — E119 Type 2 diabetes mellitus without complications: Secondary | ICD-10-CM | POA: Diagnosis not present

## 2019-06-29 ENCOUNTER — Ambulatory Visit (INDEPENDENT_AMBULATORY_CARE_PROVIDER_SITE_OTHER): Payer: BC Managed Care – PPO | Admitting: Pulmonary Disease

## 2019-06-29 ENCOUNTER — Encounter: Payer: Self-pay | Admitting: Pulmonary Disease

## 2019-06-29 ENCOUNTER — Other Ambulatory Visit: Payer: Self-pay

## 2019-06-29 VITALS — BP 138/80 | HR 88 | Temp 98.2°F | Ht 70.0 in | Wt 234.8 lb

## 2019-06-29 DIAGNOSIS — J449 Chronic obstructive pulmonary disease, unspecified: Secondary | ICD-10-CM | POA: Diagnosis not present

## 2019-06-29 DIAGNOSIS — E669 Obesity, unspecified: Secondary | ICD-10-CM

## 2019-06-29 DIAGNOSIS — E8801 Alpha-1-antitrypsin deficiency: Secondary | ICD-10-CM | POA: Diagnosis not present

## 2019-06-29 MED ORDER — TRELEGY ELLIPTA 100-62.5-25 MCG/INH IN AEPB: 1.0000 | INHALATION_SPRAY | Freq: Every day | RESPIRATORY_TRACT | 11 refills | Status: DC

## 2019-06-29 NOTE — Patient Instructions (Signed)
1.  I recommend that you go to the alpha-1 foundation [email or web address]/, there is ulcers of information about alpha-1 in this website.    2.  We will see you in follow-up in 3 to 4 weeks time.  3.  We are trying you on Trelegy Ellipta 1 inhalation daily.  This replaces Dulera.  Rinse well after use.  You may use some baking soda and water after use.

## 2019-07-10 DIAGNOSIS — E119 Type 2 diabetes mellitus without complications: Secondary | ICD-10-CM | POA: Diagnosis not present

## 2019-07-26 ENCOUNTER — Ambulatory Visit (INDEPENDENT_AMBULATORY_CARE_PROVIDER_SITE_OTHER): Payer: BC Managed Care – PPO | Admitting: Pulmonary Disease

## 2019-07-26 ENCOUNTER — Ambulatory Visit: Payer: BC Managed Care – PPO | Admitting: Pulmonary Disease

## 2019-07-26 DIAGNOSIS — K7460 Unspecified cirrhosis of liver: Secondary | ICD-10-CM | POA: Diagnosis not present

## 2019-07-26 DIAGNOSIS — E8801 Alpha-1-antitrypsin deficiency: Secondary | ICD-10-CM | POA: Diagnosis not present

## 2019-07-26 NOTE — Progress Notes (Signed)
   Subjective:    Patient ID: Alex [name], male    DOB: 1966-04-10, 53 y.o.   MRN: [id number] Virtual Visit Via Video or Telephone Note:   This visit type was conducted due to national recommendations for restrictions regarding the COVID-19 pandemic .  This format is felt to be most appropriate for this patient at this time.  All issues noted in this document were discussed and addressed.  No physical exam was performed (except for noted visual exam findings with Video Visits).    I connected with Alex [name] by telephone at [place] PM and verified that I was speaking with the correct person using two identifiers. Location patient: home Location provider: [facility] Persons participating in the virtual visit: patient, patient's wife (his choice) and physician   I discussed the limitations, risks, security and privacy concerns of performing an evaluation and management service by telephone and the availability of in person appointments. The patient expressed understanding and agreed to proceed.  HPI The patient is a 53 year old former smoker previously followed by Dr. Ashby Dawes, I assumed care of this case on 29 June 2019 due to Dr. Mathis Fare departure from the practice.  During his initial visit with me the patient was still ambivalent with regards to alpha-1 antitrypsin augmentation.  He was provided the alpha-1 foundation website information so that he could investigate this further.  Today during our telephone conversation the patient wants to proceed with alpha-1 augmentation therapy.  This has been also recommended by gastroenterology due to his liver issues.  He was placed on Trelegy Ellipta previously and states that he has not noticed much improvement with the medication but continues to use it.  He does have some issues with cough that have been plaguing him.  No fevers, chills or sweats no orthopnea or paroxysmal nocturnal dyspnea.  No other symptomatology.   Review of Systems A 10 point review of systems was performed and it is as noted above otherwise negative.    Objective:   Physical Exam  No physical exam performed due to being a telephone encounter.      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

## 2019-07-26 NOTE — Progress Notes (Signed)
Subjective:    Patient ID: Alex [name], male    DOB: [birth year], 53 y.o.   MRN: [id number]  HPI Patient is a 53 year old former smoker previously followed by Dr. Ashby Dawes, I am assuming care after Dr. Mathis Fare departure.  He has a diagnosis of chronic bronchitis currently on Dulera and Singulair.  He also has been diagnosed with alpha-1 antitrypsin deficiency with low alpha-1 levels he is SZ phenotype.  He last saw Dr. Ashby Dawes on 31 August and alpha-1 augmentation was discussed however he is still considering it.  There were changes on CT chest consistent with very mild subacute hypersensitivity pneumonitis versus early ILD.  Patient presents today stating that continues to have some issues with dyspnea and fatigue feels that [facility] only helps some with the dyspnea issue.  He continues to have some cough but this has improved with the use of Dulera.  His PFTs were consistent mostly with restriction with associated air trapping restriction is likely on the basis of obesity as his ERV was 7%.  FEV1 was 2.56 L or 64% predicted.  Patient has not had any orthopnea, paroxysmal nocturnal dyspnea or lower extremity edema.  As noted he does have fatigue.  No chest pain.  Cough has been mostly productive of whitish to yellowish sputum no hemoptysis.  DIAGNOSTIC DATA: 01/06/2019 CT chest high resolution: Patchy confluent centrilobular groundglass micronodularity with upper lung predominance.  Mild to moderate patchy air trapping. 02/09/2019 alpha-1 phenotype SZ, alpha-1 level 81 mg/dL 03/20/2019 hepatitis A, B and C titers negative, HIV negative.  IgA 497 mL/dL (normal) 03/20/2019 alpha-1 level verified 75 mg/dL 04/04/2019 PFTs: FEV1 2.56 L or 64% predicted, FEV1/FVC 79%, FVC 3.23 L or 63% predicted, DLCO 69%. 04/06/2019 hepatitis A and B immunization given (first round) 04/06/2019 alpha-1 level 74 mg/dL, phenotype verified SZ 04/10/2019 influenza vaccine given 05/04/2019 hepatitis B  immunization concluded   Review of Systems A 10 point review of systems was performed and it is as noted above otherwise negative.    Objective:   Physical Exam Vitals and nursing note reviewed.  Constitutional:      General: He is not in acute distress.    Appearance: He is obese.  HENT:     Head: Normocephalic and atraumatic.     Right Ear: External ear normal.     Left Ear: External ear normal.     Nose:     Comments: Nose/mouth/throat not examined due to masking requirements for COVID 19. Eyes:     General: No scleral icterus.    Conjunctiva/sclera: Conjunctivae normal.     Pupils: Pupils are equal, round, and reactive to light.  Neck:     Thyroid: No thyromegaly.     Trachea: Trachea and phonation normal.  Cardiovascular:     Rate and Rhythm: Normal rate and regular rhythm.     Pulses: Normal pulses.     Heart sounds: Normal heart sounds.  Pulmonary:     Effort: Pulmonary effort is normal.     Breath sounds: No wheezing, rhonchi or rales.     Comments: Coarse breath sounds throughout Abdominal:     General: Abdomen is protuberant. There is no distension.  Musculoskeletal:        General: Normal range of motion.     Cervical back: Neck supple.     Right lower leg: No edema.     Left lower leg: No edema.  Lymphadenopathy:     Cervical: No cervical adenopathy.  Skin:    General:  Skin is warm and dry.  Neurological:     General: No focal deficit present.     Mental Status: He is alert and oriented to person, place, and time.  Psychiatric:        Mood and Affect: Mood normal.        Behavior: Behavior normal.        Assessment & Plan:   1.  Alpha-1 antitrypsin deficiency, phenotype SZ: Patient qualifies for alpha-1 augmentation.  He has been somewhat indecisive in this regard.  I have referred him to the alpha-1 foundation website@ww .[employer].ca so that he can educate himself with regards to this disease.  We will reconvene in 3 to 4 weeks time to discuss  potential augmentation therapy at that time.  2.  Chronic bronchitis: He is not finding good relief with Dulera alone, we will switch this to Trelegy Ellipta 1 inhalation daily.  He was instructed to rinse well after use.  He was instructed on the proper use of the dry powder inhaler.  3.  Obesity: This issue adds complexity to his management.   Renold Don, MD [facility]   This note was dictated using voice recognition software/Dragon.  Despite best efforts to proofread, errors can occur which can change the meaning.  Any change was purely unintentional.

## 2019-08-02 ENCOUNTER — Telehealth: Payer: Self-pay | Admitting: Pulmonary Disease

## 2019-08-02 NOTE — Telephone Encounter (Signed)
prolastin enrollment and prescription form has been faxed to [employer] at [phone]. Will hold in triage for f/u.

## 2019-08-14 ENCOUNTER — Telehealth: Payer: Self-pay | Admitting: Pulmonary Disease

## 2019-08-14 MED ORDER — ALBUTEROL SULFATE HFA 108 (90 BASE) MCG/ACT IN AERS: 2.0000 | INHALATION_SPRAY | Freq: Four times a day (QID) | RESPIRATORY_TRACT | 2 refills | Status: DC | PRN

## 2019-08-14 NOTE — Telephone Encounter (Signed)
Spoke to pt for update on Prolastin.  Pt stated that he received a call from [facility] and was advised that process has been started, and that they would be contacting insurance.

## 2019-08-14 NOTE — Telephone Encounter (Signed)
Called and spoke to pt, who stated that he is having trouble taking a deep breathe. When taking a deep breathe, he develops a non prod cough x1w. Denies fever, chills, sweats or additional symptoms. Pt stated that does not have rescue inhaler, however he uses Trelegy once daily.   LG please advise. Thanks

## 2019-08-14 NOTE — Telephone Encounter (Signed)
Ok to send rescue inhaler but he still needs to be checked. Shortness of breath over baseline is one of the COVFID symptoms

## 2019-08-14 NOTE — Telephone Encounter (Signed)
Needs to be evaluated at Urgent care or ED may need COVID tst

## 2019-08-14 NOTE — Telephone Encounter (Signed)
Pt is aware of below message and voiced his understanding.  Rx for Ventolin has been sent to preferred pharmacy.  Pt has been provided with contact number to schedule COVID test.  Nothing further is needed.

## 2019-08-14 NOTE — Telephone Encounter (Signed)
Called and spoke to pt and relayed below message.  Pt is questioning if an inhaler can be called in to help with sx.  Pt does not feel that this is covid related, as he does not have an other symptoms.   LG, please advise. Thanks.

## 2019-08-15 ENCOUNTER — Ambulatory Visit: Payer: BC Managed Care – PPO | Attending: Internal Medicine

## 2019-08-15 ENCOUNTER — Telehealth: Payer: Self-pay | Admitting: Pulmonary Disease

## 2019-08-15 DIAGNOSIS — Z20822 Contact with and (suspected) exposure to covid-19: Secondary | ICD-10-CM

## 2019-08-15 NOTE — Telephone Encounter (Signed)
A appeal is needed per denial letter.  You can contact a clinical reviewer at [phone] to do so.

## 2019-08-15 NOTE — Telephone Encounter (Signed)
Received denial letter from [facility] for prolastin.  Letter has been placed to Dr. Domingo Dimes folder for review.

## 2019-08-15 NOTE — Telephone Encounter (Signed)
Pt is aware of albuterol is to be used Q6H as needed and trelegy is once daily.  Pt voiced his understanding and had no further questions. Nothing further is needed.

## 2019-08-15 NOTE — Telephone Encounter (Signed)
Noted. Will need to send pre auth.

## 2019-08-17 LAB — NOVEL CORONAVIRUS, NAA: SARS-CoV-2, NAA: NOT DETECTED

## 2019-08-22 ENCOUNTER — Telehealth: Payer: Self-pay | Admitting: Pulmonary Disease

## 2019-08-22 NOTE — Telephone Encounter (Signed)
Discussed with patient we will see him tomorrow at 11:00 in the morning.

## 2019-08-22 NOTE — Telephone Encounter (Signed)
Noted  

## 2019-08-22 NOTE — Telephone Encounter (Signed)
Per LG verbally- recommend ED, as Alex [name] will need CXR or CT.  Spoke to Alex [name] and relayed above message. Alex [name] was not agreeable with this plan.  I explained to Alex [name] with his sx and recent exposure that it is possible that he does in fact have covid with a false negative test.  Alex [name] stated that his current sx were present when he was tested for covid. He has stopped trelegy and restarted Dulera with relief in breathing. Alex [name] stated that he would contact Dr. Caryn Section and request that he order CXR,.     Will route to Dr. Patsey Berthold to make aware.

## 2019-08-23 ENCOUNTER — Ambulatory Visit (INDEPENDENT_AMBULATORY_CARE_PROVIDER_SITE_OTHER): Payer: BC Managed Care – PPO | Admitting: Pulmonary Disease

## 2019-08-23 ENCOUNTER — Encounter: Payer: Self-pay | Admitting: Pulmonary Disease

## 2019-08-23 ENCOUNTER — Ambulatory Visit
Admission: RE | Admit: 2019-08-23 | Discharge: 2019-08-23 | Disposition: A | Discharge status: Home / Self Care | Payer: BC Managed Care – PPO | Source: Ambulatory Visit | Attending: Pulmonary Disease | Admitting: Pulmonary Disease

## 2019-08-23 ENCOUNTER — Other Ambulatory Visit
Admission: RE | Admit: 2019-08-23 | Discharge: 2019-08-23 | Disposition: A | Discharge status: Home / Self Care | Payer: BC Managed Care – PPO | Source: Ambulatory Visit | Attending: Pulmonary Disease | Admitting: Pulmonary Disease

## 2019-08-23 VITALS — BP 142/78 | HR 85 | Temp 97.3°F | Ht 70.0 in | Wt 235.6 lb

## 2019-08-23 DIAGNOSIS — E8801 Alpha-1-antitrypsin deficiency: Secondary | ICD-10-CM

## 2019-08-23 DIAGNOSIS — R059 Cough, unspecified: Secondary | ICD-10-CM

## 2019-08-23 DIAGNOSIS — J449 Chronic obstructive pulmonary disease, unspecified: Secondary | ICD-10-CM

## 2019-08-23 DIAGNOSIS — R05 Cough: Secondary | ICD-10-CM

## 2019-08-23 DIAGNOSIS — K7469 Other cirrhosis of liver: Secondary | ICD-10-CM

## 2019-08-23 DIAGNOSIS — R0602 Shortness of breath: Secondary | ICD-10-CM | POA: Diagnosis not present

## 2019-08-23 LAB — CBC WITH DIFFERENTIAL/PLATELET
Abs Immature Granulocytes: 0.02 10*3/uL (ref 0.00–0.07)
Basophils Absolute: 0.1 10*3/uL (ref 0.0–0.1)
Basophils Relative: 1 %
Eosinophils Absolute: 0.2 10*3/uL (ref 0.0–0.5)
Eosinophils Relative: 2 %
HCT: 43.2 % (ref 39.0–52.0)
Hemoglobin: 14.6 g/dL (ref 13.0–17.0)
Immature Granulocytes: 0 %
Lymphocytes Relative: 21 %
Lymphs Abs: 1.7 10*3/uL (ref 0.7–4.0)
MCH: 29.3 pg (ref 26.0–34.0)
MCHC: 33.8 g/dL (ref 30.0–36.0)
MCV: 86.7 fL (ref 80.0–100.0)
Monocytes Absolute: 0.6 10*3/uL (ref 0.1–1.0)
Monocytes Relative: 7 %
Neutro Abs: 5.7 10*3/uL (ref 1.7–7.7)
Neutrophils Relative %: 69 %
Platelets: 245 10*3/uL (ref 150–400)
RBC: 4.98 MIL/uL (ref 4.22–5.81)
RDW: 13.5 % (ref 11.5–15.5)
WBC: 8.3 10*3/uL (ref 4.0–10.5)
nRBC: 0 % (ref 0.0–0.2)

## 2019-08-23 MED ORDER — PREDNISONE 10 MG (21) PO TBPK: ORAL_TABLET | ORAL | 0 refills | Status: DC

## 2019-08-23 MED ORDER — CLARITHROMYCIN 500 MG PO TABS: 500.0000 mg | ORAL_TABLET | Freq: Two times a day (BID) | ORAL | 0 refills | Status: AC

## 2019-08-23 MED ORDER — LOSARTAN POTASSIUM 25 MG PO TABS: 25.0000 mg | ORAL_TABLET | Freq: Every day | ORAL | 11 refills | Status: DC

## 2019-08-23 MED ORDER — BUDESONIDE-FORMOTEROL FUMARATE 160-4.5 MCG/ACT IN AERO: 2.0000 | INHALATION_SPRAY | Freq: Two times a day (BID) | RESPIRATORY_TRACT | 12 refills | Status: DC

## 2019-08-23 NOTE — Progress Notes (Signed)
Subjective:    Patient ID: Alex [name], male    DOB: [birth year], 54 y.o.   MRN: [id number]  HPI Patient is 54 year old former smoker (quit 2008) he is on SZ phenotype with documented alpha-1 deficiency by low levels.  He has persistent issues with cough and shortness of breath on exertion.This has been a chronic issue for him he has been evaluated by gastroenterology due to cirrhosis and transaminitis.  It is gastroenterology's opinion that he does need alpha-1 augmentation.  The patient is now willing to undergo this.  We have been trying to procure the medication however it has been declined by insurance.  We continue to advocate for preauthorization.  During his last visit we placed him on Trelegy Ellipta this actually did not do well for the patient he felt that his cough got worse on this medication.  Previously he had been taken off of lisinopril and placed on Cozaar however for reasons unbeknownst to me he has started back on the lisinopril.  As noted his cough is worse.  CT scan had changes consistent with potential hypersensitivity pneumonitis and this will need to be delved into.  My personal interpretation of the findings is that there is some early fibrosis in the patient does have also significant air trapping on the high-resolution CT indicating airways disease.  Pulmonary function testing performed in August 2020 as previously noted is consistent with combined obstructive and this restrictive disease the restriction due to obesity his ERV being 7%.  Because of the concomitant restriction the obstruction is underestimated.  Patient has had no fevers, chills or sweats.  He has had some chest tightness and discomfort.  He is currently not on any bronchodilators as he had to discontinue Trelegy.  He has not had any other symptoms since his last visit.  Continues with debilitating dyspnea with exertion. DIAGNOSTIC DATA: 01/06/2019 CT chest high resolution: Patchy confluent centrilobular  groundglass micronodularity with upper lung predominance.  Mild to moderate patchy air trapping. 02/09/2019 alpha-1 phenotype SZ, alpha-1 level 81 mg/dL 03/20/2019 hepatitis A, B and C titers negative, HIV negative.  IgA 497 mL/dL (normal) 03/20/2019 alpha-1 level verified 75 mg/dL 04/04/2019 PFTs: FEV1 2.56 L or 64% predicted, FEV1/FVC 79%, FVC 3.23 L or 63% predicted, DLCO 69%. 04/06/2019 hepatitis A and B immunization given (first round) 04/06/2019 alpha-1 level 74 mg/dL, phenotype verified SZ 04/10/2019 influenza vaccine given 05/04/2019 hepatitis B immunization concluded  Review of Systems A 10 point review of systems was performed and it is as noted above otherwise negative.    Objective:   Physical Exam BP (!) 142/78 (BP Location: Left Arm, Patient Position: Sitting, Cuff Size: Large)   Pulse 85   Temp (!) 97.3 F (36.3 C) (Temporal)   Ht 5\' 10"  (1.778 m)   Wt 235 lb 9.6 oz (106.9 kg)   SpO2 95% Comment: on ra  BMI 33.81 kg/m  GENERAL: Obese gentleman, anxious, no acute distress otherwise. HEAD: Normocephalic, atraumatic.  EYES: Pupils equal, round, reactive to light.  No scleral icterus.  MOUTH: Nose/mouth/throat not examined due to masking requirements for COVID 19. NECK: Supple. No thyromegaly. No nodules. No JVD.  Trachea midline, phonation normal. PULMONARY: Lungs with coarse breath sounds throughout.  No wheezing. CARDIOVASCULAR: S1 and S2. Regular rate and rhythm.  No murmurs noted. GASTROINTESTINAL: Protuberant abdomen, soft MUSCULOSKELETAL: No joint swelling, no clubbing, no edema.  NEUROLOGIC: Awake alert, no focal deficits. SKIN: Intact,warm,dry.  Seborrheic dermatitis on the forehead. PSYCH: Anxious, behavior appropriate.  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   Renold Don, MD [facility]  *This note was dictated using voice recognition software/Dragon.  Despite best efforts to proofread, errors can occur which can change the meaning.  Any change was purely unintentional.

## 2019-08-23 NOTE — Patient Instructions (Addendum)
1.  Recurrent check a chest x-ray today.  2.  We are going to check some blood work  3.  STOP lisinopril  4.  We will start you back on losartan (Cozaar) 25 mg daily for blood pressure control  5.  If you run into issues getting the Symbicort let us know we will have to run another prior authorization.

## 2019-08-25 LAB — IGE: IgE (Immunoglobulin E), Serum: 8 IU/mL (ref 6–495)

## 2019-08-28 LAB — HYPERSENSITIVITY PNEUMONITIS
A. Pullulans Abs: NEGATIVE
A.Fumigatus #1 Abs: NEGATIVE
Micropolyspora faeni, IgG: NEGATIVE
Pigeon Serum Abs: NEGATIVE
Thermoact. Saccharii: NEGATIVE
Thermoactinomyces vulgaris, IgG: NEGATIVE

## 2019-09-08 NOTE — Telephone Encounter (Signed)
Routing to Dr. Patsey Berthold for f/u.

## 2019-09-19 ENCOUNTER — Ambulatory Visit (INDEPENDENT_AMBULATORY_CARE_PROVIDER_SITE_OTHER): Payer: BC Managed Care – PPO | Admitting: Pulmonary Disease

## 2019-09-19 ENCOUNTER — Other Ambulatory Visit: Payer: Self-pay

## 2019-09-19 ENCOUNTER — Encounter: Payer: Self-pay | Admitting: Pulmonary Disease

## 2019-09-19 VITALS — BP 142/78 | HR 98 | Temp 97.6°F | Ht 70.0 in | Wt 237.0 lb

## 2019-09-19 DIAGNOSIS — K7469 Other cirrhosis of liver: Secondary | ICD-10-CM

## 2019-09-19 DIAGNOSIS — J449 Chronic obstructive pulmonary disease, unspecified: Secondary | ICD-10-CM

## 2019-09-19 DIAGNOSIS — E8801 Alpha-1-antitrypsin deficiency: Secondary | ICD-10-CM

## 2019-09-19 MED ORDER — SPIRIVA RESPIMAT 2.5 MCG/ACT IN AERS: 2.0000 | INHALATION_SPRAY | Freq: Every day | RESPIRATORY_TRACT | 0 refills | Status: DC

## 2019-09-19 NOTE — Progress Notes (Signed)
Subjective:    Patient ID: Alex [name], male    DOB: 1966/06/10, 54 y.o.   MRN: [id number]  HPI Alex [name] is a 54 year old former smoker (quit 2008) with alpha-1 antitrypsin deficiency/SZ phenotype and associated cirrhosis of the liver.  The patient presents today for follow-up.  During his last visit he was taken off lisinopril and switch to Cozaar this has improved his cough somewhat.  As previously noted he ultimately was not able to tolerate Trelegy Ellipta and was switched to Symbicort 160/4.5.  In the interim he also received a prednisone taper as he had difficulties following the last visit also received an antibiotic.  He continues to have monthly exacerbations.  His PFTs are deceiving and that he does have a combination of obstruction as well as restriction which "cancels out" the obstructive component.  Restriction is likely on the basis of obesity.  He notes that Symbicort is somewhat helpful however does not control his symptoms fully.  He needs alpha-1 augmentation therapy however,this has been denied by his insurance company we are in the process of appeal.  He has not had any fevers chills or sweats recently.  He has daily sputum production which is chronically discolored grayish-brown.  No hemoptysis.  He gets occasional chest discomfort particularly when he has bronchospasm.  DIAGNOSTIC DATA: 05/29/2020CT chest high resolution: Patchy confluent centrilobular groundglass micronodularity with upper lung predominance. Mild to moderate patchy air trapping. 07/02/2020alpha-1 phenotype SZ, alpha-1 level 81 mg/dL 08/10/2020hepatitis A,B and C titers negative, HIV negative. IgA 497 mL/dL (normal) 08/10/2020alpha-1 level verified 75 mg/dL 08/25/2020PFTs: FEV1 2.56 L or 64% predicted, FEV1/FVC 79%, FVC 3.23 Lor 63% predicted, DLCO 69%. 08/27/2020hepatitis A and B immunization given (first round) 08/27/2020alpha-1 level 74 mg/dL, phenotype verified SZ 08/31/2020influenza vaccine  given 09/24/2020hepatitis B immunization concluded 08/23/2019 IgE level, hypersensitivity pneumonitis panel and CBC all normal  Review of Systems A 10 point review of systems was performed and it is as noted above otherwise negative.    Objective:   Physical Exam BP (!) 142/78 (BP Location: Left Arm, Patient Position: Sitting, Cuff Size: Large)   Pulse 98   Temp 97.6 F (36.4 C) (Temporal)   Ht 5\' 10"  (1.778 m)   Wt 237 lb (107.5 kg)   SpO2 97% Comment: on ra  BMI 34.01 kg/m   GENERAL: Obese gentleman, anxious, no acute distress otherwise. HEAD: Normocephalic, atraumatic.  EYES: Pupils equal, round, reactive to light.  No scleral icterus.  MOUTH: Nose/mouth/throat not examined due to masking requirements for COVID 19. NECK: Supple. No thyromegaly. No nodules. No JVD.  Trachea midline, phonation normal. PULMONARY: Lungs with coarse breath sounds throughout.  No wheezing. CARDIOVASCULAR: S1 and S2. Regular rate and rhythm.  No murmurs noted. GASTROINTESTINAL: Protuberant abdomen, soft MUSCULOSKELETAL: No joint swelling, no clubbing, no edema.  NEUROLOGIC: Awake alert, no focal deficits. SKIN: Intact,warm,dry.  Seborrheic dermatitis on the forehead. PSYCH: Anxious, behavior appropriate.      Assessment & Plan:  Alpha-1 antitrypsin deficiency, phenotype SZ COPD with chronic asthmatic bronchitis and emphysema  Recommend alpha-1 antitrypsin deficiency patient's monthly exacerbations likely due to ongoing destruction by elastase released by neutrophils in the lung  Only specific treatment for this is intravenous infusion of purified AAT  Infusion of AAT inhibits the destructive action of proteases in the lung  Other medications only temporizing  Continue Symbicort 160/4.5, 2 inhalations twice a day  Add Spiriva 2.5 mcg, 2 inhalations daily, samples provided for the patient   Cirrhosis of the liver  in the setting of alpha-1 Gastroenterology recommends AAT  infusion   Appeal to insurance company for alpha-1 augmentation therapy.  Follow-up in 3 to 4 weeks time or sooner should any new difficulties arise.   Renold Don, MD [facility]   *This note was dictated using voice recognition software/Dragon.  Despite best efforts to proofread, errors can occur which can change the meaning.  Any change was purely unintentional.

## 2019-09-19 NOTE — Progress Notes (Signed)
   Subjective:    Patient ID: Alex [name], male    DOB: 1965/10/20, 54 y.o.   MRN: [id number]  HPI    Review of Systems     Objective:   Physical Exam        Assessment & Plan:

## 2019-09-19 NOTE — Patient Instructions (Signed)
We will start Spiriva 2.5 mcg 2 puffs daily  Let us know how this is doing for you.  We will call it and if it helps give it at least a week's time.  Continue using Symbicort and as needed albuterol.  We will see you in follow-up in 3 to 4 weeks time.

## 2019-09-20 ENCOUNTER — Other Ambulatory Visit: Payer: Self-pay | Admitting: Family Medicine

## 2019-09-20 NOTE — Telephone Encounter (Signed)
Requested medication (s) are due for refill today: yes  Requested medication (s) are on the active medication list: yes  Last refill:  06/23/2019  Future visit scheduled: no  Notes to clinic:  overdue for labs    Requested Prescriptions  Pending Prescriptions Disp Refills   rosuvastatin (CRESTOR) 40 MG tablet [Pharmacy Med Name: ROSUVASTATIN 40MG  TABLETS] 90 tablet 4    Sig: TAKE 1 TABLET(40 MG) BY MOUTH DAILY      Cardiovascular:  Antilipid - Statins Failed - 09/20/2019  3:29 AM      Failed - Total Cholesterol in normal range and within 360 days    Cholesterol  Date Value Ref Range Status  09/17/2014 176 0 - 200 mg/dL Final          Failed - LDL in normal range and within 360 days    LDL Cholesterol  Date Value Ref Range Status  09/17/2014 111 mg/dL Final          Failed - HDL in normal range and within 360 days    HDL  Date Value Ref Range Status  09/17/2014 35 35 - 70 mg/dL Final          Failed - Triglycerides in normal range and within 360 days    Triglycerides  Date Value Ref Range Status  09/17/2014 149 40 - 160 mg/dL Final          Passed - Patient is not pregnant      Passed - Valid encounter within last 12 months    Recent Outpatient Visits           11 months ago Alex [name], Alex E, MD   1 year ago Need for shingles vaccine   [facility] Birdie Sons, MD   1 year ago Pelvic pain   [facility] Birdie Sons, MD   1 year ago Need for influenza vaccination   [facility] Birdie Sons, MD   2 years ago Essential (primary) hypertension   [place], Kirstie Peri, MD       Future Appointments             In 3 weeks Tyler Pita, MD [place]

## 2019-09-20 NOTE — Telephone Encounter (Signed)
LOV 3/20 for an acute issue, CPE in 2019, last Lipids 2018

## 2019-09-22 ENCOUNTER — Encounter: Payer: Self-pay | Admitting: Pulmonary Disease

## 2019-09-26 NOTE — Telephone Encounter (Signed)
I have left a message requesting that a clinical reviewer contact Dr. Patsey Berthold for peer to peer.

## 2019-09-26 NOTE — Telephone Encounter (Signed)
Spoke to [place] with [employer] who stated that a peer to peer is needed.  Peer to peer can be started by calling [phone].  Will route to Dr. Patsey Berthold to make aware.

## 2019-09-27 NOTE — Telephone Encounter (Signed)
Received voicemail from [employer], who stated that he was calling for update on appeal.  I have left a message requesting that Nicole Kindred return our call.

## 2019-09-29 ENCOUNTER — Other Ambulatory Visit: Payer: Self-pay

## 2019-09-29 ENCOUNTER — Other Ambulatory Visit: Payer: Self-pay | Admitting: Family Medicine

## 2019-09-29 DIAGNOSIS — F410 Panic disorder [episodic paroxysmal anxiety] without agoraphobia: Secondary | ICD-10-CM

## 2019-09-29 NOTE — Telephone Encounter (Signed)
Notes to clinic:  This Rx has expired.    Requested Prescriptions  Pending Prescriptions Disp Refills   sertraline (ZOLOFT) 100 MG tablet [Pharmacy Med Name: SERTRALINE 100MG  TABLETS] 90 tablet 4    Sig: TAKE 1 TABLET BY MOUTH EVERY DAY      Psychiatry:  Antidepressants - SSRI Failed - 09/29/2019  3:30 AM      Failed - Valid encounter within last 6 months    Recent Outpatient Visits           11 months ago [place], Donald E, MD   1 year ago Need for shingles vaccine   [facility] Birdie Sons, MD   1 year ago Pelvic pain   [facility] Birdie Sons, MD   1 year ago Need for influenza vaccination   [facility] Birdie Sons, MD   2 years ago Essential (primary) hypertension   [facility] Birdie Sons, MD       Future Appointments             In 2 weeks Tyler Pita, MD [place]

## 2019-10-02 MED ORDER — SPIRIVA RESPIMAT 2.5 MCG/ACT IN AERS: 2.0000 | INHALATION_SPRAY | Freq: Every day | RESPIRATORY_TRACT | 0 refills | Status: DC

## 2019-10-03 ENCOUNTER — Other Ambulatory Visit: Payer: Self-pay

## 2019-10-03 MED ORDER — SPIRIVA RESPIMAT 2.5 MCG/ACT IN AERS: 2.0000 | INHALATION_SPRAY | Freq: Every day | RESPIRATORY_TRACT | 2 refills | Status: DC

## 2019-10-03 NOTE — Telephone Encounter (Signed)
Received voicemail from [place] with [employer]. I have returned Alex [name]'s call, who is requesting an update on appeal.  I have made Alex [name] aware that I contacted [employer] and requested that a clinical reviewer contact our office to start appeal. At this time a clinical reviewer has not contacted our office to do so. This message was left with [employer] on 09/26/2019. I have attempted to contact [employer] to leave a follow up message and the recording stated to only leave one message.

## 2019-10-06 NOTE — Telephone Encounter (Signed)
Have left voicemail for peer to peer.

## 2019-10-12 NOTE — Telephone Encounter (Signed)
Spoke to [place] with [employer], who stated that he received a call from [employer] regarding appeal. Per Margretta Ditty stated that peer to peer option is not available as we are past the deadline.  At this time an appeal letter can be written. Nicole Kindred stated that his appeal team can handle this process.  Nicole Kindred will call back with update.  Tony's contact number is [phone].

## 2019-10-13 ENCOUNTER — Encounter: Payer: Self-pay | Admitting: Family Medicine

## 2019-10-13 ENCOUNTER — Other Ambulatory Visit: Payer: Self-pay

## 2019-10-13 ENCOUNTER — Ambulatory Visit (INDEPENDENT_AMBULATORY_CARE_PROVIDER_SITE_OTHER): Payer: BC Managed Care – PPO | Admitting: Family Medicine

## 2019-10-13 VITALS — BP 144/86 | HR 82 | Temp 96.2°F | Wt 238.0 lb

## 2019-10-13 DIAGNOSIS — Z794 Long term (current) use of insulin: Secondary | ICD-10-CM

## 2019-10-13 DIAGNOSIS — F410 Panic disorder [episodic paroxysmal anxiety] without agoraphobia: Secondary | ICD-10-CM | POA: Diagnosis not present

## 2019-10-13 DIAGNOSIS — N182 Chronic kidney disease, stage 2 (mild): Secondary | ICD-10-CM

## 2019-10-13 DIAGNOSIS — I10 Essential (primary) hypertension: Secondary | ICD-10-CM

## 2019-10-13 DIAGNOSIS — E1122 Type 2 diabetes mellitus with diabetic chronic kidney disease: Secondary | ICD-10-CM | POA: Diagnosis not present

## 2019-10-13 MED ORDER — LOSARTAN POTASSIUM 50 MG PO TABS: 50.0000 mg | ORAL_TABLET | Freq: Every day | ORAL | 3 refills | Status: DC

## 2019-10-13 MED ORDER — SERTRALINE HCL 100 MG PO TABS: 50.0000 mg | ORAL_TABLET | Freq: Every day | ORAL | 1 refills | Status: DC

## 2019-10-13 NOTE — Patient Instructions (Addendum)
.   Please review the attached list of medications and notify my office if there are any errors.   . Please bring all of your medications to every appointment so we can make sure that our medication list is the same as yours.    Colon cancer screening should be covered without any copay. Please check with your insurance to see if Cologuard is now covered. Send me a Pharmacist, community message for

## 2019-10-13 NOTE — Progress Notes (Signed)
Patient: Alex [name] Male    DOB: Jul 17, 1966   54 y.o.   MRN: [id number] Visit Date: 10/13/2019  Today's Provider: Lelon Huh, MD   Chief Complaint  Patient presents with  . Diabetes  . Hyperlipidemia  . Hypertension   Subjective:     HPI  Diabetes Mellitus Type II, Follow-up:  Lab Results  Component Value Date   HGBA1C 6.8 06/14/2019    Last seen for diabetes more than 1 year ago Management since then includes no changes. This problem is managed by Dr. Gabriel Carina.   Lab Results  Component Value Date   CHOL 154 06/14/2019   HDL 34 (A) 06/14/2019   LDLCALC 83 06/14/2019   TRIG 185 (A) 06/14/2019       Wt Readings from Last 3 Encounters:  04/20/18 236 lb (107 kg)  07/05/17 243 lb (110.2 kg)  08/11/16 236 lb (107 kg)    ------------------------------------------------------------------------  Lipid/Cholesterol, Follow-up:   Last seen for this more than 1 year ago.  Management changes since that visit include none. . Lipid Panel     Component Value Date/Time   CHOL 154 06/14/2019 0000   TRIG 185 (A) 06/14/2019 0000   HDL 34 (A) 06/14/2019 0000   LDLCALC 83 06/14/2019 0000    Risk factors for vascular disease include diabetes mellitus and hypercholesterolemia  He reports good compliance with treatment. He is not having side effects.  Current symptoms include none and have been stable. Weight trend: fluctuating a bit Prior visit with dietician: no Current diet: well balanced Current exercise: none     Wt Readings from Last 3 Encounters:  04/20/18 236 lb (107 kg)  07/05/17 243 lb (110.2 kg)  08/11/16 236 lb (107 kg)    -------------------------------------------------------------------   Hypertension, follow-up:     BP Readings from Last 3 Encounters:  04/20/18 134/82  07/05/17 120/80  08/11/16 136/84    He was last seen for hypertension more than 1 year ago.  BP at that visit was 134/82. Since then lisinopril was changed  to losartan due to chronic cough, but he has also been diagnosed with alpha 1 antitrypsin deficiency which is likely cause of his cough.  He reports good compliance with treatment. He is not having side effects.  He is not exercising. He is adherent to low salt diet.   Outside blood pressures are not being checked. He is experiencing none.  Patient denies chest pain, chest pressure/discomfort, claudication, dyspnea, exertional chest pressure/discomfort, fatigue, irregular heart beat, lower extremity edema, near-syncope, orthopnea, palpitations, paroxysmal nocturnal dyspnea, syncope and tachypnea.   Cardiovascular risk factors include diabetes mellitus, dyslipidemia, hypertension and male gender.  Use of agents associated with hypertension: NSAIDS.                Weight trend: fluctuating a bit    Wt Readings from Last 3 Encounters:  04/20/18 236 lb (107 kg)  07/05/17 243 lb (110.2 kg)  08/11/16 236 lb (107 kg)    Current diet: well balanced   Complains of crick in his right neck and shoulder for the last week.   He also needs follow up anxiety on current dose of sertraline for several years which he feels he is doing well with. Had been in good mood, no anxiety or depression lately.   Allergies  Allergen Reactions  . Aleve  [Naproxen Sodium]   . Mobic  [Meloxicam]   . Voltaren  [Diclofenac Sodium]      Current  Outpatient Medications:  .  budesonide-formoterol (SYMBICORT) 160-4.5 MCG/ACT inhaler, Inhale 2 puffs into the lungs 2 (two) times daily., Disp: 1 Inhaler, Rfl: 12 .  clopidogrel (PLAVIX) 75 MG tablet, TAKE 1 TABLET BY MOUTH EVERY DAY, Disp: 90 tablet, Rfl: 4 .  Coenzyme Q10 (COQ10) 200 MG CAPS, Take 1 capsule by mouth 2 (two) times daily., Disp: , Rfl:  .  Cyanocobalamin (VITAMIN B 12) 250 MCG LOZG, Take by mouth., Disp: , Rfl:  .  Dextromethorphan HBr (BUCKLEYS COUGH PO), Take by mouth., Disp: , Rfl:  .  fluticasone (FLONASE) 50 MCG/ACT nasal spray, Place into the  nose., Disp: , Rfl:  .  glucose blood test strip, , Disp: , Rfl:  .  insulin regular human CONCENTRATED (HUMULIN R) 500 UNIT/ML injection, Use up to 250 units (as calculated by U100 syringe) daily via insulin pump, Disp: , Rfl:  .  LORazepam (ATIVAN) 0.5 MG tablet, Take by mouth. 1-2 every 6 hours as needed, Disp: , Rfl:  .  losartan (COZAAR) 25 MG tablet, Take 1 tablet (25 mg total) by mouth daily. Take in place of lisinopril, Disp: 30 tablet, Rfl: 11 .  metFORMIN (GLUCOPHAGE) 500 MG tablet, Take 500 mg by mouth 2 (two) times daily. , Disp: , Rfl:  .  metoCLOPramide (REGLAN) 10 MG tablet, Take 1 tablet (10 mg total) by mouth 4 (four) times daily as needed., Disp: 30 tablet, Rfl: 5 .  montelukast (SINGULAIR) 10 MG tablet, TAKE 1 TABLET(10 MG) BY MOUTH AT BEDTIME, Disp: 30 tablet, Rfl: 5 .  Multiple Vitamin (MULTIVITAMIN) tablet, Take 1 tablet by mouth daily., Disp: , Rfl:  .  Omega-3 Fatty Acids (FISH OIL) 1000 MG CAPS, Take 1 capsule by mouth 2 (two) times daily. , Disp: , Rfl:  .  pantoprazole (PROTONIX) 40 MG tablet, TAKE 1 TABLET BY MOUTH EVERY DAY, Disp: 90 tablet, Rfl: 4 .  rosuvastatin (CRESTOR) 40 MG tablet, TAKE 1 TABLET(40 MG) BY MOUTH DAILY, Disp: 90 tablet, Rfl: 4 .  sertraline (ZOLOFT) 100 MG tablet, TAKE 1 TABLET BY MOUTH EVERY DAY, Disp: 30 tablet, Rfl: 0 .  Tiotropium Bromide Monohydrate (SPIRIVA RESPIMAT) 2.5 MCG/ACT AERS, Inhale 2 puffs into the lungs daily., Disp: 4 g, Rfl: 2  Review of Systems  Constitutional: Negative.   Respiratory: Negative.   Cardiovascular: Negative.   Endocrine: Negative.   Musculoskeletal: Negative.     Social History   Tobacco Use  . Smoking status: Former Smoker    Packs/day: 1.50    Years: 20.00    Pack years: 30.00    Types: Cigarettes    Quit date: 08/10/2006    Years since quitting: 13.1  . Smokeless tobacco: Never Used  Substance Use Topics  . Alcohol use: No    Alcohol/week: 0.0 standard drinks      Objective:   BP (!) 144/86  (BP Location: Right Arm, Patient Position: Sitting, Cuff Size: Large)   Pulse 82   Temp (!) 96.2 F (35.7 C) (Temporal)   Wt 238 lb (108 kg)   BMI 34.15 kg/m     Physical Exam   General: Appearance:    Obese male in no acute distress  Eyes:    PERRL, conjunctiva/corneas clear, EOM's intact       Lungs:     Clear to auscultation bilaterally, respirations unlabored  Heart:    Normal heart rate. Normal rhythm. No murmurs, rubs, or gallops.   MS:   All extremities are intact.   Neurologic:  Awake, alert, oriented x 3. No apparent focal neurological           defect.          Assessment & Plan    1. Essential (primary) hypertension No low dose losartan in place of lisinopril due to chronic cough, which was probably due to alpha 1 antitrypsin deficiency. BP not at goal, will double losartan to - losartan (COZAAR) 50 MG tablet; Take 1 tablet (50 mg total) by mouth daily. PLEASE NOTE CHANGE IN DOSE FROM 25 TO 50MG   Dispense: 30 tablet; Refill: 3  2. Type 2 diabetes mellitus with stage 2 chronic kidney disease, with long-term current use of insulin (HCC) Well controlled.  Continue routing follow up Dr. Gabriel Carina  3. Panic disorder On 100mg  sertraline for several year and doing well. He can see how he does with 1/2 tablet daily, but advised he can stay on full if his has any trouble with lower dose.  - sertraline (ZOLOFT) 100 MG tablet; Take 0.5-1 tablets (50-100 mg total) by mouth daily.  Dispense: 90 tablet; Refill: 1  Follow up for CPE and recheck BP in 3-6 months.     The entirety of the information documented in the History of Present Illness, Review of Systems and Physical Exam were personally obtained by me. Portions of this information were initially documented by Elonda Husky, CMA and reviewed by me for thoroughness and accuracy.    Lelon Huh, MD  [facility]

## 2019-10-17 ENCOUNTER — Ambulatory Visit (INDEPENDENT_AMBULATORY_CARE_PROVIDER_SITE_OTHER): Payer: BC Managed Care – PPO | Admitting: Pulmonary Disease

## 2019-10-17 ENCOUNTER — Encounter: Payer: Self-pay | Admitting: Pulmonary Disease

## 2019-10-17 DIAGNOSIS — K7469 Other cirrhosis of liver: Secondary | ICD-10-CM

## 2019-10-17 DIAGNOSIS — E8801 Alpha-1-antitrypsin deficiency: Secondary | ICD-10-CM

## 2019-10-17 MED ORDER — SPIRIVA RESPIMAT 2.5 MCG/ACT IN AERS: 2.0000 | INHALATION_SPRAY | Freq: Every day | RESPIRATORY_TRACT | 11 refills | Status: DC

## 2019-10-17 NOTE — Telephone Encounter (Signed)
Left message for Alex [name] requesting update on appeal.

## 2019-10-17 NOTE — Progress Notes (Signed)
Subjective:    Patient ID: Alex [name], male    DOB: 1966-05-08, 54 y.o.   MRN: [id number] Virtual Visit Via Video or Telephone Note:   This visit type was conducted due to national recommendations for restrictions regarding the COVID-19 pandemic .  This format is felt to be most appropriate for this patient at this time.  All issues noted in this document were discussed and addressed.  No physical exam was performed (except for noted visual exam findings with Video Visits).    I connected with Valerie Roys by a video enabled telemedicine application or telephone at 3:45 PM and verified that I was speaking with the correct person using two identifiers. Location patient: Work Location provider: Immunologist Persons participating in the virtual visit: patient, physician   I discussed the limitations, risks, security and privacy concerns of performing an evaluation and management service by phone and the availability of in person appointments. The patient expressed understanding and agreed to proceed.  HPI Patient is a 54 year old former smoker (quit 2008) with alpha-1 antitrypsin deficiency/SZ phenotype and associated cirrhosis of the liver.  We are connecting today as a follow-up from his visit 19 September 2019.  At that time the patient has had a prolonged exacerbation.  These are becoming frequent almost every month.  The patient had difficulties previously with Trelegy Ellipta and has been switched to Symbicort 160/4.5, 2 inhalations twice a day in addition during his last visit we added 3.2.5 micrograms, 2 inhalations daily.  Patient has been adhering to this regimen religiously.  He feels that this has improved his cough somewhat.  We discussed the status of the appeal for his Prolastin augmentation therapy which is ultimately what he needs.  Since his last visit he has noted some improvement in his symptoms though he continues to have issues with dyspnea.  Sputum production has  decreased somewhat it continues to be discolored grayish to brown.  He has had no hemoptysis.  Chest discomfort since starting on Spiriva.  He voices no other complaint  Review of Systems A 10 point review of systems was performed and it is as noted above otherwise negative.  DIAGNOSTIC DATA: 05/29/2020CT chest high resolution: Patchy confluent centrilobular groundglass micronodularity with upper lung predominance. Mild to moderate patchy air trapping. 07/02/2020alpha-1 phenotype SZ, alpha-1 level 81 mg/dL 08/10/2020hepatitis A,B and C titers negative, HIV negative. IgA 497 mL/dL (normal) 08/10/2020alpha-1 level verified 75 mg/dL 08/25/2020PFTs: FEV1 2.56 L or 64% predicted, FEV1/FVC 79%, FVC 3.23 Lor 63% predicted, DLCO 69%. 08/27/2020hepatitis A and B immunization given (first round) 08/27/2020alpha-1 level 74 mg/dL, phenotype verified SZ 08/31/2020influenza vaccine given 09/24/2020hepatitis B immunization concluded 08/23/2019 IgE level, hypersensitivity pneumonitis panel and CBC all normal    Objective:   Physical Exam  No physical exam performed as the visit was via telephone.  Patient did not exhibit conversational dyspnea during the visit.      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   Cirrhosis of the liver in the setting of alpha-1 Gastroenterology  recommends AAT infusion Concur with this  recommendation  Appeal to insurance company for alpha-1 augmentation therapy is now on his next step.  Repeated voicemails were never answered by insurance company.  Follow-up in 3 months time or sooner should any new difficulties arise.  Total visit time 12 minutes.  Renold Don, MD [facility]   *This note was dictated using voice recognition software/Dragon.  Despite best efforts to proofread, errors can occur which can change the meaning.  Any change was purely unintentional.

## 2019-10-17 NOTE — Patient Instructions (Signed)
We continue to work on your appeal to get Prolastin  Continue Spiriva and Symbicort as you are doing.  A prescription of Spiriva was sent to your pharmacy.   We will see you in follow-up in 3 months time call sooner should any new difficulties arise.

## 2019-10-17 NOTE — Telephone Encounter (Signed)
Spoke to [place] with [employer], who stated that appeal is in process.  Nicole Kindred will call back with update.

## 2019-10-18 NOTE — Telephone Encounter (Signed)
Received request from [facility] for last 58mo of OV notes, PFT and imaging.  These documents have been faxed to [facility] as requested.

## 2019-10-19 ENCOUNTER — Other Ambulatory Visit: Payer: Self-pay

## 2019-10-19 MED ORDER — BUDESONIDE-FORMOTEROL FUMARATE 160-4.5 MCG/ACT IN AERO: 2.0000 | INHALATION_SPRAY | Freq: Two times a day (BID) | RESPIRATORY_TRACT | 12 refills | Status: DC

## 2019-10-20 MED ORDER — BREO ELLIPTA 200-25 MCG/INH IN AEPB: 1.0000 | INHALATION_SPRAY | Freq: Every day | RESPIRATORY_TRACT | 2 refills | Status: DC

## 2019-10-20 MED ORDER — BREO ELLIPTA 200-25 MCG/INH IN AEPB: 1.0000 | INHALATION_SPRAY | Freq: Every day | RESPIRATORY_TRACT | 2 refills | Status: AC

## 2019-10-20 NOTE — Telephone Encounter (Signed)
Dr. Patsey Berthold, Pt would like to try Breo in place of symbicort. Would be still continue with spiriva?

## 2019-10-20 NOTE — Telephone Encounter (Signed)
Dr. Gonzalez please advise. Thanks 

## 2019-10-20 NOTE — Telephone Encounter (Signed)
There is a generic of Symbicort just got approved by the FDA, not quite readily available 12 pharmacies.  I think Advair HFA would make his cough worse due to the very heavy suspension of the particle and delivery system.  Brio Ellipta would be okay however it is a powder.  But if he wants to try it we can switch him to [facility] 200/25, 1 inhalation daily.  He would have to rinse his mouth well afterwards.

## 2019-10-24 DIAGNOSIS — I10 Essential (primary) hypertension: Secondary | ICD-10-CM | POA: Diagnosis not present

## 2019-10-24 DIAGNOSIS — E1129 Type 2 diabetes mellitus with other diabetic kidney complication: Secondary | ICD-10-CM | POA: Diagnosis not present

## 2019-10-24 DIAGNOSIS — E782 Mixed hyperlipidemia: Secondary | ICD-10-CM | POA: Diagnosis not present

## 2019-10-24 DIAGNOSIS — I2510 Atherosclerotic heart disease of native coronary artery without angina pectoris: Secondary | ICD-10-CM | POA: Diagnosis not present

## 2019-10-30 ENCOUNTER — Other Ambulatory Visit: Payer: Self-pay | Admitting: Family Medicine

## 2019-10-31 ENCOUNTER — Other Ambulatory Visit: Payer: Self-pay | Admitting: Family Medicine

## 2019-10-31 DIAGNOSIS — F410 Panic disorder [episodic paroxysmal anxiety] without agoraphobia: Secondary | ICD-10-CM

## 2019-11-07 ENCOUNTER — Telehealth: Payer: Self-pay | Admitting: Pulmonary Disease

## 2019-11-07 NOTE — Telephone Encounter (Signed)
Received paper work   Medication name and strength: Prolastin PA approved/denied: approved Approval dates: 09/30/2019-11/02/2020 If denied, reason for denial:

## 2019-11-09 ENCOUNTER — Telehealth: Payer: Self-pay | Admitting: Pulmonary Disease

## 2019-11-09 MED ORDER — AZITHROMYCIN 250 MG PO TABS: 250.0000 mg | ORAL_TABLET | ORAL | 0 refills | Status: DC

## 2019-11-09 MED ORDER — PREDNISONE 10 MG PO TABS: ORAL_TABLET | ORAL | 0 refills | Status: DC

## 2019-11-09 NOTE — Telephone Encounter (Signed)
I spoke with the Alex [name] and notified of response per Aaron Edelman  Rxs were sent to his pharm  He wanted to hold off on pharm tele visit  He states just heard from prolastin nurse 11/08/19 and she advised him they are working on his appeal and he should here back in approx 2 wks  There were not any open schedules in [place] for 6 wks at this time so will need to keep this open for f/u and to schedule appt when we can  Will hold

## 2019-11-09 NOTE — Telephone Encounter (Signed)
Called and spoke with the pt  He is c/o increased DOE x 5 days  He gets winded walking room to room at home  He states that his cough is slightly worse and is prod with green sputum  He has not had any fever- checked temp while on phone and his read 94.6 (ear) (verified x 2) He is not wheezing or having any chest tightness, no leg swelling He states sats are 92% RA at rest and pulse 90  He is taking his spiriva and symbicort and is not prescribed a rescue inhaler  He is not on prolastin  Please advise as there are no openings in the schedule unfortunately, thanks!

## 2019-11-09 NOTE — Telephone Encounter (Signed)
Pt's message from Alex [name]...  We did not change over to the [employer] because it was the same cost as the Symbicort.  I am having a flare up again and can't walk to the bathroom in my home without running out of breath and I can sometimes cough up some green flem. This has been going on now for nearly a week. Still doing the same inhalers morning and night. I need some help with this.

## 2019-11-09 NOTE — Telephone Encounter (Signed)
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   Cirrhosis of the liver in the setting of alpha-1 Gastroenterology recommends AAT infusion Concur with this recommendation  Appeal to insurance company for alpha-1 augmentation therapy is now on his next step.  Repeated voicemails were never answered by insurance company.     Plan: Referral to clinical pharmacy team to see if they can help with alpha-1 antitrypsin deficiency access  Okay to offer:  Prednisone 10mg  tablet  >>>4 tabs for 2 days, then 3 tabs for 2 days, 2 tabs for 2 days, then 1 tab for 2 days, then stop >>>take with food  >>>take in the morning   Azithromycin 250mg  tablet  >>>Take 2 tablets (500mg  total) today, and then 1 tablet (250mg ) for the next four days  >>>take with food  >>>can also take probiotic and / or yogurt while on antibiotic   Please place order.   Schedule follow up with APP or CG in 6 weeks for in office visit in [place] clinic. Schedule telephonic visit with pharmacy team to see if they can help with patient access to AAT infusion.   Wyn Quaker FNP

## 2019-11-09 NOTE — Telephone Encounter (Signed)
Pt called back and wanted to know which pharmacy the meds were called into. Please call back

## 2019-11-09 NOTE — Telephone Encounter (Signed)
Spoke with pt. Advised him that his prescriptions were sent to [facility]. Nothing further needed.

## 2019-11-10 NOTE — Telephone Encounter (Signed)
Prolastin has been ordered but still in approval limbo. Continue current meds.

## 2019-11-12 NOTE — Telephone Encounter (Signed)
Right per patient appears an appeal has been placed.   Wyn Quaker FNP

## 2019-11-13 ENCOUNTER — Other Ambulatory Visit: Payer: Self-pay | Admitting: Family Medicine

## 2019-11-22 ENCOUNTER — Telehealth: Payer: Self-pay | Admitting: Pulmonary Disease

## 2019-11-22 NOTE — Telephone Encounter (Signed)
[name] called to say that the Prolastin has been approved but they are needing a copy of the approval letter. If we have it, she would like [place] to fax it to [phone] attn appeals dept.

## 2019-11-22 NOTE — Telephone Encounter (Signed)
Alex [name] from [place] returning a phone call. Alex [name] can be reached at [phone]

## 2019-11-22 NOTE — Telephone Encounter (Signed)
Spoke with Suanne Marker in [place]  There are no forms there on this pt regarding prolastin  Called [employer] at [phone] and had to [facility]

## 2019-11-22 NOTE — Telephone Encounter (Signed)
Spoke with Kendrick Fries and advised that we do not have the approval letter yet  She states that that is okay b/c med has already been shipped to the pt and they had a positive test claim   I am going to forward this to Dr Patsey Berthold to let her know that this pt is able to obtain the Prolastin- FYI

## 2019-11-23 ENCOUNTER — Telehealth: Payer: Self-pay | Admitting: Pulmonary Disease

## 2019-11-23 NOTE — Telephone Encounter (Signed)
Dr. Patsey Berthold Budesonide is not covered for this patient.  The alternatives are Wixela, Advair HFA, Breo, Dulera, Symbicort, Fluticasone  Would you like to switch him to one of these?

## 2019-11-24 NOTE — Telephone Encounter (Signed)
Thank you. Noted.

## 2019-11-24 NOTE — Telephone Encounter (Signed)
He is supposed to be on Symbicort and Spiriva, I do not know how this got changed. Anyway : Symbicort 160/4.5, 2 puffs BID and Spiriva Respimat 2.5 mcg, 2 puffs daily.  CLG

## 2019-11-27 NOTE — Telephone Encounter (Signed)
Dr. Patsey Berthold, The Budesonide/Symbicort is not covered for this patient. Would you like to switch him to something else that is covered?  These are covered: Wixela, Advair HFA, [place], Symbicort, Fluticasone

## 2019-11-29 MED ORDER — BREO ELLIPTA 200-25 MCG/INH IN AEPB: 1.0000 | INHALATION_SPRAY | Freq: Every day | RESPIRATORY_TRACT | 3 refills | Status: DC

## 2019-11-29 NOTE — Telephone Encounter (Signed)
Lets try Breo 200/25, 1 inhalation daily

## 2019-11-29 NOTE — Addendum Note (Signed)
Addended by: Lia Foyer R on: 11/29/2019 01:09 PM   Modules accepted: Orders

## 2019-11-29 NOTE — Telephone Encounter (Signed)
Called and spoke with patient about Symbicort and asked him of he was having any issues picking it up or anything. Patient stated that he has been fine on it and is paying $94 after insurance. Told him that we could switch him to [facility] and he said that it was $90 so he would like to stay where he is. Told patient to let us know if he has any issues to let us know.  Patient also did his first Prolastin infusion today.  Nothing further needed at this time.

## 2019-12-13 DIAGNOSIS — E1159 Type 2 diabetes mellitus with other circulatory complications: Secondary | ICD-10-CM | POA: Diagnosis not present

## 2019-12-13 LAB — HEMOGLOBIN A1C: Hemoglobin A1C: 8.9

## 2019-12-20 DIAGNOSIS — Z9641 Presence of insulin pump (external) (internal): Secondary | ICD-10-CM | POA: Diagnosis not present

## 2019-12-20 DIAGNOSIS — E1159 Type 2 diabetes mellitus with other circulatory complications: Secondary | ICD-10-CM | POA: Diagnosis not present

## 2019-12-20 DIAGNOSIS — E1165 Type 2 diabetes mellitus with hyperglycemia: Secondary | ICD-10-CM | POA: Diagnosis not present

## 2019-12-22 DIAGNOSIS — L218 Other seborrheic dermatitis: Secondary | ICD-10-CM | POA: Diagnosis not present

## 2019-12-22 DIAGNOSIS — L728 Other follicular cysts of the skin and subcutaneous tissue: Secondary | ICD-10-CM | POA: Diagnosis not present

## 2019-12-22 DIAGNOSIS — C44319 Basal cell carcinoma of skin of other parts of face: Secondary | ICD-10-CM | POA: Diagnosis not present

## 2019-12-22 DIAGNOSIS — D235 Other benign neoplasm of skin of trunk: Secondary | ICD-10-CM | POA: Diagnosis not present

## 2019-12-22 DIAGNOSIS — D485 Neoplasm of uncertain behavior of skin: Secondary | ICD-10-CM | POA: Diagnosis not present

## 2020-01-16 NOTE — Telephone Encounter (Signed)
Please see 11/22/2019 phone note. Prolastin has been approved.  Will close encounter.

## 2020-01-24 DIAGNOSIS — E119 Type 2 diabetes mellitus without complications: Secondary | ICD-10-CM | POA: Diagnosis not present

## 2020-01-25 DIAGNOSIS — C44319 Basal cell carcinoma of skin of other parts of face: Secondary | ICD-10-CM | POA: Diagnosis not present

## 2020-02-02 NOTE — Progress Notes (Deleted)
Complete physical exam   Patient: Alex [name]   DOB: 1966/03/30   54 y.o. Male  MRN: [id number] Visit Date: 02/05/2020  Today's healthcare provider: Lelon Huh, MD   No chief complaint on file.  Subjective    Alex [name] is a 54 y.o. male who presents today for a complete physical exam.  He reports consuming a {diet types:17450} diet. {Exercise:19826} He generally feels {well/fairly well/poorly:18703}. He reports sleeping {well/fairly well/poorly:18703}. He {does/does not:200015} have additional problems to discuss today.  HPI  Hypertension, follow-up  BP Readings from Last 3 Encounters:  10/13/19 (!) 144/86  09/19/19 (!) 142/78  08/23/19 (!) 142/78   Wt Readings from Last 3 Encounters:  10/13/19 238 lb (108 kg)  09/19/19 237 lb (107.5 kg)  08/23/19 235 lb 9.6 oz (106.9 kg)     He was last seen for hypertension 3 months ago.  BP at that visit was 144/86. Management since that visit includes doubling the dose of Losartan from 25mg  to Losartan 50 MG tablet.  He reports {excellent/good/fair/poor:19665} compliance with treatment. He {is/is not:9024} having side effects. {document side effects if present:1} He is following a {diet:21022986} diet. He {is/is not:9024} exercising. He {does/does not:200015} smoke.  Use of agents associated with hypertension: none.   Outside blood pressures are {***enter patient reported home BP readings, or 'not being checked':1}. Symptoms: {Yes/No:20286} chest pain {Yes/No:20286} chest pressure  {Yes/No:20286} palpitations {Yes/No:20286} syncope  {Yes/No:20286} dyspnea {Yes/No:20286} orthopnea  {Yes/No:20286} paroxysmal nocturnal dyspnea {Yes/No:20286} lower extremity edema   Pertinent labs: Lab Results  Component Value Date   CHOL 154 06/14/2019   HDL 34 (A) 06/14/2019   LDLCALC 83 06/14/2019   TRIG 185 (A) 06/14/2019   Lab Results  Component Value Date   NA 139 06/14/2019   K 4.9 06/14/2019   CREATININE 1.0 06/14/2019    GFRNONAA 79 06/14/2019     The ASCVD Risk score (Goff DC Jr., et al., 2013) failed to calculate for the following reasons:   The patient has a prior MI or stroke diagnosis   ---------------------------------------------------------------------------------------------------  Diabetes Mellitus Type II, Follow-up  Lab Results  Component Value Date   HGBA1C 6.8 06/14/2019   HGBA1C 7.3 01/06/2017   Wt Readings from Last 3 Encounters:  10/13/19 238 lb (108 kg)  09/19/19 237 lb (107.5 kg)  08/23/19 235 lb 9.6 oz (106.9 kg)   Last seen for diabetes 3 months ago.  Management since then includes conntuing same medications and continuing routine follow up Dr. Gabriel Carina. He reports {excellent/good/fair/poor:19665} compliance with treatment. He {is/is not:21021397} having side effects. {document side effects if present:1}   Pertinent Labs: Lab Results  Component Value Date   CHOL 154 06/14/2019   HDL 34 (A) 06/14/2019   LDLCALC 83 06/14/2019   TRIG 185 (A) 06/14/2019   Lab Results  Component Value Date   NA 139 06/14/2019   K 4.9 06/14/2019   CREATININE 1.0 06/14/2019   GFRNONAA 79 06/14/2019     ---------------------------------------------------------------------------------------------------   Follow up for Panic Disorder:  The patient was last seen for this 3 months ago. Changes made at last visit include advising patient to try cutting Sertraline back to 1/2 tablet of 100mg  tablets since symptoms have been stable for several years.  He reports {excellent/good/fair/poor:19665} compliance with treatment. He feels that condition is {improved/worse/unchanged:3041574}. He {is/is not:21021397} having side effects. ***  -----------------------------------------------------------------------------------------    Past Medical History:  Diagnosis Date   Allergy    Cirrhosis ([place])  Diabetes mellitus without complication (HCC)    GERD (gastroesophageal reflux disease)     History of chicken pox    Myocardial infarction [facility])    08   Past Surgical History:  Procedure Laterality Date   ESOPHAGOGASTRODUODENOSCOPY (EGD) WITH PROPOFOL N/A 05/01/2019   Procedure: ESOPHAGOGASTRODUODENOSCOPY (EGD) WITH PROPOFOL;  Surgeon: Jonathon Bellows, MD;  Location: [facility] ENDOSCOPY;  Service: Gastroenterology;  Laterality: N/A;   heart artery stent  2008   KNEE SURGERY     Myocrasial Perfusion Scan  09/13/2011   Abnormal images. Not thought to be significant per Dr. Nehemiah Massed   Social History   Socioeconomic History   Marital status: Married    Spouse name: Not on file   Number of children: Not on file   Years of education: Not on file   Highest education level: Not on file  Occupational History   Occupation: Engineer, site  Tobacco Use   Smoking status: Former Smoker    Packs/day: 1.50    Years: 20.00    Pack years: 30.00    Types: Cigarettes    Quit date: 08/10/2006    Years since quitting: 13.4   Smokeless tobacco: Never Used  Substance and Sexual Activity   Alcohol use: No    Alcohol/week: 0.0 standard drinks   Drug use: No   Sexual activity: Not on file  Other Topics Concern   Not on file  Social History Narrative   Not on file   Social Determinants of Health   Financial Resource Strain:    Difficulty of Paying Living Expenses:   Food Insecurity:    Worried About Charity fundraiser in the Last Year:    Arboriculturist in the Last Year:   Transportation Needs:    Film/video editor (Medical):    Lack of Transportation (Non-Medical):   Physical Activity:    Days of Exercise per Week:    Minutes of Exercise per Session:   Stress:    Feeling of Stress :   Social Connections:    Frequency of Communication with Friends and Family:    Frequency of Social Gatherings with Friends and Family:    Attends Religious Services:    Active Member of Clubs or Organizations:    Attends Music therapist:     Marital Status:   Intimate Partner Violence:    Fear of Current or Ex-Partner:    Emotionally Abused:    Physically Abused:    Sexually Abused:    Family Status  Relation Name Status   Mother  Alive   Father  Deceased at age 82       Sclerosis   Brother  Alive   Other family hx (Not Specified)   Family History  Problem Relation Age of Onset   Hypertension Mother    Diabetes Mother        insulin dependent   Hyperlipidemia Mother    Diabetes Other    Hypertension Other    Heart disease Other    Arthritis Other    Allergies  Allergen Reactions   Aleve  [Naproxen Sodium]    Mobic  [Meloxicam]    Voltaren  [Diclofenac Sodium]     Patient Care Team: Birdie Sons, MD as PCP - General (Family Medicine) Corey Skains, MD as Consulting Physician (Internal Medicine) Gabriel Carina, Betsey Holiday, MD as Physician Assistant (Internal Medicine)   Medications: Outpatient Medications Prior to Visit  Medication Sig   azithromycin (ZITHROMAX) 250 MG  tablet Take 1 tablet (250 mg total) by mouth as directed.   clopidogrel (PLAVIX) 75 MG tablet TAKE 1 TABLET BY MOUTH EVERY DAY   Coenzyme Q10 (COQ10) 200 MG CAPS Take 1 capsule by mouth 2 (two) times daily.   Cyanocobalamin (VITAMIN B 12) 250 MCG LOZG Take by mouth.   fluticasone (FLONASE) 50 MCG/ACT nasal spray Place into the nose.   glucose blood test strip    insulin regular human CONCENTRATED (HUMULIN R) 500 UNIT/ML injection Use up to 250 units (as calculated by U100 syringe) daily via insulin pump   LORazepam (ATIVAN) 0.5 MG tablet Take by mouth. 1-2 every 6 hours as needed   losartan (COZAAR) 50 MG tablet Take 1 tablet (50 mg total) by mouth daily. PLEASE NOTE CHANGE IN DOSE FROM 25 TO 50MG    metFORMIN (GLUCOPHAGE) 500 MG tablet Take 500 mg by mouth 2 (two) times daily.    metoCLOPramide (REGLAN) 10 MG tablet Take 1 tablet (10 mg total) by mouth 4 (four) times daily as needed.   montelukast (SINGULAIR) 10 MG  tablet TAKE 1 TABLET(10 MG) BY MOUTH AT BEDTIME   Multiple Vitamin (MULTIVITAMIN) tablet Take 1 tablet by mouth daily.   Omega-3 Fatty Acids (FISH OIL) 1000 MG CAPS Take 1 capsule by mouth 2 (two) times daily.    pantoprazole (PROTONIX) 40 MG tablet TAKE 1 TABLET BY MOUTH EVERY DAY   predniSONE (DELTASONE) 10 MG tablet 4 x 2 days, 3 x 2 days, 2 x 2 days, 1 x 2 days, then stop   rosuvastatin (CRESTOR) 40 MG tablet TAKE 1 TABLET(40 MG) BY MOUTH DAILY   sertraline (ZOLOFT) 100 MG tablet Take 0.5-1 tablets (50-100 mg total) by mouth daily.   Tiotropium Bromide Monohydrate (SPIRIVA RESPIMAT) 2.5 MCG/ACT AERS Inhale 2 puffs into the lungs daily.   No facility-administered medications prior to visit.    Review of Systems  Constitutional: Negative for appetite change, chills, fatigue and fever.  HENT: Negative for congestion, ear pain, hearing loss, nosebleeds and trouble swallowing.   Eyes: Negative for pain and visual disturbance.  Respiratory: Negative for cough, chest tightness and shortness of breath.   Cardiovascular: Negative for chest pain, palpitations and leg swelling.  Gastrointestinal: Negative for abdominal pain, blood in stool, constipation, diarrhea, nausea and vomiting.  Endocrine: Negative for polydipsia, polyphagia and polyuria.  Genitourinary: Negative for dysuria and flank pain.  Musculoskeletal: Negative for arthralgias, back pain, joint swelling, myalgias and neck stiffness.  Skin: Negative for color change, rash and wound.  Neurological: Negative for dizziness, tremors, seizures, speech difficulty, weakness, light-headedness and headaches.  Psychiatric/Behavioral: Negative for behavioral problems, confusion, decreased concentration, dysphoric mood and sleep disturbance. The patient is not nervous/anxious.   All other systems reviewed and are negative.   {Heme   Chem   Endocrine   Serology   Results Review (optional):23779::" "}  Objective    There were no vitals  taken for this visit. {Show previous vital signs (optional):23777::" "}  Physical Exam  ***  Last depression screening scores PHQ 2/9 Scores 07/05/2017  PHQ - 2 Score 0  PHQ- 9 Score 0   Last fall risk screening No flowsheet data found. Last Audit-C alcohol use screening Alcohol Use Disorder Test (AUDIT) 04/20/2018  1. How often do you have a drink containing alcohol? 0  2. How many drinks containing alcohol do you have on a typical day when you are drinking? 0  3. How often do you have six or more drinks on one  occasion? 0  AUDIT-C Score 0  Alcohol Brief Interventions/Follow-up AUDIT Score <7 follow-up not indicated   A score of 3 or more in women, and 4 or more in men indicates increased risk for alcohol abuse, EXCEPT if all of the points are from question 1   No results found for any visits on 02/05/20.  Assessment & Plan    Routine Health Maintenance and Physical Exam  Exercise Activities and Dietary recommendations Goals   None     Immunization History  Administered Date(s) Administered   Hep A / Hep B 04/06/2019, 05/04/2019   Influenza,inj,Quad PF,6+ Mos 04/20/2018, 04/10/2019   Influenza-Unspecified 04/28/2017   Pneumococcal Polysaccharide-23 07/23/2011   Tdap 07/23/2011   Zoster Recombinat (Shingrix) 04/20/2018, 06/22/2018    Health Maintenance  Topic Date Due   OPHTHALMOLOGY EXAM  Never done   COVID-19 Vaccine (1) Never done   COLONOSCOPY  Never done   FOOT EXAM  07/05/2018   HEMOGLOBIN A1C  12/12/2019   INFLUENZA VACCINE  03/10/2020   TETANUS/TDAP  07/22/2021   PNEUMOCOCCAL POLYSACCHARIDE VACCINE AGE 37-64 HIGH RISK  Completed   Hepatitis C Screening  Completed   HIV Screening  Completed    Discussed health benefits of physical activity, and encouraged him to engage in regular exercise appropriate for his age and condition.  ***  No follow-ups on file.     {provider attestation***:1}   Lelon Huh, MD  [facility] [phone] (phone) [phone] (fax)  [place]

## 2020-02-05 ENCOUNTER — Encounter: Payer: Self-pay | Admitting: Family Medicine

## 2020-02-19 ENCOUNTER — Other Ambulatory Visit: Payer: Self-pay

## 2020-02-19 MED ORDER — BUDESONIDE-FORMOTEROL FUMARATE 160-4.5 MCG/ACT IN AERO: 2.0000 | INHALATION_SPRAY | Freq: Two times a day (BID) | RESPIRATORY_TRACT | 12 refills | Status: DC

## 2020-03-18 DIAGNOSIS — E1159 Type 2 diabetes mellitus with other circulatory complications: Secondary | ICD-10-CM | POA: Diagnosis not present

## 2020-03-25 DIAGNOSIS — E1165 Type 2 diabetes mellitus with hyperglycemia: Secondary | ICD-10-CM | POA: Diagnosis not present

## 2020-03-25 DIAGNOSIS — E1159 Type 2 diabetes mellitus with other circulatory complications: Secondary | ICD-10-CM | POA: Diagnosis not present

## 2020-03-25 DIAGNOSIS — Z9641 Presence of insulin pump (external) (internal): Secondary | ICD-10-CM | POA: Diagnosis not present

## 2020-04-23 ENCOUNTER — Other Ambulatory Visit: Payer: Self-pay | Admitting: Family Medicine

## 2020-04-23 DIAGNOSIS — F410 Panic disorder [episodic paroxysmal anxiety] without agoraphobia: Secondary | ICD-10-CM

## 2020-04-23 NOTE — Telephone Encounter (Signed)
Requested Prescriptions  Pending Prescriptions Disp Refills   sertraline (ZOLOFT) 100 MG tablet [Pharmacy Med Name: SERTRALINE 100MG  TABLETS] 90 tablet 1    Sig: TAKE 1/2 TO 1 TABLET(50 TO 100 MG) BY MOUTH DAILY     Psychiatry:  Antidepressants - SSRI Failed - 04/23/2020  3:30 AM      Failed - Valid encounter within last 6 months    Recent Outpatient Visits          6 months ago Essential (primary) hypertension   [facility] Birdie Sons, MD   1 year ago [place], Donald E, MD   1 year ago Need for shingles vaccine   [facility] Birdie Sons, MD   1 year ago Pelvic pain   [facility] Birdie Sons, MD   2 years ago Need for influenza vaccination   [facility] Birdie Sons, MD              clopidogrel (PLAVIX) 75 MG tablet [Pharmacy Med Name: CLOPIDOGREL 75MG  TABLETS] 90 tablet 1    Sig: TAKE 1 TABLET BY [place]     Hematology: Antiplatelets - clopidogrel Failed - 04/23/2020  3:30 AM      Failed - Evaluate AST, ALT within 2 months of therapy initiation.      Failed - HCT in normal range and within 180 days    HCT  Date Value Ref Range Status  08/23/2019 43.2 39 - 52 % Final         Failed - HGB in normal range and within 180 days    Hemoglobin  Date Value Ref Range Status  08/23/2019 14.6 13.0 - 17.0 g/dL Final         Failed - PLT in normal range and within 180 days    Platelets  Date Value Ref Range Status  08/23/2019 245 150 - 400 K/uL Final         Failed - Valid encounter within last 6 months    Recent Outpatient Visits          6 months ago Essential (primary) hypertension   [facility] Birdie Sons, MD   1 year ago [place], Donald E, MD   1 year ago Need for shingles vaccine   [facility] Birdie Sons, MD   1 year ago Pelvic pain   [facility] Birdie Sons, MD   2 years ago Need for influenza vaccination   [facility] Birdie Sons, MD             Passed - ALT in normal range and within 360 days    ALT  Date Value Ref Range Status  06/14/2019 32 10 - 40 Final         Passed - AST in normal range and within 360 days    AST  Date Value Ref Range Status  06/14/2019 36 14 - 40 Final

## 2020-04-23 NOTE — Telephone Encounter (Signed)
Requested medications are due for refill today? Yes  Requested medications are on active medication list? Yes  Last Refill:  10/30/2019 # 90 with one refill.    Future visit scheduled?  No   Notes to Clinic:  Medication failed Rx refill protocol due to no labs within the past 180 days.  Last labs were performed on 08/23/2019.  Patient is due for a 6 month follow-up visit as well.

## 2020-05-06 DIAGNOSIS — I10 Essential (primary) hypertension: Secondary | ICD-10-CM | POA: Diagnosis not present

## 2020-05-06 DIAGNOSIS — Z23 Encounter for immunization: Secondary | ICD-10-CM | POA: Diagnosis not present

## 2020-05-06 DIAGNOSIS — I2510 Atherosclerotic heart disease of native coronary artery without angina pectoris: Secondary | ICD-10-CM | POA: Diagnosis not present

## 2020-05-06 DIAGNOSIS — I6523 Occlusion and stenosis of bilateral carotid arteries: Secondary | ICD-10-CM | POA: Diagnosis not present

## 2020-05-09 ENCOUNTER — Other Ambulatory Visit: Payer: Self-pay

## 2020-05-09 MED ORDER — BUDESONIDE-FORMOTEROL FUMARATE 160-4.5 MCG/ACT IN AERO: 2.0000 | INHALATION_SPRAY | Freq: Two times a day (BID) | RESPIRATORY_TRACT | 11 refills | Status: DC

## 2020-05-09 NOTE — Telephone Encounter (Signed)
Rx for Symbicort 160 has been sent to [facility], as requested by pharmacy.

## 2020-05-20 DIAGNOSIS — E119 Type 2 diabetes mellitus without complications: Secondary | ICD-10-CM | POA: Diagnosis not present

## 2020-05-21 ENCOUNTER — Other Ambulatory Visit: Payer: Self-pay | Admitting: Family Medicine

## 2020-05-21 DIAGNOSIS — F410 Panic disorder [episodic paroxysmal anxiety] without agoraphobia: Secondary | ICD-10-CM

## 2020-05-21 NOTE — Telephone Encounter (Signed)
Requested medications are due for refill today?  Yes  Requested medications are on active medication list? Yes  Last Refill:   04/23/2020  # 30 with no refills.  Courtesy refill with reminder for patient to call and schedule an appointment with provider.    Future visit scheduled?  No    Notes to Clinic:  Medication failed RX refill protocol due to no valid encounter in the last 6 months.

## 2020-05-24 IMAGING — US US LIVER ELASTOGRAPHY
1 series · 13 of 25 positions shown · non-contrast
Comparison: None.

CLINICAL DATA: Cirrhosis

EXAM:
US LIVER ELASTOGRAPHY
TECHNIQUE: Ultrasound elastography evaluation of the liver was performed. A
region of interest was placed in the right lobe of the liver.
Following application of a compressive sonographic pulse, shear
waves were detected in the adjacent hepatic tissue and the shear
wave velocity was calculated. Multiple assessments were performed at
the selected site. Median shear wave velocity is correlated to a
Metavir fibrosis score.

[Series 1: us liver elastography · 13 of 40 slices shown]
[im 1/40]
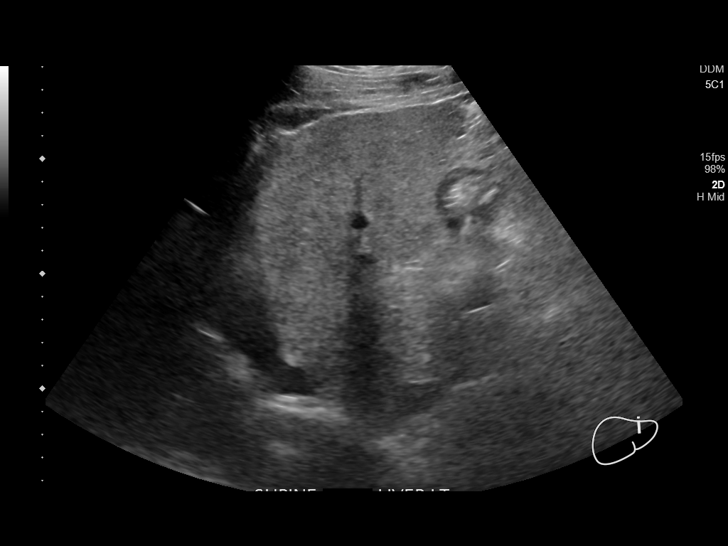
[im 4/40]
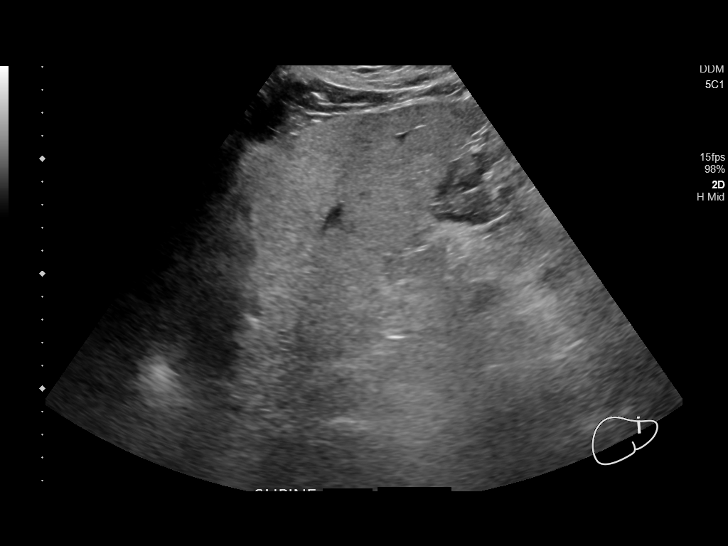
[im 7/40]
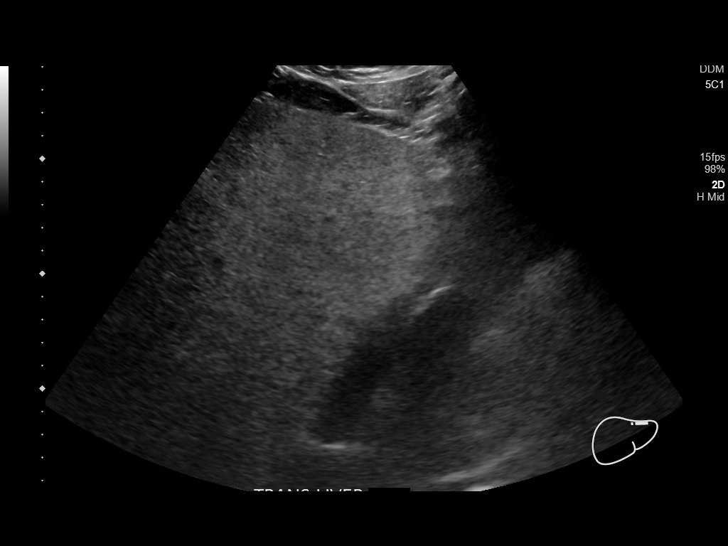
[im 10/40]
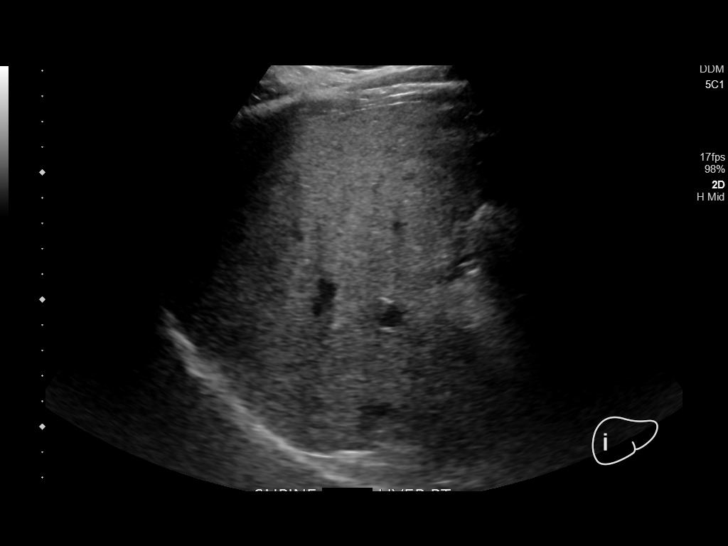
[im 14/40]
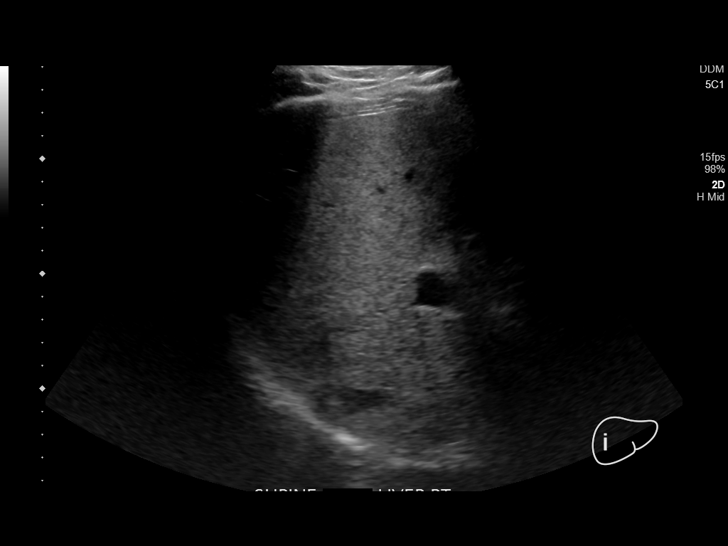
[im 17/40]
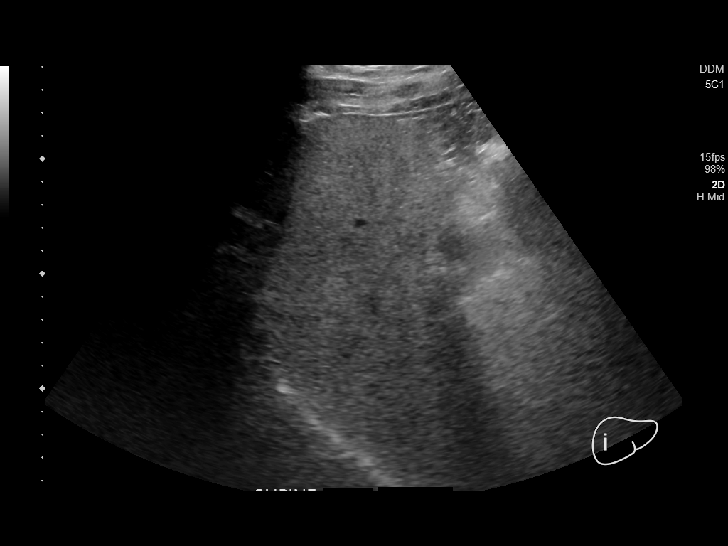
[im 20/40]
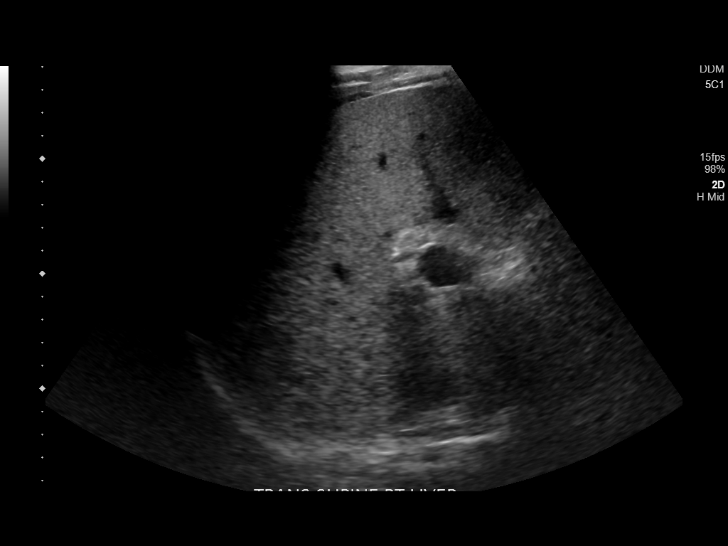
[im 23/40]
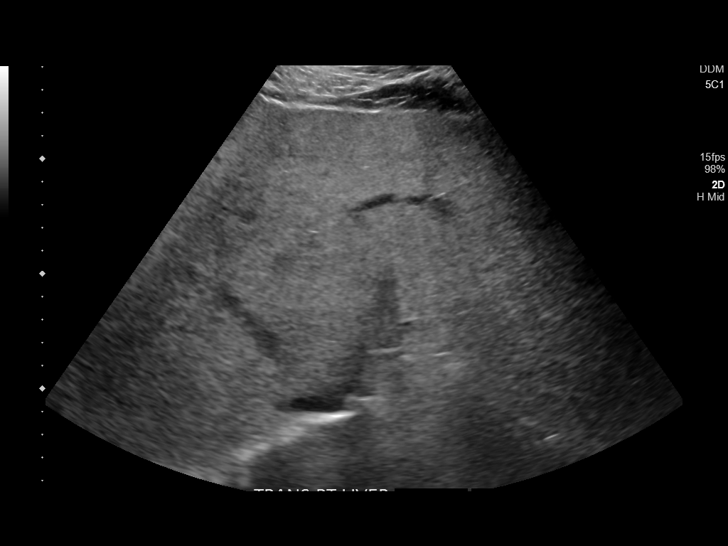
[im 27/40]
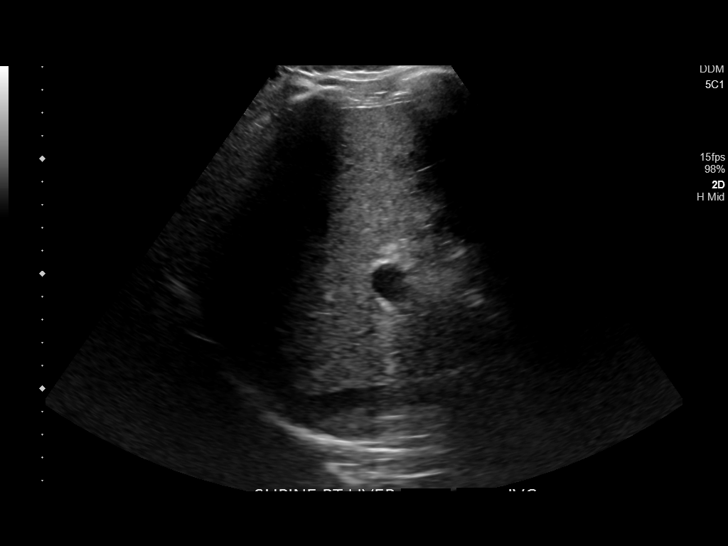
[im 30/40]
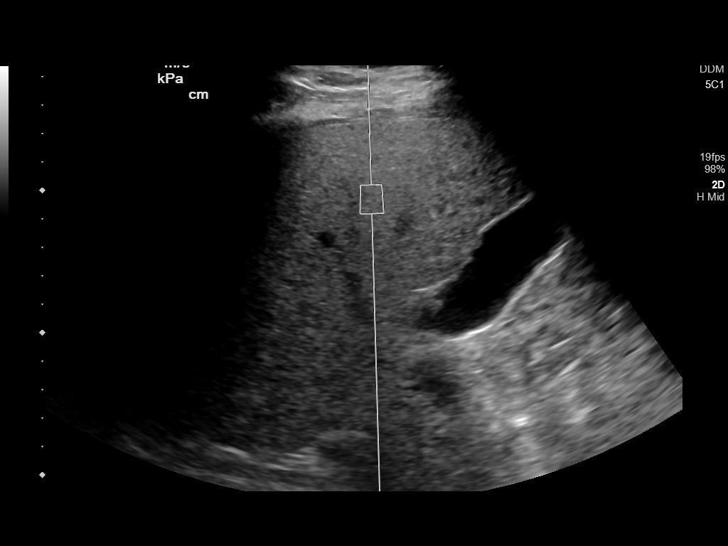
[im 33/40]
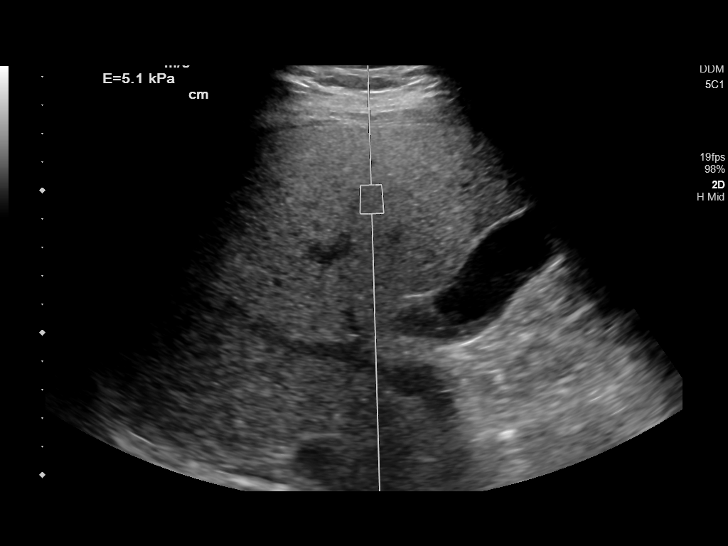
[im 36/40]
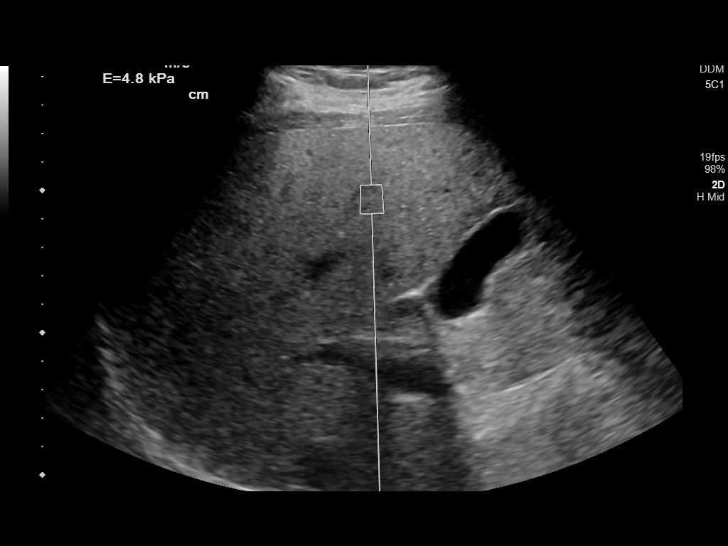
[im 40/40]
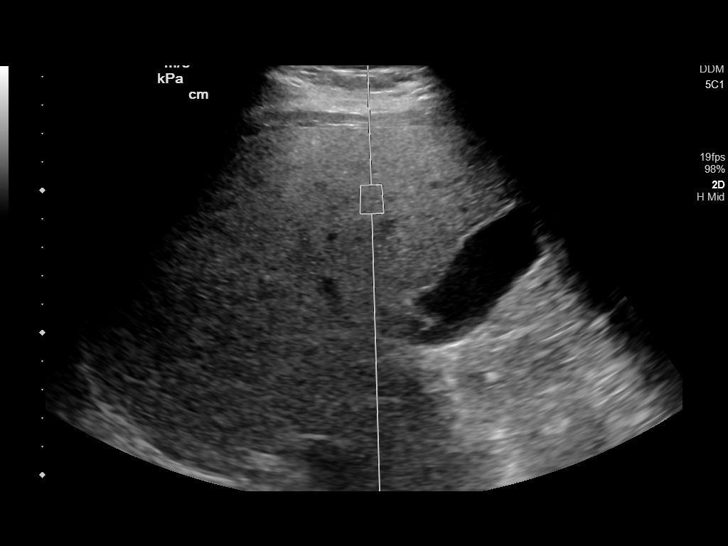

[13 of 25 positions shown; findings below may reference images not displayed]

FINDINGS: Liver: Increased echotexture throughout the liver compatible with
fatty infiltration or intrinsic liver disease. Mildly nodular
contours compatible with cirrhosis. No focal hepatic abnormality or
biliary ductal dilatation. Portal vein is patent on color Doppler
imaging with normal direction of blood flow towards the liver.

ULTRASOUND HEPATIC ELASTOGRAPHY

Device: Siemens Helix VTQ

Patient position: Supine

Transducer: [id number]

Number of measurements: 10

Hepatic segment:  8

Median velocity:   1.94 m/sec

IQR:

IQR/Median velocity ratio:

Corresponding Metavir fibrosis score:  F2 + some F3

Risk of fibrosis: Moderate

Limitations of exam: None

Please note that abnormal shear wave velocities may also be
identified in clinical settings other than with hepatic fibrosis,
such as: acute hepatitis, elevated right heart and central venous
pressures including use of beta blockers, Sin disease
(Stig-Olov), infiltrative processes such as
mastocytosis/amyloidosis/infiltrative tumor, extrahepatic
cholestasis, in the post-prandial state, and liver transplantation.
Correlation with patient history, laboratory data, and clinical
condition recommended.
IMPRESSION: Liver: Morphologic features compatible with cirrhosis.

Elastography: Median hepatic shear wave velocity is calculated at
1.94 m/sec.

Corresponding Metavir fibrosis score is F2 + some F3.

Risk of fibrosis is Moderate.

Follow-up: Additional testing appropriate

## 2020-05-30 DIAGNOSIS — D225 Melanocytic nevi of trunk: Secondary | ICD-10-CM | POA: Diagnosis not present

## 2020-05-30 DIAGNOSIS — D2271 Melanocytic nevi of right lower limb, including hip: Secondary | ICD-10-CM | POA: Diagnosis not present

## 2020-05-30 DIAGNOSIS — D2262 Melanocytic nevi of left upper limb, including shoulder: Secondary | ICD-10-CM | POA: Diagnosis not present

## 2020-05-30 DIAGNOSIS — D2261 Melanocytic nevi of right upper limb, including shoulder: Secondary | ICD-10-CM | POA: Diagnosis not present

## 2020-06-19 ENCOUNTER — Other Ambulatory Visit: Payer: Self-pay | Admitting: Family Medicine

## 2020-07-18 DIAGNOSIS — E119 Type 2 diabetes mellitus without complications: Secondary | ICD-10-CM | POA: Diagnosis not present

## 2020-09-04 DIAGNOSIS — E1165 Type 2 diabetes mellitus with hyperglycemia: Secondary | ICD-10-CM | POA: Diagnosis not present

## 2020-09-04 DIAGNOSIS — E1159 Type 2 diabetes mellitus with other circulatory complications: Secondary | ICD-10-CM | POA: Diagnosis not present

## 2020-09-04 LAB — LIPID PANEL
Cholesterol: 148 (ref 0–200)
HDL: 36 (ref 35–70)
LDL Cholesterol: 76
Triglycerides: 184 — AB (ref 40–160)

## 2020-09-04 LAB — HEMOGLOBIN A1C: Hemoglobin A1C: 8.2

## 2020-09-04 LAB — MICROALBUMIN, URINE: Microalb, Ur: 36

## 2020-09-06 DIAGNOSIS — Z9641 Presence of insulin pump (external) (internal): Secondary | ICD-10-CM | POA: Diagnosis not present

## 2020-09-06 DIAGNOSIS — E1165 Type 2 diabetes mellitus with hyperglycemia: Secondary | ICD-10-CM | POA: Diagnosis not present

## 2020-09-06 DIAGNOSIS — E1129 Type 2 diabetes mellitus with other diabetic kidney complication: Secondary | ICD-10-CM | POA: Diagnosis not present

## 2020-09-06 DIAGNOSIS — E1159 Type 2 diabetes mellitus with other circulatory complications: Secondary | ICD-10-CM | POA: Diagnosis not present

## 2020-10-18 IMAGING — CR DG CHEST 2V
1 series · 2 of 2 positions shown · non-contrast
Comparison: 12/05/2018, CT chest, 01/06/2019

CLINICAL DATA: Cough, shortness of breath ongoing

EXAM:
CHEST - 2 VIEW

[Series 1: dg chest 2 view · 0.14mm/px · 2 of 2 slices shown]
[im 1/2]
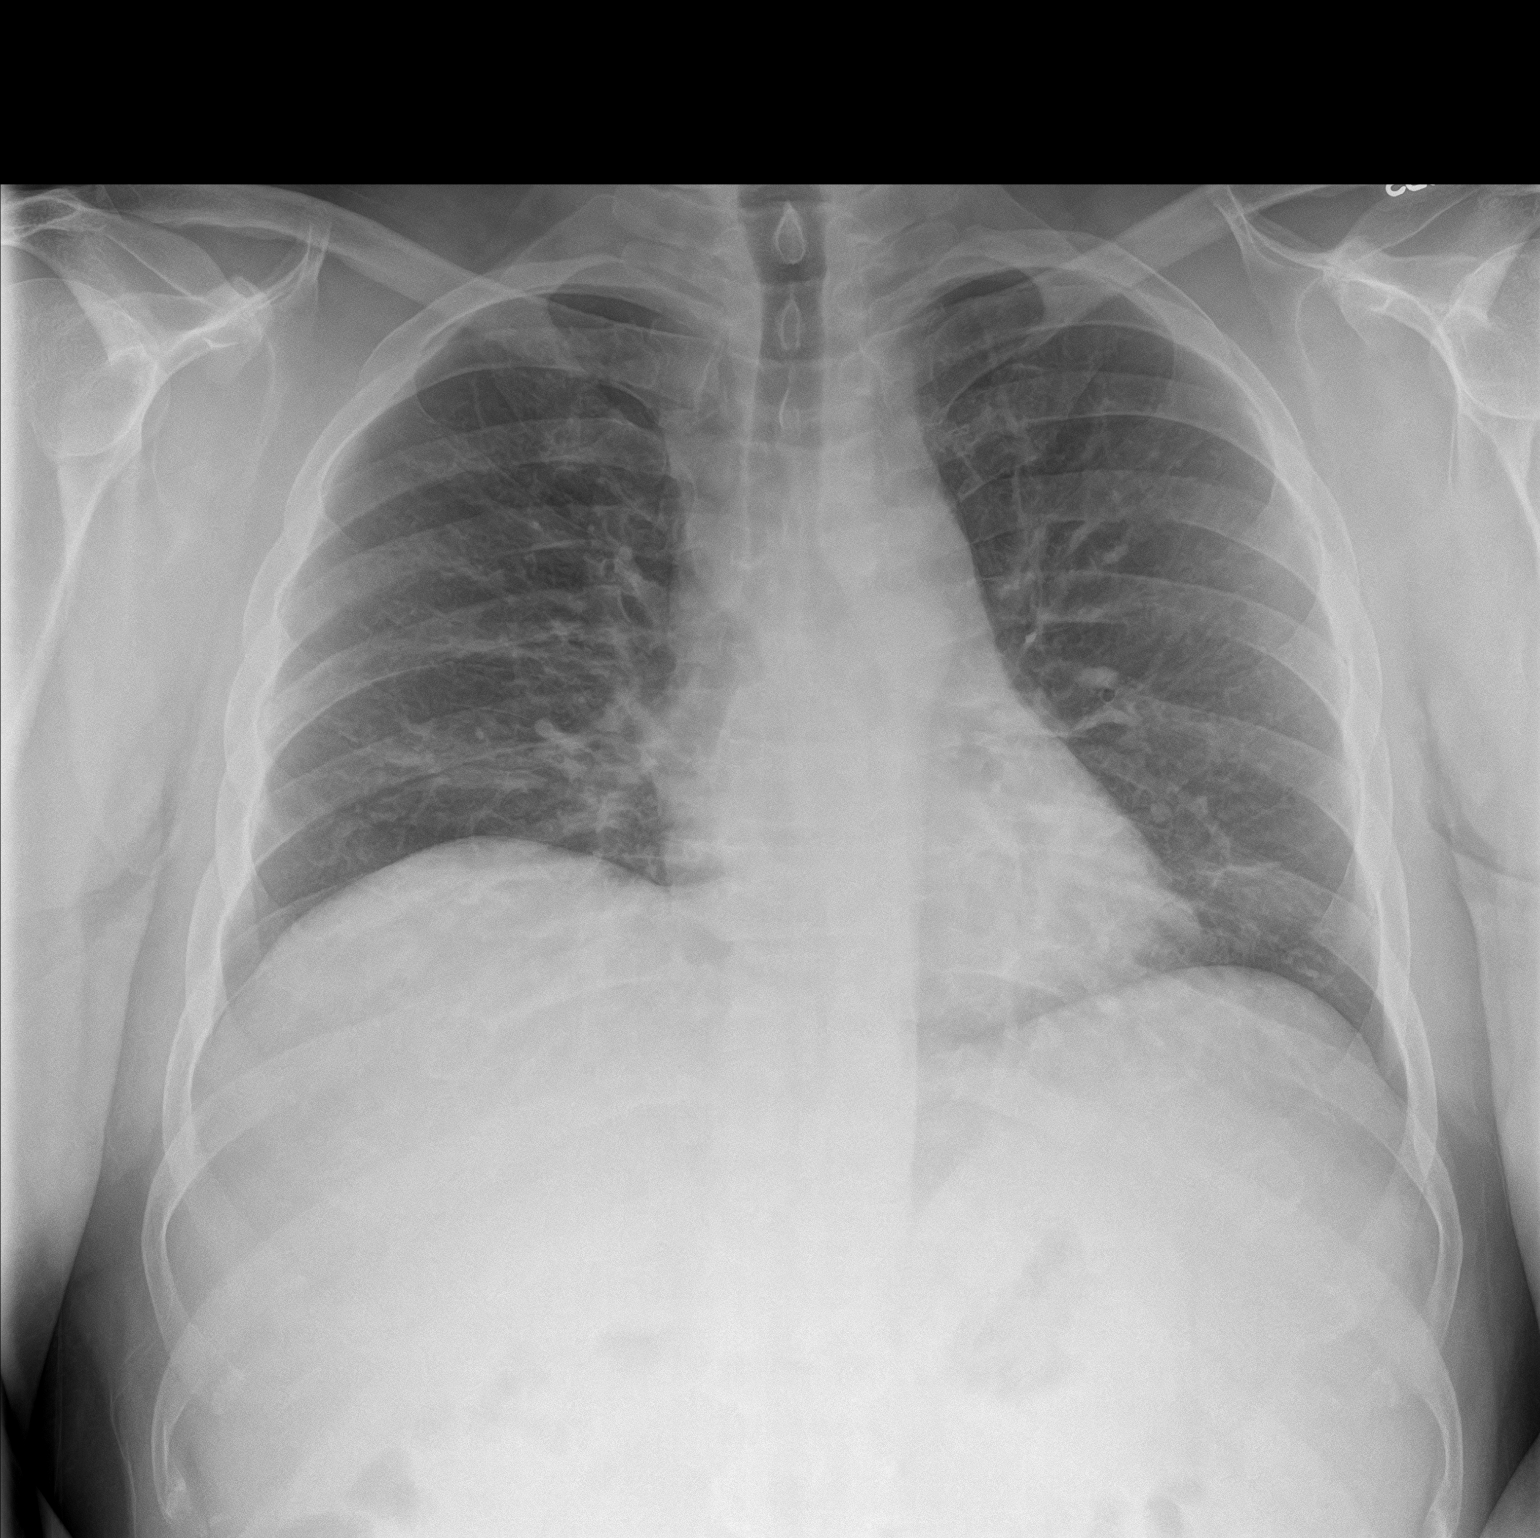
[im 2/2]
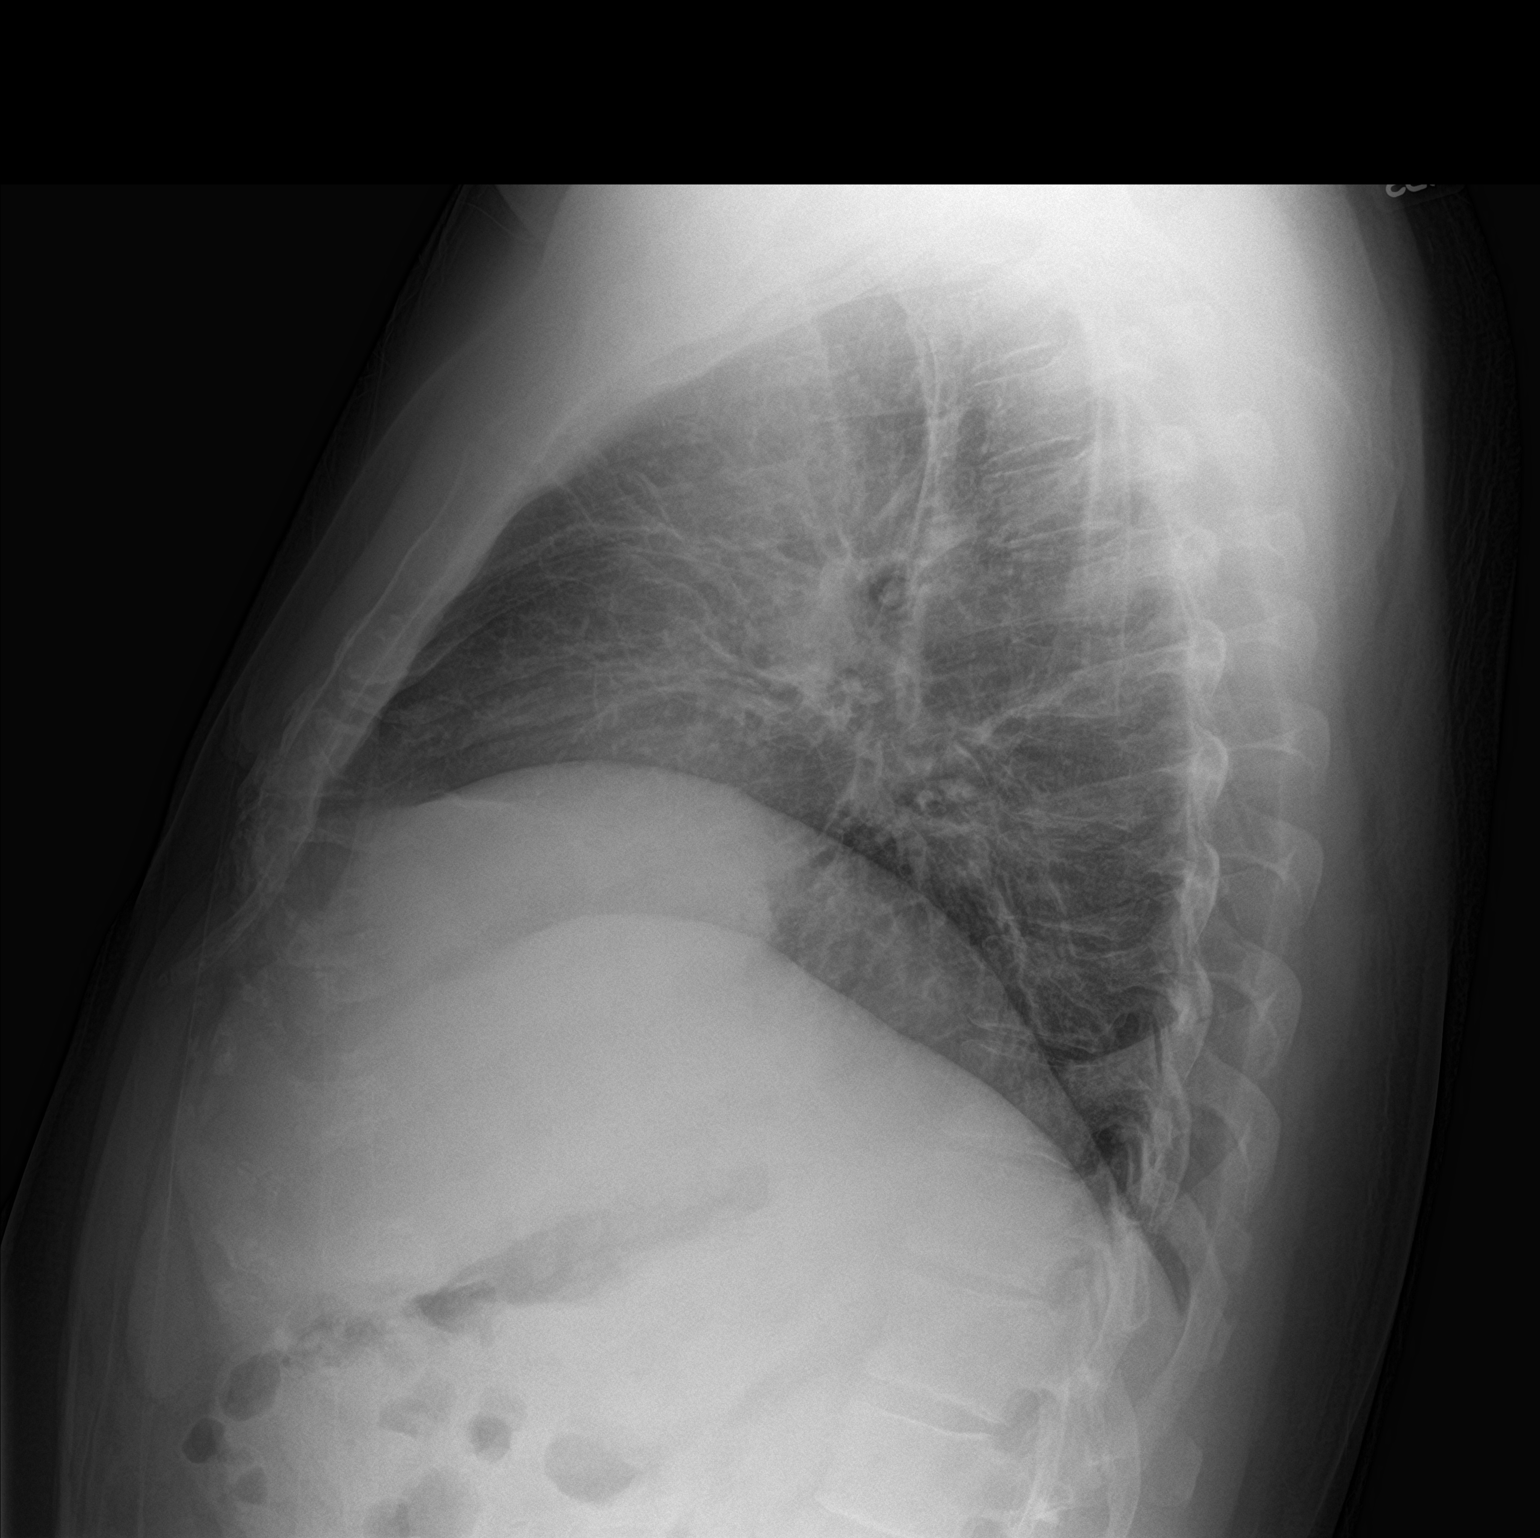

[2 of 2 positions shown; findings below may reference images not displayed]

FINDINGS: The heart size and mediastinal contours are within normal limits.
Both lungs are clear. The visualized skeletal structures are
unremarkable.
IMPRESSION: No acute abnormality of the lungs.

## 2020-10-22 ENCOUNTER — Emergency Department: Payer: BC Managed Care – PPO

## 2020-10-22 ENCOUNTER — Emergency Department
Admission: EM | Admit: 2020-10-22 | Discharge: 2020-10-22 | Disposition: A | Discharge status: Home / Self Care | Payer: BC Managed Care – PPO | Attending: Emergency Medicine | Admitting: Emergency Medicine

## 2020-10-22 ENCOUNTER — Other Ambulatory Visit: Payer: Self-pay

## 2020-10-22 ENCOUNTER — Encounter: Payer: Self-pay | Admitting: Emergency Medicine

## 2020-10-22 DIAGNOSIS — Z794 Long term (current) use of insulin: Secondary | ICD-10-CM | POA: Diagnosis not present

## 2020-10-22 DIAGNOSIS — N189 Chronic kidney disease, unspecified: Secondary | ICD-10-CM | POA: Insufficient documentation

## 2020-10-22 DIAGNOSIS — N200 Calculus of kidney: Secondary | ICD-10-CM | POA: Insufficient documentation

## 2020-10-22 DIAGNOSIS — Z7902 Long term (current) use of antithrombotics/antiplatelets: Secondary | ICD-10-CM | POA: Diagnosis not present

## 2020-10-22 DIAGNOSIS — R9431 Abnormal electrocardiogram [ECG] [EKG]: Secondary | ICD-10-CM | POA: Diagnosis not present

## 2020-10-22 DIAGNOSIS — I700 Atherosclerosis of aorta: Secondary | ICD-10-CM | POA: Diagnosis not present

## 2020-10-22 DIAGNOSIS — Z87891 Personal history of nicotine dependence: Secondary | ICD-10-CM | POA: Insufficient documentation

## 2020-10-22 DIAGNOSIS — E1122 Type 2 diabetes mellitus with diabetic chronic kidney disease: Secondary | ICD-10-CM | POA: Diagnosis not present

## 2020-10-22 DIAGNOSIS — K7689 Other specified diseases of liver: Secondary | ICD-10-CM | POA: Diagnosis not present

## 2020-10-22 DIAGNOSIS — Z7984 Long term (current) use of oral hypoglycemic drugs: Secondary | ICD-10-CM | POA: Insufficient documentation

## 2020-10-22 DIAGNOSIS — I2510 Atherosclerotic heart disease of native coronary artery without angina pectoris: Secondary | ICD-10-CM | POA: Insufficient documentation

## 2020-10-22 DIAGNOSIS — R1011 Right upper quadrant pain: Secondary | ICD-10-CM | POA: Diagnosis not present

## 2020-10-22 DIAGNOSIS — K8020 Calculus of gallbladder without cholecystitis without obstruction: Secondary | ICD-10-CM | POA: Diagnosis not present

## 2020-10-22 DIAGNOSIS — N132 Hydronephrosis with renal and ureteral calculous obstruction: Secondary | ICD-10-CM | POA: Diagnosis not present

## 2020-10-22 LAB — COMPREHENSIVE METABOLIC PANEL
ALT: 49 U/L — ABNORMAL HIGH (ref 0–44)
AST: 41 U/L (ref 15–41)
Albumin: 4.7 g/dL (ref 3.5–5.0)
Alkaline Phosphatase: 69 U/L (ref 38–126)
Anion gap: 9 (ref 5–15)
BUN: 24 mg/dL — ABNORMAL HIGH (ref 6–20)
CO2: 24 mmol/L (ref 22–32)
Calcium: 9.6 mg/dL (ref 8.9–10.3)
Chloride: 104 mmol/L (ref 98–111)
Creatinine, Ser: 1.49 mg/dL — ABNORMAL HIGH (ref 0.61–1.24)
GFR, Estimated: 56 mL/min — ABNORMAL LOW (ref >60)
Glucose, Bld: 214 mg/dL — ABNORMAL HIGH (ref 70–99)
Potassium: 4.1 mmol/L (ref 3.5–5.1)
Sodium: 137 mmol/L (ref 135–145)
Total Bilirubin: 0.8 mg/dL (ref 0.3–1.2)
Total Protein: 8.4 g/dL — ABNORMAL HIGH (ref 6.5–8.1)

## 2020-10-22 LAB — CBC
HCT: 42.8 % (ref 39.0–52.0)
Hemoglobin: 15.2 g/dL (ref 13.0–17.0)
MCH: 30.6 pg (ref 26.0–34.0)
MCHC: 35.5 g/dL (ref 30.0–36.0)
MCV: 86.3 fL (ref 80.0–100.0)
Platelets: 235 10*3/uL (ref 150–400)
RBC: 4.96 MIL/uL (ref 4.22–5.81)
RDW: 13 % (ref 11.5–15.5)
WBC: 12.5 10*3/uL — ABNORMAL HIGH (ref 4.0–10.5)
nRBC: 0 % (ref 0.0–0.2)

## 2020-10-22 LAB — URINALYSIS, COMPLETE (UACMP) WITH MICROSCOPIC
Bacteria, UA: NONE SEEN
Bilirubin Urine: NEGATIVE
Glucose, UA: NEGATIVE mg/dL
Ketones, ur: NEGATIVE mg/dL
Leukocytes,Ua: NEGATIVE
Nitrite: NEGATIVE
Protein, ur: NEGATIVE mg/dL
Specific Gravity, Urine: 1.012 (ref 1.005–1.030)
pH: 5 (ref 5.0–8.0)

## 2020-10-22 LAB — LIPASE, BLOOD: Lipase: 42 U/L (ref 11–51)

## 2020-10-22 MED ORDER — ONDANSETRON 4 MG PO TBDP
4.0000 mg | ORAL_TABLET | Freq: Three times a day (TID) | ORAL | 0 refills | Status: DC | PRN
Start: 2020-10-22 — End: 2020-10-22

## 2020-10-22 MED ORDER — TAMSULOSIN HCL 0.4 MG PO CAPS: 0.4000 mg | ORAL_CAPSULE | Freq: Every day | ORAL | 0 refills | Status: DC

## 2020-10-22 MED ORDER — TAMSULOSIN HCL 0.4 MG PO CAPS: 0.4000 mg | ORAL_CAPSULE | Freq: Every day | ORAL | 0 refills | Status: AC

## 2020-10-22 MED ORDER — ONDANSETRON HCL 4 MG/2ML IJ SOLN
4.0000 mg | Freq: Once | INTRAMUSCULAR | Status: AC
  Administered 2020-10-22: 4 mg via INTRAVENOUS
  Filled 2020-10-22: qty 2

## 2020-10-22 MED ORDER — SODIUM CHLORIDE 0.9 % IV BOLUS
1000.0000 mL | Freq: Once | INTRAVENOUS | Status: AC
  Administered 2020-10-22: 1000 mL via INTRAVENOUS

## 2020-10-22 MED ORDER — OXYCODONE-ACETAMINOPHEN 5-325 MG PO TABS: 1.0000 | ORAL_TABLET | ORAL | 0 refills | Status: DC | PRN

## 2020-10-22 MED ORDER — FENTANYL CITRATE (PF) 100 MCG/2ML IJ SOLN
50.0000 ug | Freq: Once | INTRAMUSCULAR | Status: AC
  Administered 2020-10-22: 50 ug via INTRAVENOUS
  Filled 2020-10-22: qty 2

## 2020-10-22 MED ORDER — ONDANSETRON 4 MG PO TBDP
4.0000 mg | ORAL_TABLET | Freq: Three times a day (TID) | ORAL | 0 refills | Status: DC | PRN
Start: 2020-10-22 — End: 2021-07-29

## 2020-10-22 NOTE — ED Triage Notes (Signed)
Patient presents to the ED with right upper quadrant abdominal pain.  Patient states pain has been severe and intermittent.  Patient reports area is tender to touch.  Patient denies vomiting and diarrhea.

## 2020-10-22 NOTE — ED Provider Notes (Signed)
[facility] Emergency Department Provider Note  Time seen: 10:01 AM  I have reviewed the triage vital signs and the nursing notes.   HISTORY  Chief Complaint Abdominal Pain   HPI Alex [name] is a 55 y.o. male with a past medical history of diabetes, gastric reflux, anxiety, hyperlipidemia, presents to the emergency department for right flank pain.  According to the patient since yesterday he has been experiencing pain in his right back and now right mid to right upper quadrant.  Describes it as a intermittent sharp pain severe at times, currently 7/10.  States nausea last night but denies any vomiting.  No diarrhea.  No association with food.  No fever.  Patient still has his gallbladder.  No history of kidney stones.  No dysuria or hematuria.   Past Medical History:  Diagnosis Date  . Allergy   . Cirrhosis ([place])   . Diabetes mellitus without complication ([place])   . GERD (gastroesophageal reflux disease)   . History of chicken pox   . Myocardial infarction [facility])    08    Patient Active Problem List   Diagnosis Date Noted  . AAT (alpha-1-antitrypsin) deficiency ([place]) 04/10/2019  . Cirrhosis of liver ([place]) 04/04/2019  . Fatty liver 04/04/2019  . Obesity 07/05/2017  . Gallbladder polyp 01/20/2016  . Allergic rhinitis 01/20/2016  . Fatigue 01/20/2016  . Other long term (current) drug therapy 01/20/2016  . Microalbuminuria 08/07/2014  . Carotid atherosclerosis 11/01/2013  . History of shingles 03/06/2011  . Anxiety disorder 10/08/2009  . 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 Type 2 diabetes mellitus with diabetic chronic kidney disease ([place]) 12/05/2008  . Essential (primary) hypertension 12/05/2008  . GERD (gastroesophageal reflux disease) 12/05/2008  . Panic disorder 08/11/2007  . Old myocardial infarct 02/08/2007  . HLD (hyperlipidemia) 03/24/2006  . Abnormal LFTs 08/10/2005  . CAD (coronary artery disease) 08/11/2003    Past Surgical  History:  Procedure Laterality Date  . ESOPHAGOGASTRODUODENOSCOPY (EGD) WITH PROPOFOL N/A 05/01/2019   Procedure: ESOPHAGOGASTRODUODENOSCOPY (EGD) WITH PROPOFOL;  Surgeon: Jonathon Bellows, MD;  Location: [facility] ENDOSCOPY;  Service: Gastroenterology;  Laterality: N/A;  . heart artery stent  2008  . KNEE SURGERY    . Myocrasial Perfusion Scan  09/13/2011   Abnormal images. Not thought to be significant per Dr. Nehemiah Massed    Prior to Admission medications   Medication Sig Start Date End Date Taking? Authorizing Provider  azithromycin (ZITHROMAX) 250 MG tablet Take 1 tablet (250 mg total) by mouth as directed. 11/09/19   Lauraine Rinne, NP  budesonide-formoterol (SYMBICORT) 160-4.5 MCG/ACT inhaler Inhale 2 puffs into the lungs 2 (two) times daily. 05/09/20   Tyler Pita, MD  clopidogrel (PLAVIX) 75 MG tablet TAKE 1 TABLET BY MOUTH EVERY DAY 04/24/20   Birdie Sons, MD  Coenzyme Q10 (COQ10) 200 MG CAPS Take 1 capsule by mouth 2 (two) times daily.    [provider]  Cyanocobalamin (VITAMIN B 12) 250 MCG LOZG Take by mouth.    [provider]  fluticasone (FLONASE) 50 MCG/ACT nasal spray Place into the nose. 08/31/13   [provider]  glucose blood test strip  02/07/13   [provider]  insulin regular human CONCENTRATED (HUMULIN R) 500 UNIT/ML injection Use up to 250 units (as calculated by U100 syringe) daily via insulin pump 02/16/17   [provider]  LORazepam (ATIVAN) 0.5 MG tablet Take by mouth. 1-2 every 6 hours as needed 07/13/13   [provider]  losartan (COZAAR) 50 MG tablet Take 1 tablet (50 mg total) by mouth daily. PLEASE NOTE CHANGE IN DOSE FROM 25 TO 50MG  10/13/19 10/12/20  Birdie Sons, MD  metFORMIN (GLUCOPHAGE) 500 MG tablet Take 500 mg by mouth 2 (two) times daily.     [provider]  metoCLOPramide (REGLAN) 10 MG tablet Take 1 tablet (10 mg total) by mouth 4 (four) times daily as needed. 07/06/17   Birdie Sons, MD   montelukast (SINGULAIR) 10 MG tablet TAKE 1 TABLET(10 MG) BY MOUTH AT BEDTIME 11/13/19   Birdie Sons, MD  Multiple Vitamin (MULTIVITAMIN) tablet Take 1 tablet by mouth daily.    [provider]  Omega-3 Fatty Acids (FISH OIL) 1000 MG CAPS Take 1 capsule by mouth 2 (two) times daily.  12/05/08   [provider]  pantoprazole (PROTONIX) 40 MG tablet TAKE 1 TABLET BY MOUTH EVERY DAY 06/19/20   Birdie Sons, MD  predniSONE (DELTASONE) 10 MG tablet 4 x 2 days, 3 x 2 days, 2 x 2 days, 1 x 2 days, then stop 11/09/19   Lauraine Rinne, NP  rosuvastatin (CRESTOR) 40 MG tablet TAKE 1 TABLET(40 MG) BY MOUTH DAILY 09/20/19   Birdie Sons, MD  sertraline (ZOLOFT) 100 MG tablet TAKE 1/2 TO 1 TABLET(50 TO 100 MG) BY MOUTH DAILY 04/23/20   Birdie Sons, MD  Tiotropium Bromide Monohydrate (SPIRIVA RESPIMAT) 2.5 MCG/ACT AERS Inhale 2 puffs into the lungs daily. 10/17/19   Tyler Pita, MD    Allergies  Allergen Reactions  . Aleve  [Naproxen Sodium]   . Mobic  [Meloxicam]   . Voltaren  [Diclofenac Sodium]     Family History  Problem Relation Age of Onset  . Hypertension Mother   . Diabetes Mother        insulin dependent  . Hyperlipidemia Mother   . Diabetes Other   . Hypertension Other   . Heart disease Other   . Arthritis Other     Social History Social History   Tobacco Use  . Smoking status: Former Smoker    Packs/day: 1.50    Years: 20.00    Pack years: 30.00    Types: Cigarettes    Quit date: 08/10/2006    Years since quitting: 14.2  . Smokeless tobacco: Never Used  Substance Use Topics  . Alcohol use: No    Alcohol/week: 0.0 standard drinks  . Drug use: No    Review of Systems Constitutional: Negative for fever. Cardiovascular: Negative for chest pain. Respiratory: Negative for shortness of breath. Gastrointestinal: Right flank pain.  Positive for nausea.  Negative for vomiting or diarrhea. Genitourinary: Negative for urinary  compaints Musculoskeletal: Negative for musculoskeletal complaints Neurological: Negative for headache All other ROS negative  ____________________________________________   PHYSICAL EXAM:  VITAL SIGNS: ED Triage Vitals [10/22/20 0904]  Enc Vitals Group     BP (!) 151/82     Pulse Rate 89     Resp 17     Temp 98.1 F (36.7 C)     Temp Source Oral     SpO2 98 %     Weight 235 lb (106.6 kg)     Height 5\' 10"  (1.778 m)     Head Circumference      Peak Flow      Pain Score      Pain Loc      Pain Edu?      Excl. in [place]?  Constitutional: Alert and oriented. Well appearing and in no distress. Eyes: Normal exam ENT      Head: Normocephalic and atraumatic.      Mouth/Throat: Mucous membranes are moist. Cardiovascular: Normal rate, regular rhythm.  Respiratory: Normal respiratory effort without tachypnea nor retractions. Breath sounds are clear  Gastrointestinal: Soft, mild to moderate right mid abdominal tenderness.  No rebound guarding or distention. Musculoskeletal: Nontender with normal range of motion in all extremities.  Neurologic:  Normal speech and language. No gross focal neurologic deficits  Skin:  Skin is warm, dry and intact.  Psychiatric: Mood and affect are normal.   ____________________________________________    EKG  EKG viewed and interpreted by myself shows a normal sinus rhythm 86 bpm with a narrow QRS, left axis deviation, largely normal intervals with nonspecific ST changes no ST elevation.  ____________________________________________    RADIOLOGY  CT scan shows 4 mm right UPJ stone.  ____________________________________________   INITIAL IMPRESSION / ASSESSMENT AND PLAN / ED COURSE  Pertinent labs & imaging results that were available during my care of the patient were reviewed by me and considered in my medical decision making (see chart for details).   Patient presents emergency department for right\right flank pain now right mid  abdominal pain.  Currently states pain is 7/10 intermittent and sharp.  Differential would include ureterolithiasis versus biliary pathology.  We will obtain a CT renal scan to further evaluate.  Patient agreeable to plan of care.  Blood work shows mild renal insufficiency, mild leukocytosis.  Lab work is large within normal limits besides mild hematuria with no signs of infection.  Urine culture has been sent as a precaution.  CT consistent with ureterolithiasis 4 mm right UPJ stone.  Discussed return precautions with the patient.  Patient's pain is down to a 3/10 per patient.  We will discharge her Flomax Zofran and Percocet, urology follow-up.  Patient agreeable.  ISHAM SMITHERMAN was evaluated in Emergency Department on 10/22/2020 for the symptoms described in the history of present illness. He was evaluated in the context of the global COVID-19 pandemic, which necessitated consideration that the patient might be at risk for infection with the SARS-CoV-2 virus that causes COVID-19. Institutional protocols and algorithms that pertain to the evaluation of patients at risk for COVID-19 are in a state of rapid change based on information released by regulatory bodies including the CDC and federal and state organizations. These policies and algorithms were followed during the patient's care in the ED.  ____________________________________________   FINAL CLINICAL IMPRESSION(S) / ED DIAGNOSES  Right flank pain Kidney stone   Harvest Dark, MD 10/22/20 1110

## 2020-10-22 NOTE — ED Notes (Signed)
Provided DC instructions. Verbalized understanding. Patient Alert and ambulatory on discharge.

## 2020-10-23 LAB — URINE CULTURE: Culture: <10000 — AB

## 2020-10-29 DIAGNOSIS — I10 Essential (primary) hypertension: Secondary | ICD-10-CM | POA: Diagnosis not present

## 2020-10-29 DIAGNOSIS — I2510 Atherosclerotic heart disease of native coronary artery without angina pectoris: Secondary | ICD-10-CM | POA: Diagnosis not present

## 2020-10-29 DIAGNOSIS — I6523 Occlusion and stenosis of bilateral carotid arteries: Secondary | ICD-10-CM | POA: Diagnosis not present

## 2020-10-29 DIAGNOSIS — I700 Atherosclerosis of aorta: Secondary | ICD-10-CM | POA: Diagnosis not present

## 2020-11-09 DIAGNOSIS — E119 Type 2 diabetes mellitus without complications: Secondary | ICD-10-CM | POA: Diagnosis not present

## 2020-11-26 ENCOUNTER — Other Ambulatory Visit: Payer: Self-pay

## 2020-11-26 ENCOUNTER — Ambulatory Visit (INDEPENDENT_AMBULATORY_CARE_PROVIDER_SITE_OTHER): Payer: BC Managed Care – PPO | Admitting: Family Medicine

## 2020-11-26 VITALS — BP 139/81 | HR 83 | Ht 71.0 in | Wt 240.0 lb

## 2020-11-26 DIAGNOSIS — M25512 Pain in left shoulder: Secondary | ICD-10-CM | POA: Diagnosis not present

## 2020-11-26 DIAGNOSIS — Z125 Encounter for screening for malignant neoplasm of prostate: Secondary | ICD-10-CM | POA: Diagnosis not present

## 2020-11-26 DIAGNOSIS — I10 Essential (primary) hypertension: Secondary | ICD-10-CM | POA: Diagnosis not present

## 2020-11-26 DIAGNOSIS — E8801 Alpha-1-antitrypsin deficiency: Secondary | ICD-10-CM

## 2020-11-26 DIAGNOSIS — Z1211 Encounter for screening for malignant neoplasm of colon: Secondary | ICD-10-CM | POA: Diagnosis not present

## 2020-11-26 DIAGNOSIS — F410 Panic disorder [episodic paroxysmal anxiety] without agoraphobia: Secondary | ICD-10-CM

## 2020-11-26 DIAGNOSIS — K219 Gastro-esophageal reflux disease without esophagitis: Secondary | ICD-10-CM

## 2020-11-26 DIAGNOSIS — G8929 Other chronic pain: Secondary | ICD-10-CM

## 2020-11-26 MED ORDER — PANTOPRAZOLE SODIUM 40 MG PO TBEC: 1.0000 | DELAYED_RELEASE_TABLET | Freq: Every day | ORAL | 4 refills | Status: DC

## 2020-11-26 MED ORDER — SERTRALINE HCL 50 MG PO TABS: 50.0000 mg | ORAL_TABLET | Freq: Every day | ORAL | 4 refills | Status: DC

## 2020-11-26 NOTE — Patient Instructions (Signed)
.   Start back on sertraline  By taking 1/2 tablet a day for 4-6 days, then increase to 1 tablet daily

## 2020-11-26 NOTE — Progress Notes (Signed)
Established patient visit   Patient: Alex [name]   DOB: [birth year]   55 y.o. Male  MRN: [id number] Visit Date: 11/26/2020  Today's healthcare provider: Lelon Huh, MD   No chief complaint on file.  Subjective    HPI   Patient presents for Rosuvastatin, pantoprazole, and sertraline refill.  Anxiety, Follow-up  He was last seen for anxiety 6 weeks ago. Changes made at last visit include sertraline (ZOLOFT) 100 MG tablet.   He reports excellent compliance with treatment. He reports excellent tolerance of treatment. He is not having side effects.   He feels his anxiety is mild (until he ran out of pills) and Improved since last visit. He had reduced to 1/2 of 100mg  daily which was working well.   GAD-7 Results No flowsheet data found.  PHQ-9 Scores PHQ9 SCORE ONLY 07/05/2017  PHQ-9 Total Score 0    --------------------------------------------------------------------------------------------------- Diabetes Mellitus Type II, Follow-up  Lab Results  Component Value Date   HGBA1C 8.9 12/13/2019   HGBA1C 6.8 06/14/2019   HGBA1C 7.3 01/06/2017   Wt Readings from Last 3 Encounters:  10/22/20 235 lb (106.6 kg)  10/13/19 238 lb (108 kg)  09/19/19 237 lb (107.5 kg)   He reports excellent compliance with treatment. He is not having side effects.   Home blood sugar records: 140s-150s  Episodes of hypoglycemia? Yes    Current insulin regiment: insulin pump Most Recent Eye Exam: unknown Current exercise: none Current diet habits: in general, a "healthy" diet     He continues to follow up with Dr. Gabriel Carina for diabetes management   Pertinent Labs: Lab Results  Component Value Date   CHOL 154 06/14/2019   HDL 34 (A) 06/14/2019   LDLCALC 83 06/14/2019   TRIG 185 (A) 06/14/2019   Lab Results  Component Value Date   NA 137 10/22/2020   K 4.1 10/22/2020   CREATININE 1.49 (H) 10/22/2020   GFRNONAA 56 (L) 10/22/2020   GLUCOSE 214 (H) 10/22/2020      --------------------------------------------------------------------------------------------------- He continue on Prolastin for alpha-1 antitrypsin deficiency which he is apparently getting through a patient assistance program. A nurse administers it to him IV weekly. His breathing and cough are greatly improved and he is no longer using Spiriva. He does continue using Symbicort twice a day. He has no follow up scheduled with pulmonary at this time.     Medications: Outpatient Medications Prior to Visit  Medication Sig Note  . alpha-1-proteinase inhibitor, human, (PROLASTIN-C) 1000 MG/20ML SOLN Inject 60 mg/kg into the vein once a week.   . budesonide-formoterol (SYMBICORT) 160-4.5 MCG/ACT inhaler Inhale 2 puffs into the lungs 2 (two) times daily.   . clopidogrel (PLAVIX) 75 MG tablet TAKE 1 TABLET BY MOUTH EVERY DAY   . Coenzyme Q10 (COQ10) 200 MG CAPS Take 1 capsule by mouth 2 (two) times daily.   . Cyanocobalamin (VITAMIN B 12) 250 MCG LOZG Take by mouth.   . fluticasone (FLONASE) 50 MCG/ACT nasal spray Place into the nose.   Marland Kitchen glucose blood test strip    . insulin regular human CONCENTRATED (HUMULIN R) 500 UNIT/ML injection Use up to 250 units (as calculated by U100 syringe) daily via insulin pump   . metFORMIN (GLUCOPHAGE) 500 MG tablet Take 500 mg by mouth 2 (two) times daily.    . Multiple Vitamin (MULTIVITAMIN) tablet Take 1 tablet by mouth daily.   . Omega-3 Fatty Acids (FISH OIL) 1000 MG CAPS Take 1 capsule by mouth 2 (two)  times daily.    . rosuvastatin (CRESTOR) 40 MG tablet TAKE 1 TABLET(40 MG) BY MOUTH DAILY   . Tiotropium Bromide Monohydrate (SPIRIVA RESPIMAT) 2.5 MCG/ACT AERS Inhale 2 puffs into the lungs daily.   . [DISCONTINUED] pantoprazole (PROTONIX) 40 MG tablet TAKE 1 TABLET BY MOUTH EVERY DAY   . LORazepam (ATIVAN) 0.5 MG tablet Take by mouth. 1-2 every 6 hours as needed (Patient not taking: Reported on 11/26/2020)   . losartan (COZAAR) 50 MG tablet Take 1 tablet (50  mg total) by mouth daily. PLEASE NOTE CHANGE IN DOSE FROM 25 TO 50MG    . metoCLOPramide (REGLAN) 10 MG tablet Take 1 tablet (10 mg total) by mouth 4 (four) times daily as needed. (Patient not taking: Reported on 11/26/2020)   . montelukast (SINGULAIR) 10 MG tablet TAKE 1 TABLET(10 MG) BY MOUTH AT BEDTIME (Patient not taking: Reported on 11/26/2020)   . ondansetron (ZOFRAN ODT) 4 MG disintegrating tablet Take 1 tablet (4 mg total) by mouth every 8 (eight) hours as needed for nausea or vomiting. (Patient not taking: Reported on 11/26/2020)   . tamsulosin (FLOMAX) 0.4 MG CAPS capsule Take 1 capsule (0.4 mg total) by mouth daily. (Patient not taking: Reported on 11/26/2020)   . [DISCONTINUED] azithromycin (ZITHROMAX) 250 MG tablet Take 1 tablet (250 mg total) by mouth as directed. (Patient not taking: Reported on 11/26/2020)   . [DISCONTINUED] oxyCODONE-acetaminophen (PERCOCET) 5-325 MG tablet Take 1 tablet by mouth every 4 (four) hours as needed for severe pain. (Patient not taking: Reported on 11/26/2020)   . [DISCONTINUED] predniSONE (DELTASONE) 10 MG tablet 4 x 2 days, 3 x 2 days, 2 x 2 days, 1 x 2 days, then stop (Patient not taking: Reported on 11/26/2020)   . [DISCONTINUED] sertraline (ZOLOFT) 100 MG tablet TAKE 1/2 TO 1 TABLET(50 TO 100 MG) BY MOUTH DAILY (Patient not taking: Reported on 11/26/2020) 11/26/2020: change to 50mg    No facility-administered medications prior to visit.      Objective    BP 139/81 (BP Location: Right Arm, Patient Position: Sitting, Cuff Size: Large)   Pulse 83   Ht 5\' 11"  (5.638 m)   Wt 240 lb (108.9 kg)   SpO2 99%   BMI 33.47 kg/m    Physical Exam   General: Appearance:    Obese male in no acute distress  Eyes:    PERRL, conjunctiva/corneas clear, EOM's intact       Lungs:     Clear to auscultation bilaterally, respirations unlabored  Heart:    Normal heart rate. Normal rhythm. No murmurs, rubs, or gallops.   MS:   All extremities are intact.   Neurologic:    Awake, alert, oriented x 3. No apparent focal neurological           defect.        Assessment & Plan     1. Panic disorder Was doing very well with 1/2 x 100mg  sertraline daily, but has been much more irritable since running out. Restart at - sertraline (ZOLOFT) 50 MG tablet; Take 1 tablet (50 mg total) by mouth daily.  Dispense: 90 tablet; Refill: 4 (advised to just take 1/2 tablet the first 4-6 days)   2. Essential (primary) hypertension Well controlled.  Continue current medications.   - Comprehensive metabolic panel  3. Colon cancer screening Is established patient of Dr. Vicente Males for GERD and liver disease and patient requests to see him.  - Ambulatory referral to gastroenterology for colonoscopy  4. Chronic left shoulder  pain Likely subacromial bursitis.  - Ambulatory referral to Orthopedic Surgery  5. Gastroesophageal reflux disease, unspecified whether esophagitis present Well controlled.  Continue.  - pantoprazole (PROTONIX) 40 MG tablet; Take 1 tablet (40 mg total) by mouth daily.  Dispense: 90 tablet; Refill: 4  6. Prostate cancer screening  - PSA Total (Reflex To Free) ([facility] only)  7. AAT (alpha-1-antitrypsin) deficiency ([place]) Is doing very well on.- alpha-1-proteinase inhibitor, human, (PROLASTIN-C) 1000 MG/20ML SOLN; Inject 60 mg/kg into the vein once a week.   Apparently getting treatment through patient assistance program. Doing well with Symbicort inhaler. No follow up currently scheduled with pulmonary.       The entirety of the information documented in the History of Present Illness, Review of Systems and Physical Exam were personally obtained by me. Portions of this information were initially documented by the CMA and reviewed by me for thoroughness and accuracy.      Lelon Huh, MD  [facility] [phone] (phone) [phone] (fax)  [facility]

## 2020-11-27 LAB — COMPREHENSIVE METABOLIC PANEL
ALT: 42 IU/L (ref 0–44)
AST: 35 IU/L (ref 0–40)
Albumin/Globulin Ratio: 2 (ref 1.2–2.2)
Albumin: 4.9 g/dL (ref 3.8–4.9)
Alkaline Phosphatase: 96 IU/L (ref 44–121)
BUN/Creatinine Ratio: 15 (ref 9–20)
BUN: 17 mg/dL (ref 6–24)
Bilirubin Total: 0.5 mg/dL (ref 0.0–1.2)
CO2: 22 mmol/L (ref 20–29)
Calcium: 10.3 mg/dL — ABNORMAL HIGH (ref 8.7–10.2)
Chloride: 100 mmol/L (ref 96–106)
Creatinine, Ser: 1.16 mg/dL (ref 0.76–1.27)
Globulin, Total: 2.5 g/dL (ref 1.5–4.5)
Glucose: 168 mg/dL — ABNORMAL HIGH (ref 65–99)
Potassium: 4.7 mmol/L (ref 3.5–5.2)
Sodium: 139 mmol/L (ref 134–144)
Total Protein: 7.4 g/dL (ref 6.0–8.5)
eGFR: 75 mL/min/{1.73_m2} (ref >59)

## 2020-11-27 LAB — PSA TOTAL (REFLEX TO FREE): Prostate Specific Ag, Serum: 2 ng/mL (ref 0.0–4.0)

## 2020-12-09 ENCOUNTER — Telehealth: Payer: Self-pay | Admitting: Family Medicine

## 2020-12-09 DIAGNOSIS — I2510 Atherosclerotic heart disease of native coronary artery without angina pectoris: Secondary | ICD-10-CM

## 2020-12-09 MED ORDER — ROSUVASTATIN CALCIUM 40 MG PO TABS: 40.0000 mg | ORAL_TABLET | Freq: Every day | ORAL | 4 refills | Status: DC

## 2020-12-09 NOTE — Telephone Encounter (Signed)
[facility] faxed refill request for the following medications:  rosuvastatin (CRESTOR) 40 MG tablet  Last Rx: 09/20/19 LOV: 11/26/20 Please advise. Thanks TNP

## 2020-12-12 ENCOUNTER — Telehealth: Payer: Self-pay

## 2020-12-12 ENCOUNTER — Telehealth (INDEPENDENT_AMBULATORY_CARE_PROVIDER_SITE_OTHER): Payer: Self-pay | Admitting: Gastroenterology

## 2020-12-12 DIAGNOSIS — Z1211 Encounter for screening for malignant neoplasm of colon: Secondary | ICD-10-CM

## 2020-12-12 MED ORDER — PEG 3350-KCL-NA BICARB-NACL 420 G PO SOLR: 4000.0000 mL | Freq: Once | ORAL | 0 refills | Status: AC

## 2020-12-12 NOTE — Telephone Encounter (Signed)
Patient has been advised per Dr. Vicente Males that the sodium is ok, its the diclofenac part which is likely the allergy. Sodium is present in salt that we eat and should be ok .   Patient has been informed that I will send Nulytely bowel prep to his pharmacy and has agreed to [facility] bowel prep.  Thanks,  [place], [place]

## 2020-12-12 NOTE — Telephone Encounter (Signed)
Dr. Vicente Males,  Patient has an allergy noted for Diclofenac Sodium.  He states it causes him to experience nausea and vomiting.  The bowel prep that is preferred by insurance is Nulytely and when I went to send to the pharmacy I received an alert for the allergy.  Please advise what I should send to pharmacy for prep.  I think most of the bowel preps contain sodium.  Thanks,  Friend, [place]

## 2020-12-12 NOTE — Progress Notes (Signed)
Gastroenterology Pre-Procedure Review  Request Date: 12/23/20 Requesting Physician: Dr. Vicente Males  PATIENT REVIEW QUESTIONS: The patient responded to the following health history questions as indicated:    1. Are you having any GI issues? no 2. Do you have a personal history of Polyps? no 3. Do you have a family history of Colon Cancer or Polyps? no 4. Diabetes Mellitus? no 5. Joint replacements in the past 12 months?no 6. Major health problems in the past 3 months?yes (Kidney stones 10/22/20) 7. Any artificial heart valves, MVP, or defibrillator?no    MEDICATIONS & ALLERGIES:    Patient reports the following regarding taking any anticoagulation/antiplatelet therapy:   Plavix, Coumadin, Eliquis, Xarelto, Lovenox, Pradaxa, Brilinta, or Effient? yes (Plavix prescribed by Dr. Caryn Section.  Blood thinner request sent.) Aspirin? no  Patient confirms/reports the following medications:  Current Outpatient Medications  Medication Sig Dispense Refill  . alpha-1-proteinase inhibitor, human, (PROLASTIN-C) 1000 MG/20ML SOLN Inject 60 mg/kg into the vein once a week.    Marland Kitchen ascorbic acid (VITAMIN C) 500 MG tablet Take by mouth.    . budesonide-formoterol (SYMBICORT) 160-4.5 MCG/ACT inhaler Inhale 2 puffs into the lungs 2 (two) times daily. 6 g 11  . clopidogrel (PLAVIX) 75 MG tablet TAKE 1 TABLET BY MOUTH EVERY DAY 90 tablet 2  . Coenzyme Q10 (COQ10) 200 MG CAPS Take 1 capsule by mouth 2 (two) times daily.    . Continuous Blood Gluc Sensor (FREESTYLE LIBRE 14 DAY SENSOR) MISC APPLY ONE SENSOR TO THE BACK OF YOUR UPPER ARM. REPLACE EVERY 14 DAYS.    . fluticasone (FLONASE) 50 MCG/ACT nasal spray Place into the nose.    Marland Kitchen glucose blood test strip     . insulin regular human CONCENTRATED (HUMULIN R) 500 UNIT/ML injection Use up to 250 units (as calculated by U100 syringe) daily via insulin pump    . ketoconazole (NIZORAL) 2 % shampoo     . LORazepam (ATIVAN) 0.5 MG tablet Take by mouth. 1-2 every 6 hours as  needed    . metFORMIN (GLUCOPHAGE) 500 MG tablet Take 500 mg by mouth 2 (two) times daily.     . metoCLOPramide (REGLAN) 10 MG tablet Take 1 tablet (10 mg total) by mouth 4 (four) times daily as needed. 30 tablet 5  . montelukast (SINGULAIR) 10 MG tablet TAKE 1 TABLET(10 MG) BY MOUTH AT BEDTIME 30 tablet 5  . Multiple Vitamin (MULTIVITAMIN) tablet Take 1 tablet by mouth daily.    . Omega-3 Fatty Acids (FISH OIL) 1000 MG CAPS Take 1 capsule by mouth 2 (two) times daily.     Marland Kitchen OVER THE COUNTER MEDICATION Take 600 mg by mouth in the morning and at bedtime. Beet Root    . pantoprazole (PROTONIX) 40 MG tablet Take 1 tablet (40 mg total) by mouth daily. 90 tablet 4  . rosuvastatin (CRESTOR) 40 MG tablet Take 1 tablet (40 mg total) by mouth daily. 90 tablet 4  . sertraline (ZOLOFT) 50 MG tablet Take 1 tablet (50 mg total) by mouth daily. 90 tablet 4  . telmisartan (MICARDIS) 40 MG tablet Take 40 mg by mouth daily.    . vitamin E 180 MG (400 UNITS) capsule Take by mouth.    . Cyanocobalamin (VITAMIN B 12) 250 MCG LOZG Take by mouth. (Patient not taking: Reported on 12/12/2020)    . ondansetron (ZOFRAN ODT) 4 MG disintegrating tablet Take 1 tablet (4 mg total) by mouth every 8 (eight) hours as needed for nausea or vomiting. (Patient not taking:  Reported on 12/12/2020) 20 tablet 0  . tamsulosin (FLOMAX) 0.4 MG CAPS capsule Take 1 capsule (0.4 mg total) by mouth daily. (Patient not taking: No sig reported) 30 capsule 0  . Tiotropium Bromide Monohydrate (SPIRIVA RESPIMAT) 2.5 MCG/ACT AERS Inhale 2 puffs into the lungs daily. (Patient not taking: Reported on 12/12/2020) 4 g 11   No current facility-administered medications for this visit.    Patient confirms/reports the following allergies:  Allergies  Allergen Reactions  . Aleve  [Naproxen Sodium]   . Mobic  [Meloxicam]   . Voltaren  [Diclofenac Sodium]     No orders of the defined types were placed in this encounter.   AUTHORIZATION  INFORMATION Primary Insurance: 1D#: Group #:  Secondary Insurance: 1D#: Group #:  SCHEDULE INFORMATION: Date: 12/23/20 Time: Location:[facility]

## 2020-12-18 ENCOUNTER — Telehealth: Payer: Self-pay

## 2020-12-18 NOTE — Telephone Encounter (Signed)
Called to inform patient we received his blood thinner clearance back from Dr. Caryn Section office. LVM informing of this. Will send a my chart message to patient as well.

## 2020-12-20 DIAGNOSIS — M7541 Impingement syndrome of right shoulder: Secondary | ICD-10-CM | POA: Diagnosis not present

## 2020-12-20 DIAGNOSIS — M7542 Impingement syndrome of left shoulder: Secondary | ICD-10-CM | POA: Diagnosis not present

## 2020-12-23 ENCOUNTER — Ambulatory Visit: Payer: BC Managed Care – PPO | Admitting: Anesthesiology

## 2020-12-23 ENCOUNTER — Ambulatory Visit
Admission: RE | Admit: 2020-12-23 | Discharge: 2020-12-23 | Disposition: A | Discharge status: Home / Self Care | Payer: BC Managed Care – PPO | Source: Ambulatory Visit | Attending: Gastroenterology | Admitting: Gastroenterology

## 2020-12-23 ENCOUNTER — Encounter
Admission: RE | Disposition: A | Discharge status: Home / Self Care | Payer: Self-pay | Source: Ambulatory Visit | Attending: Gastroenterology

## 2020-12-23 ENCOUNTER — Encounter: Payer: Self-pay | Admitting: Gastroenterology

## 2020-12-23 DIAGNOSIS — K635 Polyp of colon: Secondary | ICD-10-CM

## 2020-12-23 DIAGNOSIS — Z888 Allergy status to other drugs, medicaments and biological substances status: Secondary | ICD-10-CM | POA: Diagnosis not present

## 2020-12-23 DIAGNOSIS — Z1211 Encounter for screening for malignant neoplasm of colon: Secondary | ICD-10-CM | POA: Diagnosis not present

## 2020-12-23 DIAGNOSIS — I252 Old myocardial infarction: Secondary | ICD-10-CM | POA: Diagnosis not present

## 2020-12-23 DIAGNOSIS — E119 Type 2 diabetes mellitus without complications: Secondary | ICD-10-CM | POA: Diagnosis not present

## 2020-12-23 DIAGNOSIS — I10 Essential (primary) hypertension: Secondary | ICD-10-CM | POA: Diagnosis not present

## 2020-12-23 DIAGNOSIS — Z833 Family history of diabetes mellitus: Secondary | ICD-10-CM | POA: Diagnosis not present

## 2020-12-23 DIAGNOSIS — Z7951 Long term (current) use of inhaled steroids: Secondary | ICD-10-CM | POA: Diagnosis not present

## 2020-12-23 DIAGNOSIS — Z79899 Other long term (current) drug therapy: Secondary | ICD-10-CM | POA: Diagnosis not present

## 2020-12-23 DIAGNOSIS — Z791 Long term (current) use of non-steroidal anti-inflammatories (NSAID): Secondary | ICD-10-CM | POA: Diagnosis not present

## 2020-12-23 DIAGNOSIS — Z7902 Long term (current) use of antithrombotics/antiplatelets: Secondary | ICD-10-CM | POA: Insufficient documentation

## 2020-12-23 DIAGNOSIS — D122 Benign neoplasm of ascending colon: Secondary | ICD-10-CM | POA: Insufficient documentation

## 2020-12-23 DIAGNOSIS — Z886 Allergy status to analgesic agent status: Secondary | ICD-10-CM | POA: Diagnosis not present

## 2020-12-23 DIAGNOSIS — K219 Gastro-esophageal reflux disease without esophagitis: Secondary | ICD-10-CM | POA: Diagnosis not present

## 2020-12-23 DIAGNOSIS — D125 Benign neoplasm of sigmoid colon: Secondary | ICD-10-CM | POA: Diagnosis not present

## 2020-12-23 DIAGNOSIS — Z8261 Family history of arthritis: Secondary | ICD-10-CM | POA: Insufficient documentation

## 2020-12-23 DIAGNOSIS — E785 Hyperlipidemia, unspecified: Secondary | ICD-10-CM | POA: Diagnosis not present

## 2020-12-23 DIAGNOSIS — Z87891 Personal history of nicotine dependence: Secondary | ICD-10-CM | POA: Insufficient documentation

## 2020-12-23 DIAGNOSIS — Z955 Presence of coronary angioplasty implant and graft: Secondary | ICD-10-CM | POA: Diagnosis not present

## 2020-12-23 DIAGNOSIS — Z8349 Family history of other endocrine, nutritional and metabolic diseases: Secondary | ICD-10-CM | POA: Diagnosis not present

## 2020-12-23 DIAGNOSIS — Z8249 Family history of ischemic heart disease and other diseases of the circulatory system: Secondary | ICD-10-CM | POA: Diagnosis not present

## 2020-12-23 HISTORY — PX: COLONOSCOPY WITH PROPOFOL: SHX5780

## 2020-12-23 LAB — GLUCOSE, CAPILLARY: Glucose-Capillary: 188 mg/dL — ABNORMAL HIGH (ref 70–99)

## 2020-12-23 SURGERY — COLONOSCOPY WITH PROPOFOL
Anesthesia: General

## 2020-12-23 MED ORDER — PROPOFOL 10 MG/ML IV BOLUS
INTRAVENOUS | Status: AC
  Filled 2020-12-23: qty 40

## 2020-12-23 MED ORDER — PROPOFOL 10 MG/ML IV BOLUS
INTRAVENOUS | Status: AC
  Filled 2020-12-23: qty 20

## 2020-12-23 MED ORDER — MIDAZOLAM HCL 2 MG/2ML IJ SOLN
INTRAMUSCULAR | Status: DC | PRN
  Administered 2020-12-23: 1 mg via INTRAVENOUS

## 2020-12-23 MED ORDER — PROPOFOL 10 MG/ML IV BOLUS
INTRAVENOUS | Status: DC | PRN
  Administered 2020-12-23: 100 mg via INTRAVENOUS

## 2020-12-23 MED ORDER — DEXMEDETOMIDINE (PRECEDEX) IN NS 20 MCG/5ML (4 MCG/ML) IV SYRINGE
PREFILLED_SYRINGE | INTRAVENOUS | Status: DC | PRN
  Administered 2020-12-23: 20 ug via INTRAVENOUS

## 2020-12-23 MED ORDER — PROPOFOL 500 MG/50ML IV EMUL
INTRAVENOUS | Status: DC | PRN
  Administered 2020-12-23: 150 ug/kg/min via INTRAVENOUS

## 2020-12-23 MED ORDER — DEXMEDETOMIDINE (PRECEDEX) IN NS 20 MCG/5ML (4 MCG/ML) IV SYRINGE
PREFILLED_SYRINGE | INTRAVENOUS | Status: AC
  Filled 2020-12-23: qty 5

## 2020-12-23 MED ORDER — MIDAZOLAM HCL 2 MG/2ML IJ SOLN
INTRAMUSCULAR | Status: AC
  Filled 2020-12-23: qty 2

## 2020-12-23 MED ORDER — SODIUM CHLORIDE 0.9 % IV SOLN
INTRAVENOUS | Status: DC
  Administered 2020-12-23: 20 mL/h via INTRAVENOUS

## 2020-12-23 NOTE — H&P (Signed)
Jonathon Bellows, MD [address], [place], [place], [place], [address] [address], [place], [place], [place], [address] Phone: [phone]  Fax: [phone]  Primary Care Physician:  Birdie Sons, MD   Pre-Procedure History & Physical: HPI:  Alex [name] is a 55 y.o. male is here for an colonoscopy.   Past Medical History:  Diagnosis Date  . Allergy   . Cirrhosis ([place])   . Diabetes mellitus without complication ([place])   . GERD (gastroesophageal reflux disease)   . History of chicken pox   . Myocardial infarction [facility])    08    Past Surgical History:  Procedure Laterality Date  . ESOPHAGOGASTRODUODENOSCOPY (EGD) WITH PROPOFOL N/A 05/01/2019   Procedure: ESOPHAGOGASTRODUODENOSCOPY (EGD) WITH PROPOFOL;  Surgeon: Jonathon Bellows, MD;  Location: [facility] ENDOSCOPY;  Service: Gastroenterology;  Laterality: N/A;  . heart artery stent  2008  . KNEE SURGERY    . Myocrasial Perfusion Scan  09/13/2011   Abnormal images. Not thought to be significant per Dr. Nehemiah Massed    Prior to Admission medications   Medication Sig Start Date End Date Taking? Authorizing Provider  alpha-1-proteinase inhibitor, human, (PROLASTIN-C) 1000 MG/20ML SOLN Inject 60 mg/kg into the vein once a week.   Yes [provider]  ascorbic acid (VITAMIN C) 500 MG tablet Take by mouth.   Yes [provider]  budesonide-formoterol (SYMBICORT) 160-4.5 MCG/ACT inhaler Inhale 2 puffs into the lungs 2 (two) times daily. 05/09/20  Yes Tyler Pita, MD  clopidogrel (PLAVIX) 75 MG tablet TAKE 1 TABLET BY MOUTH EVERY DAY 04/24/20  Yes Birdie Sons, MD  Coenzyme Q10 (COQ10) 200 MG CAPS Take 1 capsule by mouth 2 (two) times daily.   Yes [provider]  Continuous Blood Gluc Sensor (FREESTYLE LIBRE 14 DAY SENSOR) MISC APPLY ONE SENSOR TO THE BACK OF YOUR UPPER ARM. REPLACE EVERY 14 DAYS. 11/22/20  Yes [provider]  fluticasone (FLONASE) 50 MCG/ACT nasal spray Place into the nose.  08/31/13  Yes [provider]  glucose blood test strip  02/07/13  Yes [provider]  insulin regular human CONCENTRATED (HUMULIN R) 500 UNIT/ML injection Use up to 250 units (as calculated by U100 syringe) daily via insulin pump 02/16/17  Yes [provider]  ketoconazole (NIZORAL) 2 % shampoo  07/18/20  Yes [provider]  LORazepam (ATIVAN) 0.5 MG tablet Take by mouth. 1-2 every 6 hours as needed 07/13/13  Yes [provider]  metFORMIN (GLUCOPHAGE) 500 MG tablet Take 500 mg by mouth 2 (two) times daily.    Yes [provider]  metoCLOPramide (REGLAN) 10 MG tablet Take 1 tablet (10 mg total) by mouth 4 (four) times daily as needed. 07/06/17  Yes Birdie Sons, MD  montelukast (SINGULAIR) 10 MG tablet TAKE 1 TABLET(10 MG) BY MOUTH AT BEDTIME 11/13/19  Yes Birdie Sons, MD  Multiple Vitamin (MULTIVITAMIN) tablet Take 1 tablet by mouth daily.   Yes [provider]  Omega-3 Fatty Acids (FISH OIL) 1000 MG CAPS Take 1 capsule by mouth 2 (two) times daily.  12/05/08  Yes [provider]  ondansetron (ZOFRAN ODT) 4 MG disintegrating tablet Take 1 tablet (4 mg total) by mouth every 8 (eight) hours as needed for nausea or vomiting. 10/22/20  Yes Harvest Dark, MD  OVER THE COUNTER MEDICATION Take 600 mg by mouth in the morning and at bedtime. Beet Root   Yes [provider]  pantoprazole (PROTONIX) 40 MG tablet  Take 1 tablet (40 mg total) by mouth daily. 11/26/20  Yes Birdie Sons, MD  rosuvastatin (CRESTOR) 40 MG tablet Take 1 tablet (40 mg total) by mouth daily. 12/09/20 03/04/22 Yes Birdie Sons, MD  sertraline (ZOLOFT) 50 MG tablet Take 1 tablet (50 mg total) by mouth daily. 11/26/20  Yes Birdie Sons, MD  telmisartan (MICARDIS) 40 MG tablet Take 40 mg by mouth daily. 12/07/20  Yes [provider]  vitamin E 180 MG (400 UNITS) capsule Take by mouth.   Yes [provider]  Cyanocobalamin  (VITAMIN B 12) 250 MCG LOZG Take by mouth. Patient not taking: Reported on 12/12/2020    [provider]  tamsulosin (FLOMAX) 0.4 MG CAPS capsule Take 1 capsule (0.4 mg total) by mouth daily. Patient not taking: No sig reported 10/22/20   Harvest Dark, MD  Tiotropium Bromide Monohydrate (SPIRIVA RESPIMAT) 2.5 MCG/ACT AERS Inhale 2 puffs into the lungs daily. Patient not taking: Reported on 12/12/2020 10/17/19   Tyler Pita, MD    Allergies as of 12/12/2020 - Review Complete 11/26/2020  Allergen Reaction Noted  . Aleve  [naproxen sodium]  01/29/2015  . Mobic  [meloxicam]  01/29/2015  . Voltaren [diclofenac sodium] Nausea And Vomiting 01/29/2015    Family History  Problem Relation Age of Onset  . Hypertension Mother   . Diabetes Mother        insulin dependent  . Hyperlipidemia Mother   . Diabetes Other   . Hypertension Other   . Heart disease Other   . Arthritis Other     Social History   Socioeconomic History  . Marital status: Married    Spouse name: Not on file  . Number of children: Not on file  . Years of education: Not on file  . Highest education level: Not on file  Occupational History  . Occupation: Engineer, site  Tobacco Use  . Smoking status: Former Smoker    Packs/day: 1.50    Years: 20.00    Pack years: 30.00    Types: Cigarettes    Quit date: 08/10/2006    Years since quitting: 14.3  . Smokeless tobacco: Never Used  Substance and Sexual Activity  . Alcohol use: No    Alcohol/week: 0.0 standard drinks  . Drug use: No  . Sexual activity: Not on file  Other Topics Concern  . Not on file  Social History Narrative  . Not on file   Social Determinants of Health   Financial Resource Strain: Not on file  Food Insecurity: Not on file  Transportation Needs: Not on file  Physical Activity: Not on file  Stress: Not on file  Social Connections: Not on file  Intimate Partner Violence: Not on file    Review of Systems: See HPI,  otherwise negative ROS  Physical Exam: BP 123/80   Pulse 97   Temp (!) 96.5 F (35.8 C) (Temporal)   Resp 20   Ht 5\' 10"  (1.778 m)   Wt 108.9 kg   SpO2 96%   BMI 34.44 kg/m  General:   Alert,  pleasant and cooperative in NAD Head:  Normocephalic and atraumatic. Neck:  Supple; no masses or thyromegaly. Lungs:  Clear throughout to auscultation, normal respiratory effort.    Heart:  +S1, +S2, Regular rate and rhythm, No edema. Abdomen:  Soft, nontender and nondistended. Normal bowel sounds, without guarding, and without rebound.   Neurologic:  Alert and  oriented x4;  grossly normal neurologically.  Impression/Plan:  Alex [name] is here for an colonoscopy to be performed for Screening colonoscopy average risk   Risks, benefits, limitations, and alternatives regarding  colonoscopy have been reviewed with the patient.  Questions have been answered.  All parties agreeable.   Jonathon Bellows, MD  12/23/2020, 10:04 AM

## 2020-12-23 NOTE — Transfer of Care (Signed)
Immediate Anesthesia Transfer of Care Note  Patient: Alex [name]  Procedure(s) Performed: COLONOSCOPY WITH PROPOFOL (N/A )  Patient Location: PACU and Endoscopy Unit  Anesthesia Type:General  Level of Consciousness: drowsy and patient cooperative  Airway & Oxygen Therapy: Patient Spontanous Breathing  Post-op Assessment: Report given to RN and Post -op Vital signs reviewed and stable  Post vital signs: Reviewed and stable  Last Vitals:  Vitals Value Taken Time  BP 120/73 12/23/20 1039  Temp    Pulse 97 12/23/20 1040  Resp 18 12/23/20 1040  SpO2 93 % 12/23/20 1040  Vitals shown include unvalidated device data.  Last Pain:  Vitals:   12/23/20 1039  TempSrc:   PainSc: Asleep         Complications: No complications documented.

## 2020-12-23 NOTE — Anesthesia Preprocedure Evaluation (Signed)
Anesthesia Evaluation  Patient identified by MRN, date of birth, ID band Patient awake    Reviewed: Allergy & Precautions, H&P , NPO status , Patient's Chart, lab work & pertinent test results, reviewed documented beta blocker date and time   History of Anesthesia Complications Negative for: history of anesthetic complications  Airway Mallampati: II  TM Distance: >3 FB Neck ROM: full    Dental  (+) Dental Advidsory Given, Teeth Intact, Missing   Pulmonary neg shortness of breath, COPD (mild), neg recent URI, former smoker,    Pulmonary exam normal breath sounds clear to auscultation       Cardiovascular Exercise Tolerance: Good hypertension, (-) angina+ CAD, + Past MI and + Cardiac Stents  Normal cardiovascular exam(-) dysrhythmias (-) Valvular Problems/Murmurs Rhythm:regular Rate:Normal     Neuro/Psych PSYCHIATRIC DISORDERS Anxiety negative neurological ROS     GI/Hepatic GERD  ,(+) Cirrhosis       ,   Endo/Other  diabetes  Renal/GU Renal disease  negative genitourinary   Musculoskeletal   Abdominal   Peds  Hematology negative hematology ROS (+)   Anesthesia Other Findings Past Medical History: No date: Allergy No date: Cirrhosis ([place]) No date: Diabetes mellitus without complication ([facility]) No date: GERD (gastroesophageal reflux disease) No date: History of chicken pox No date: Myocardial infarction [facility])     Comment:  08   Reproductive/Obstetrics negative OB ROS                             Anesthesia Physical Anesthesia Plan  ASA: III  Anesthesia Plan: General   Post-op Pain Management:    Induction: Intravenous  PONV Risk Score and Plan: 2 and TIVA and Propofol infusion  Airway Management Planned: Natural Airway and Nasal Cannula  Additional Equipment:   Intra-op Plan:   Post-operative Plan:   Informed Consent: I have reviewed the patients History and Physical,  chart, labs and discussed the procedure including the risks, benefits and alternatives for the proposed anesthesia with the patient or authorized representative who has indicated his/her understanding and acceptance.     Dental Advisory Given  Plan Discussed with: Anesthesiologist, CRNA and Surgeon  Anesthesia Plan Comments:         Anesthesia Quick Evaluation

## 2020-12-23 NOTE — Op Note (Signed)
[facility] Gastroenterology Patient Name: Alex [name] Procedure Date: 12/23/2020 10:11 AM MRN: [id number] Account #: [id number] Date of Birth: 1965/11/16 Admit Type: Outpatient Age: 55 Room: [facility] ENDO ROOM 4 Gender: Male Note Status: Finalized Procedure:             Colonoscopy Indications:           Screening for colorectal malignant neoplasm Providers:             Jonathon Bellows MD, MD Referring MD:          Kirstie Peri. Caryn Section, MD (Referring MD) Medicines:             Monitored Anesthesia Care Complications:         No immediate complications. Procedure:             Pre-Anesthesia Assessment:                        - Prior to the procedure, a History and Physical was                         performed, and patient medications, allergies and                         sensitivities were reviewed. The patient's tolerance                         of previous anesthesia was reviewed.                        - The risks and benefits of the procedure and the                         sedation options and risks were discussed with the                         patient. All questions were answered and informed                         consent was obtained.                        - Prior to the procedure, a History and Physical was                         performed, and patient medications, allergies and                         sensitivities were reviewed. The patient's tolerance                         of previous anesthesia was reviewed.                        - The risks and benefits of the procedure and the                         sedation options and risks were discussed with the  patient. All questions were answered and informed                         consent was obtained.                        - ASA Grade Assessment: II - A patient with mild                         systemic disease.                        After obtaining informed consent, the colonoscope  was                         passed under direct vision. Throughout the procedure,                         the patient's blood pressure, pulse, and oxygen                         saturations were monitored continuously. The                         Colonoscope was introduced through the anus and                         advanced to the the cecum, identified by the                         appendiceal orifice. The colonoscopy was performed                         with ease. The patient tolerated the procedure well.                         The quality of the bowel preparation was poor. Findings:      The perianal and digital rectal examinations were normal.      Two sessile polyps were found in the sigmoid colon and ascending colon.       The polyps were 7 to 9 mm in size. These polyps were removed with a cold       snare. Resection and retrieval were complete.      The exam was otherwise without abnormality on direct and retroflexion       views. Impression:            - Preparation of the colon was poor.                        - Two 7 to 9 mm polyps in the sigmoid colon and in the                         ascending colon, removed with a cold snare. Resected                         and retrieved.                        - The examination was  otherwise normal on direct and                         retroflexion views. Recommendation:        - Discharge patient to home (with escort).                        - Resume previous diet.                        - Continue present medications.                        - Await pathology results.                        - Repeat colonoscopy in 6 months because the bowel                         preparation was suboptimal. Procedure Code(s):     --- Professional ---                        [phone], Colonoscopy, flexible; with removal of                         tumor(s), polyp(s), or other lesion(s) by snare                         technique Diagnosis Code(s):      --- Professional ---                        Z12.11, Encounter for screening for malignant neoplasm                         of colon                        K63.5, Polyp of colon CPT copyright 2019 American Medical Association. All rights reserved. The codes documented in this report are preliminary and upon coder review may  be revised to meet current compliance requirements. Jonathon Bellows, MD Jonathon Bellows MD, MD 12/23/2020 10:36:25 AM This report has been signed electronically. Number of Addenda: 0 Note Initiated On: 12/23/2020 10:11 AM Scope Withdrawal Time: 0 hours 11 minutes 6 seconds  Total Procedure Duration: 0 hours 17 minutes 51 seconds  Estimated Blood Loss:  Estimated blood loss: none.      [facility]

## 2020-12-23 NOTE — Anesthesia Postprocedure Evaluation (Signed)
Anesthesia Post Note  Patient: RANALD ALESSIO  Procedure(s) Performed: COLONOSCOPY WITH PROPOFOL (N/A )  Patient location during evaluation: Endoscopy Anesthesia Type: General Level of consciousness: awake and alert Pain management: pain level controlled Vital Signs Assessment: post-procedure vital signs reviewed and stable Respiratory status: spontaneous breathing, nonlabored ventilation, respiratory function stable and patient connected to nasal cannula oxygen Cardiovascular status: blood pressure returned to baseline and stable Postop Assessment: no apparent nausea or vomiting Anesthetic complications: no   No complications documented.   Last Vitals:  Vitals:   12/23/20 1049 12/23/20 1059  BP: (!) 107/58 106/72  Pulse: 86 85  Resp: 14 15  Temp:    SpO2: 96% 94%    Last Pain:  Vitals:   12/23/20 1059  TempSrc:   PainSc: 0-No pain                 Martha Clan

## 2020-12-24 ENCOUNTER — Encounter: Payer: Self-pay | Admitting: Gastroenterology

## 2020-12-24 ENCOUNTER — Encounter: Payer: Self-pay | Admitting: Family Medicine

## 2020-12-24 DIAGNOSIS — Z86010 Personal history of colonic polyps: Secondary | ICD-10-CM | POA: Insufficient documentation

## 2020-12-24 LAB — SURGICAL PATHOLOGY

## 2021-01-28 DIAGNOSIS — X32XXXA Exposure to sunlight, initial encounter: Secondary | ICD-10-CM | POA: Diagnosis not present

## 2021-01-28 DIAGNOSIS — D2271 Melanocytic nevi of right lower limb, including hip: Secondary | ICD-10-CM | POA: Diagnosis not present

## 2021-01-28 DIAGNOSIS — D2262 Melanocytic nevi of left upper limb, including shoulder: Secondary | ICD-10-CM | POA: Diagnosis not present

## 2021-01-28 DIAGNOSIS — Z85828 Personal history of other malignant neoplasm of skin: Secondary | ICD-10-CM | POA: Diagnosis not present

## 2021-01-28 DIAGNOSIS — D2261 Melanocytic nevi of right upper limb, including shoulder: Secondary | ICD-10-CM | POA: Diagnosis not present

## 2021-01-28 DIAGNOSIS — L570 Actinic keratosis: Secondary | ICD-10-CM | POA: Diagnosis not present

## 2021-02-12 DIAGNOSIS — E119 Type 2 diabetes mellitus without complications: Secondary | ICD-10-CM | POA: Diagnosis not present

## 2021-02-19 DIAGNOSIS — E1165 Type 2 diabetes mellitus with hyperglycemia: Secondary | ICD-10-CM | POA: Diagnosis not present

## 2021-02-19 DIAGNOSIS — E1159 Type 2 diabetes mellitus with other circulatory complications: Secondary | ICD-10-CM | POA: Diagnosis not present

## 2021-02-19 DIAGNOSIS — E1129 Type 2 diabetes mellitus with other diabetic kidney complication: Secondary | ICD-10-CM | POA: Diagnosis not present

## 2021-02-19 DIAGNOSIS — E113293 Type 2 diabetes mellitus with mild nonproliferative diabetic retinopathy without macular edema, bilateral: Secondary | ICD-10-CM | POA: Diagnosis not present

## 2021-03-05 ENCOUNTER — Other Ambulatory Visit: Payer: Self-pay | Admitting: Family Medicine

## 2021-03-05 NOTE — Telephone Encounter (Signed)
Requested Prescriptions  Pending Prescriptions Disp Refills  . clopidogrel (PLAVIX) 75 MG tablet [Pharmacy Med Name: CLOPIDOGREL 75 MG TAB[*]] 90 tablet 0    Sig: TAKE ONE TABLET BY MOUTH ONE TIME DAILY     Hematology: Antiplatelets - clopidogrel Failed - 03/05/2021  7:01 PM      Failed - Evaluate AST, ALT within 2 months of therapy initiation.      Passed - ALT in normal range and within 360 days    ALT  Date Value Ref Range Status  11/26/2020 42 0 - 44 IU/L Final         Passed - AST in normal range and within 360 days    AST  Date Value Ref Range Status  11/26/2020 35 0 - 40 IU/L Final         Passed - HCT in normal range and within 180 days    HCT  Date Value Ref Range Status  10/22/2020 42.8 39.0 - 52.0 % Final         Passed - HGB in normal range and within 180 days    Hemoglobin  Date Value Ref Range Status  10/22/2020 15.2 13.0 - 17.0 g/dL Final         Passed - PLT in normal range and within 180 days    Platelets  Date Value Ref Range Status  10/22/2020 235 150 - 400 K/uL Final         Passed - Valid encounter within last 6 months    Recent Outpatient Visits          3 months ago Panic disorder   [facility] Birdie Sons, MD   1 year ago Essential (primary) hypertension   [facility] Birdie Sons, MD   2 years ago [place], Donald E, MD   2 years ago Need for shingles vaccine   [facility] Birdie Sons, MD   2 years ago Pelvic pain   [facility] Birdie Sons, MD

## 2021-03-26 ENCOUNTER — Other Ambulatory Visit: Payer: Self-pay | Admitting: Family Medicine

## 2021-03-26 NOTE — Telephone Encounter (Signed)
Requested medications are due for refill today.  Seems a bit too soon  Requested medications are on the active medications list.  yes  Last refill. 03/05/2021  Future visit scheduled.   no  Notes to clinic.  Patient has not had labs for AST and ALT in over 1 year.

## 2021-05-19 DIAGNOSIS — E119 Type 2 diabetes mellitus without complications: Secondary | ICD-10-CM | POA: Diagnosis not present

## 2021-05-20 DIAGNOSIS — E119 Type 2 diabetes mellitus without complications: Secondary | ICD-10-CM | POA: Diagnosis not present

## 2021-05-23 DIAGNOSIS — E1165 Type 2 diabetes mellitus with hyperglycemia: Secondary | ICD-10-CM | POA: Diagnosis not present

## 2021-05-23 LAB — LIPID PANEL
Cholesterol: 136 (ref 0–200)
HDL: 36 (ref 35–70)
LDL Cholesterol: 71
Triglycerides: 148 (ref 40–160)

## 2021-05-23 LAB — BASIC METABOLIC PANEL
BUN: 11 (ref 4–21)
Creatinine: 1.1 (ref 0.6–1.3)
Potassium: 4.4 (ref 3.4–5.3)
Sodium: 145 (ref 137–147)

## 2021-05-23 LAB — MICROALBUMIN, URINE: Microalb, Ur: 29

## 2021-05-23 LAB — MICROALBUMIN / CREATININE URINE RATIO: Microalb Creat Ratio: 39

## 2021-05-23 LAB — HEMOGLOBIN A1C: Hemoglobin A1C: 7.6

## 2021-05-23 LAB — COMPREHENSIVE METABOLIC PANEL: GFR calc non Af Amer: 70

## 2021-05-30 DIAGNOSIS — E1129 Type 2 diabetes mellitus with other diabetic kidney complication: Secondary | ICD-10-CM | POA: Diagnosis not present

## 2021-05-30 DIAGNOSIS — E1159 Type 2 diabetes mellitus with other circulatory complications: Secondary | ICD-10-CM | POA: Diagnosis not present

## 2021-05-30 DIAGNOSIS — Z9641 Presence of insulin pump (external) (internal): Secondary | ICD-10-CM | POA: Diagnosis not present

## 2021-05-30 DIAGNOSIS — E113293 Type 2 diabetes mellitus with mild nonproliferative diabetic retinopathy without macular edema, bilateral: Secondary | ICD-10-CM | POA: Diagnosis not present

## 2021-06-04 DIAGNOSIS — I6523 Occlusion and stenosis of bilateral carotid arteries: Secondary | ICD-10-CM | POA: Diagnosis not present

## 2021-06-04 DIAGNOSIS — E782 Mixed hyperlipidemia: Secondary | ICD-10-CM | POA: Diagnosis not present

## 2021-06-04 DIAGNOSIS — I2510 Atherosclerotic heart disease of native coronary artery without angina pectoris: Secondary | ICD-10-CM | POA: Diagnosis not present

## 2021-06-04 DIAGNOSIS — I10 Essential (primary) hypertension: Secondary | ICD-10-CM | POA: Diagnosis not present

## 2021-06-11 ENCOUNTER — Telehealth: Payer: Self-pay

## 2021-06-11 ENCOUNTER — Telehealth: Payer: Self-pay | Admitting: Pulmonary Disease

## 2021-06-11 NOTE — Telephone Encounter (Signed)
Lm for Eversana.

## 2021-06-11 NOTE — Telephone Encounter (Signed)
Received enrollment form from [facility] for Prolastin.  Dr. Patsey Berthold signed forms. Form faxed back.  Patient is past due for ROV. Contacted patient and scheduled OV 07/17/2021 at 10:00. Nothing further needed at this time.

## 2021-06-12 NOTE — Telephone Encounter (Signed)
Lm x2 for Eversana.  Will call once more due to nature of call.

## 2021-06-12 NOTE — Telephone Encounter (Signed)
Spoke to [employer] with [employer].  I have requested that she refax enrollment form.

## 2021-06-13 NOTE — Telephone Encounter (Signed)
Forms have been received and placed in Dr. Domingo Dimes folder for signature.

## 2021-06-26 NOTE — Telephone Encounter (Signed)
Still awaiting signature. [employer] has been out of the office.

## 2021-06-27 NOTE — Telephone Encounter (Signed)
Forms completed and faxed back to [facility].

## 2021-06-30 ENCOUNTER — Telehealth: Payer: Self-pay | Admitting: Pulmonary Disease

## 2021-06-30 NOTE — Telephone Encounter (Signed)
It is okay to give verbal order for Prolastin.  The fax copies (multiple) that they sent was very poor quality.

## 2021-06-30 NOTE — Telephone Encounter (Signed)
Dr. Patsey Berthold, please advise if okay to give verbal order for prolastin?

## 2021-06-30 NOTE — Telephone Encounter (Signed)
Spoke to [employer] with [facility] and gave verbal for prolastin.  Nothing further needed.

## 2021-07-17 ENCOUNTER — Other Ambulatory Visit: Payer: Self-pay

## 2021-07-17 ENCOUNTER — Ambulatory Visit (INDEPENDENT_AMBULATORY_CARE_PROVIDER_SITE_OTHER): Payer: BC Managed Care – PPO | Admitting: Pulmonary Disease

## 2021-07-17 ENCOUNTER — Encounter: Payer: Self-pay | Admitting: Pulmonary Disease

## 2021-07-17 VITALS — BP 138/80 | HR 80 | Temp 98.1°F | Ht 70.0 in | Wt 240.0 lb

## 2021-07-17 DIAGNOSIS — K7469 Other cirrhosis of liver: Secondary | ICD-10-CM

## 2021-07-17 DIAGNOSIS — J449 Chronic obstructive pulmonary disease, unspecified: Secondary | ICD-10-CM | POA: Diagnosis not present

## 2021-07-17 DIAGNOSIS — E8801 Alpha-1-antitrypsin deficiency: Secondary | ICD-10-CM

## 2021-07-17 NOTE — Progress Notes (Signed)
Subjective:    Patient ID: Alex [name], male    DOB: 1965/09/04, 55 y.o.   MRN: [id number] Chief Complaint  Patient presents with   Follow-up    No current sx.    HPI The patient is a 55 year old former smoker (78 PY) who presents for follow-up on the issue of alpha-1 antitrypsin deficiency with MZ phenotype.  He is currently on Prolastin infusion weekly.  We last saw him on 17 October 2019 and had gotten lost to follow-up.  This is a scheduled visit.  He has been doing very well without monthly exacerbations of asthma/COPD overlap since on Prolastin.  He also notes increased stamina since he has been on Prolastin.  He discontinued use of Symbicort and Spiriva on his own.  He does get some dyspnea on exertion.  Occasional wheezing but not like prior.  No cough, sputum production or hemoptysis.  No fevers, chills or sweats.  No orthopnea or paroxysmal nocturnal dyspnea.  No lower extremity edema or calf tenderness.  No chest pain.  Overall he feels well and looks well.  Review of Systems A 10 point review of systems was performed and it is as noted above otherwise negative.  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   Type 2 diabetes mellitus with diabetic chronic kidney disease ([place]) 12/05/2008   Essential (primary) hypertension 12/05/2008   GERD (gastroesophageal reflux disease) 12/05/2008   Panic disorder 08/11/2007   Old myocardial infarct 02/08/2007   HLD (hyperlipidemia) 03/24/2006    Abnormal LFTs 08/10/2005   CAD (coronary artery disease) 08/11/2003   Social History   Tobacco Use   Smoking status: Former    Packs/day: 1.50    Years: 20.00    Pack years: 30.00    Types: Cigarettes    Quit date: 08/10/2006    Years since quitting: 14.9   Smokeless tobacco: Never  Substance Use Topics   Alcohol use: No    Alcohol/week: 0.0 standard drinks   Allergies  Allergen Reactions   Aleve  [Naproxen Sodium]    Mobic  [Meloxicam]    Voltaren [Diclofenac Sodium] Nausea And Vomiting   Current Meds  Medication Sig   alpha-1-proteinase inhibitor, human, (PROLASTIN-C) 1000 MG/20ML SOLN Inject 60 mg/kg into the vein once a week.   ascorbic acid (VITAMIN C) 500 MG tablet Take by mouth.   clopidogrel (PLAVIX) 75 MG tablet TAKE ONE TABLET BY MOUTH ONE TIME DAILY   Coenzyme Q10 (COQ10) 200 MG CAPS Take 1 capsule by mouth 2 (two) times daily.   Continuous Blood Gluc Sensor (FREESTYLE LIBRE 14 DAY SENSOR) MISC APPLY ONE SENSOR TO THE BACK OF YOUR UPPER ARM. REPLACE EVERY 14 DAYS.   Cyanocobalamin (VITAMIN B 12) 250 MCG LOZG Take by mouth.   fluticasone (FLONASE) 50 MCG/ACT nasal spray Place into the nose.   glucose blood test strip    insulin regular human CONCENTRATED (HUMULIN R) 500 UNIT/ML injection Use up to 250 units (as calculated by U100 syringe) daily via insulin pump  ketoconazole (NIZORAL) 2 % shampoo    LORazepam (ATIVAN) 0.5 MG tablet Take by mouth. 1-2 every 6 hours as needed   metFORMIN (GLUCOPHAGE) 500 MG tablet Take 500 mg by mouth 2 (two) times daily.    metoCLOPramide (REGLAN) 10 MG tablet Take 1 tablet (10 mg total) by mouth 4 (four) times daily as needed.   montelukast (SINGULAIR) 10 MG tablet TAKE 1 TABLET(10 MG) BY MOUTH AT BEDTIME   Multiple Vitamin (MULTIVITAMIN) tablet Take 1 tablet by mouth daily.   Omega-3 Fatty Acids (FISH OIL) 1000 MG CAPS Take 1 capsule by mouth 2 (two) times daily.    ondansetron (ZOFRAN ODT) 4 MG disintegrating tablet Take 1 tablet  (4 mg total) by mouth every 8 (eight) hours as needed for nausea or vomiting.   OVER THE COUNTER MEDICATION Take 600 mg by mouth in the morning and at bedtime. Beet Root   pantoprazole (PROTONIX) 40 MG tablet Take 1 tablet (40 mg total) by mouth daily.   rosuvastatin (CRESTOR) 40 MG tablet Take 1 tablet (40 mg total) by mouth daily.   sertraline (ZOLOFT) 50 MG tablet Take 1 tablet (50 mg total) by mouth daily.   tamsulosin (FLOMAX) 0.4 MG CAPS capsule Take 1 capsule (0.4 mg total) by mouth daily.   telmisartan (MICARDIS) 40 MG tablet Take 40 mg by mouth daily.   vitamin E 180 MG (400 UNITS) capsule Take by mouth.   Immunization History  Administered Date(s) Administered   Hep A / Hep B 04/06/2019, 05/04/2019   Influenza,inj,Quad PF,6+ Mos 04/20/2018, 04/10/2019   Influenza-Unspecified 04/28/2017   Pneumococcal Polysaccharide-23 07/23/2011   Tdap 07/23/2011   Zoster Recombinat (Shingrix) 04/20/2018, 06/22/2018      Objective:   Physical Exam BP 138/80 (BP Location: Left Arm, Cuff Size: Normal)   Pulse 80   Temp 98.1 F (36.7 C) (Temporal)   Ht 5\' 10"  (1.778 m)   Wt 240 lb (108.9 kg)   SpO2 98%   BMI 34.44 kg/m  GENERAL: Overweight gentleman, fully ambulatory, no distress, no conversational dyspnea.   HEAD: Normocephalic, atraumatic.  EYES: Pupils equal, round, reactive to light.  No scleral icterus.  MOUTH: Nose/mouth/throat not examined due to masking requirements for COVID 19. NECK: Supple. No thyromegaly. No nodules. No JVD.  Trachea midline, phonation normal. PULMONARY: Lungs with coarse breath sounds throughout.  Mild end expiratory wheezing noted. CARDIOVASCULAR: S1 and S2. Regular rate and rhythm.  Grade 1/6 to 2/6 systolic ejection murmur consistent with mitral regurgitation. GASTROINTESTINAL: Benign. MUSCULOSKELETAL: No joint swelling, no clubbing, no edema.  NEUROLOGIC: Awake alert, no focal deficits. SKIN: Intact,warm,dry.  No rashes noted on limited exam. PSYCH:  Mood and behavior normal.    Assessment & Plan:     ICD-10-CM   1. AAT (alpha-1-antitrypsin) deficiency ([facility])  E88.01    Continue Prolastin infusions Follow-up 6 months    2. Asthma-COPD overlap syndrome ([place])  J44.9    Needs to resume Symbicort 160/4.52 puffs twice a day Continue as needed albuterol    3. Other cirrhosis of liver ([place])  K74.69    Needs follow-up with Dr. Vicente Males Patient to make appointment     Patient is to continue Prolastin.  He is doing very well without exacerbations since starting on this medication.  He needs also follow-up with GI with regards to his cirrhosis of the liver in the setting of alpha-1 deficiency.  He was noted to have mild bronchospasm today he was reminded he does have an asthmatic component and  needs to continue using Symbicort.  He should not stop respiratory medications on his own without consulting with a physician.  We will see him in follow-up in 6 months time he is to contact us prior to that time should any new problems arise.   Renold Don, MD Advanced Bronchoscopy PCCM [facility]    *This note was dictated using voice recognition software/Dragon.  Despite best efforts to proofread, errors can occur which can change the meaning.  Any change was purely unintentional.

## 2021-07-17 NOTE — Patient Instructions (Signed)
We will continue the alpha infusions.  I recommend you restart the Symbicort 160/4.5, 2 inhalations twice a day.  Make sure you rinse your mouth well after you use it.  We will see him in follow-up in 6 months time call sooner should any new problems arise.

## 2021-07-29 ENCOUNTER — Other Ambulatory Visit: Payer: Self-pay

## 2021-07-29 ENCOUNTER — Encounter: Payer: Self-pay | Admitting: Physician Assistant

## 2021-07-29 ENCOUNTER — Ambulatory Visit (INDEPENDENT_AMBULATORY_CARE_PROVIDER_SITE_OTHER): Payer: BC Managed Care – PPO | Admitting: Physician Assistant

## 2021-07-29 VITALS — BP 138/72 | HR 92 | Temp 97.5°F | Ht 70.0 in | Wt 243.2 lb

## 2021-07-29 DIAGNOSIS — S29012A Strain of muscle and tendon of back wall of thorax, initial encounter: Secondary | ICD-10-CM

## 2021-07-29 MED ORDER — METHYLPREDNISOLONE 4 MG PO TBPK: ORAL_TABLET | ORAL | 0 refills | Status: DC

## 2021-07-29 MED ORDER — IBUPROFEN 800 MG PO TABS
800.0000 mg | ORAL_TABLET | Freq: Three times a day (TID) | ORAL | 0 refills | Status: DC | PRN
Start: 2021-07-29 — End: 2022-09-03

## 2021-07-29 MED ORDER — CYCLOBENZAPRINE HCL 5 MG PO TABS: 5.0000 mg | ORAL_TABLET | Freq: Every day | ORAL | 0 refills | Status: DC

## 2021-07-29 NOTE — Progress Notes (Signed)
Established patient visit   Patient: Alex [name]   DOB: 1965/12/25   55 y.o. Male  MRN: [id number] Visit Date: 07/29/2021  Today's healthcare provider: Mikey Kirschner, PA-C   Cc. Left sided upper back pain x 3-4 weeks.   Subjective    HPI  Alex [name] is a 55 y/o male who presents today with concerns of mid back/upper back pain that started 2-3 weeks ago, after a day of cutting down 31 trees and moving heavy objects. He feels it radiates across the side/front of his body. Described the pain as sometimes sharp, sometimes burning/aching. Reports some relief with 800 mg of advil and a heating pad. Denies numbness, denies lower back or flank pain, denies urinary frequency, dysuria. Some increase in pain with ROM of right shoulder Medications: Outpatient Medications Prior to Visit  Medication Sig   alpha-1-proteinase inhibitor, human, (PROLASTIN-C) 1000 MG/20ML SOLN Inject 60 mg/kg into the vein once a week.   ascorbic acid (VITAMIN C) 500 MG tablet Take by mouth.   budesonide-formoterol (SYMBICORT) 160-4.5 MCG/ACT inhaler Inhale 2 puffs into the lungs 2 (two) times daily.   clopidogrel (PLAVIX) 75 MG tablet TAKE ONE TABLET BY MOUTH ONE TIME DAILY   Coenzyme Q10 (COQ10) 200 MG CAPS Take 1 capsule by mouth 2 (two) times daily.   Continuous Blood Gluc Sensor (FREESTYLE LIBRE 14 DAY SENSOR) MISC APPLY ONE SENSOR TO THE BACK OF YOUR UPPER ARM. REPLACE EVERY 14 DAYS.   Cyanocobalamin (VITAMIN B 12) 250 MCG LOZG Take by mouth.   fluticasone (FLONASE) 50 MCG/ACT nasal spray Place into the nose.   insulin regular human CONCENTRATED (HUMULIN R) 500 UNIT/ML injection Use up to 250 units (as calculated by U100 syringe) daily via insulin pump   ketoconazole (NIZORAL) 2 % shampoo    LORazepam (ATIVAN) 0.5 MG tablet Take by mouth. 1-2 every 6 hours as needed   metFORMIN (GLUCOPHAGE) 500 MG tablet Take 500 mg by mouth 2 (two) times daily.    Multiple Vitamin (MULTIVITAMIN) tablet Take 1 tablet by mouth  daily.   Omega-3 Fatty Acids (FISH OIL) 1000 MG CAPS Take 1 capsule by mouth 2 (two) times daily.    OVER THE COUNTER MEDICATION Take 600 mg by mouth in the morning and at bedtime. Beet Root   pantoprazole (PROTONIX) 40 MG tablet Take 1 tablet (40 mg total) by mouth daily.   rosuvastatin (CRESTOR) 40 MG tablet Take 1 tablet (40 mg total) by mouth daily.   sertraline (ZOLOFT) 50 MG tablet Take 1 tablet (50 mg total) by mouth daily.   tamsulosin (FLOMAX) 0.4 MG CAPS capsule Take 1 capsule (0.4 mg total) by mouth daily.   telmisartan (MICARDIS) 40 MG tablet Take 40 mg by mouth daily.   [DISCONTINUED] glucose blood test strip    [DISCONTINUED] metoCLOPramide (REGLAN) 10 MG tablet Take 1 tablet (10 mg total) by mouth 4 (four) times daily as needed.   [DISCONTINUED] montelukast (SINGULAIR) 10 MG tablet TAKE 1 TABLET(10 MG) BY MOUTH AT BEDTIME   [DISCONTINUED] ondansetron (ZOFRAN ODT) 4 MG disintegrating tablet Take 1 tablet (4 mg total) by mouth every 8 (eight) hours as needed for nausea or vomiting.   [DISCONTINUED] Tiotropium Bromide Monohydrate (SPIRIVA RESPIMAT) 2.5 MCG/ACT AERS Inhale 2 puffs into the lungs daily.   [DISCONTINUED] vitamin E 180 MG (400 UNITS) capsule Take by mouth.   No facility-administered medications prior to visit.    Review of Systems  Constitutional:  Negative for fatigue and fever.  Respiratory:  Negative for shortness of breath.   Cardiovascular:  Negative for chest pain.  Musculoskeletal:  Positive for back pain.   Last CBC Lab Results  Component Value Date   WBC 12.5 (H) 10/22/2020   HGB 15.2 10/22/2020   HCT 42.8 10/22/2020   MCV 86.3 10/22/2020   MCH 30.6 10/22/2020   RDW 13.0 10/22/2020   PLT 235 67/34/1937   Last metabolic panel Lab Results  Component Value Date   GLUCOSE 168 (H) 11/26/2020   NA 139 11/26/2020   K 4.7 11/26/2020   CL 100 11/26/2020   CO2 22 11/26/2020   BUN 17 11/26/2020   CREATININE 1.16 11/26/2020   EGFR 75 11/26/2020    CALCIUM 10.3 (H) 11/26/2020   PROT 7.4 11/26/2020   ALBUMIN 4.9 11/26/2020   LABGLOB 2.5 11/26/2020   AGRATIO 2.0 11/26/2020   BILITOT 0.5 11/26/2020   ALKPHOS 96 11/26/2020   AST 35 11/26/2020   ALT 42 11/26/2020   ANIONGAP 9 10/22/2020   Last lipids Lab Results  Component Value Date   CHOL 148 09/04/2020   HDL 36 09/04/2020   LDLCALC 76 09/04/2020   TRIG 184 (A) 09/04/2020   Last hemoglobin A1c Lab Results  Component Value Date   HGBA1C 8.2 09/04/2020     Objective    BP 138/72 (BP Location: Left Arm, Patient Position: Sitting, Cuff Size: Large)    Pulse 92    Temp (!) 97.5 F (36.4 C) (Oral)    Ht 5' 10"  (1.778 m)    Wt 243 lb 3.2 oz (110.3 kg)    SpO2 98%    BMI 34.90 kg/m  BP Readings from Last 3 Encounters:  07/29/21 138/72  07/17/21 138/80  12/23/20 106/72   Wt Readings from Last 3 Encounters:  07/29/21 243 lb 3.2 oz (110.3 kg)  07/17/21 240 lb (108.9 kg)  12/23/20 240 lb (108.9 kg)      Physical Exam Constitutional:      General: He is awake.     Appearance: He is well-developed.  HENT:     Head: Normocephalic.  Eyes:     Conjunctiva/sclera: Conjunctivae normal.  Cardiovascular:     Rate and Rhythm: Normal rate and regular rhythm.     Heart sounds: Normal heart sounds.  Pulmonary:     Effort: Pulmonary effort is normal.     Breath sounds: Normal breath sounds.  Musculoskeletal:     Thoracic back: Spasms present. No swelling.     Comments: Tenderness to L paraspinal muscles thoracic spine, underneath L scapula. Some pain with Right shoulder ROM  Skin:    General: Skin is warm.  Neurological:     Mental Status: He is alert and oriented to person, place, and time.  Psychiatric:        Attention and Perception: Attention normal.        Mood and Affect: Mood normal.        Speech: Speech normal.        Behavior: Behavior is cooperative.     No results found for any visits on 07/29/21.  Assessment & Plan     Upper back strain Advised  heat/ice, rx 800 mg ibuprofen q 8 hr PRN Rx medroldose pack, pt will watch sugars.  Rx flexeril, 5 mg before bed. Advised if pain is not improved to call office.  Return if symptoms worsen or fail to improve.      I, Mikey Kirschner, PA-C have reviewed all documentation for this  visit. The documentation on  07/29/2021 for the exam, diagnosis, procedures, and orders are all accurate and complete.    Mikey Kirschner, PA-C  [facility] [phone] (phone) [phone] (fax)  [place]

## 2021-08-18 ENCOUNTER — Other Ambulatory Visit: Payer: Self-pay | Admitting: Family Medicine

## 2021-09-10 ENCOUNTER — Encounter: Payer: Self-pay | Admitting: Physician Assistant

## 2021-09-10 ENCOUNTER — Ambulatory Visit: Payer: Self-pay

## 2021-09-10 ENCOUNTER — Telehealth: Payer: BC Managed Care – PPO | Admitting: Physician Assistant

## 2021-09-10 DIAGNOSIS — J019 Acute sinusitis, unspecified: Secondary | ICD-10-CM

## 2021-09-10 DIAGNOSIS — B9689 Other specified bacterial agents as the cause of diseases classified elsewhere: Secondary | ICD-10-CM

## 2021-09-10 MED ORDER — DOXYCYCLINE HYCLATE 100 MG PO TABS: 100.0000 mg | ORAL_TABLET | Freq: Two times a day (BID) | ORAL | 0 refills | Status: DC

## 2021-09-10 NOTE — Telephone Encounter (Signed)
°  Chief Complaint: sinus infection Symptoms: head pressure and pain, coughing, drainage Frequency: 3-4 days Pertinent Negatives: Patient denies fever or SOB Disposition: [] ED /[] Urgent Care (no appt availability in office) / [] Appointment(In office/virtual)/ [x]  [facility]/ [] Home Care/ [] Refused Recommended Disposition /[] [facility]/ []  Follow-up with PCP Additional Notes: first available appt with office was 09/12/21 for virtual. Pt preferred to be seen sooner so assisted with setting up virtual appt with pt and his wife. Pt was confirmed.    Pt called in stating he may have a sinus infection, having some congestion, cough, runny nose, there were no available appt within the next few days. Please advise.   Reason for Disposition  Lots of coughing  Answer Assessment - Initial Assessment Questions 1. LOCATION: "Where does it hurt?"      Head  2. ONSET: "When did the sinus pain start?"  (e.g., hours, days)      3 days  3. SEVERITY: "How bad is the pain?"   (Scale 1-10; mild, moderate or severe)   - MILD (1-3): doesn't interfere with normal activities    - MODERATE (4-7): interferes with normal activities (e.g., work or school) or awakens from sleep   - SEVERE (8-10): excruciating pain and patient unable to do any normal activities        5 discomfort  4. RECURRENT SYMPTOM: "Have you ever had sinus problems before?" If Yes, ask: "When was the last time?" and "What happened that time?"      Years ago 5. NASAL CONGESTION: "Is the nose blocked?" If Yes, ask: "Can you open it or must you breathe through your mouth?"     yes 6. NASAL DISCHARGE: "Do you have discharge from your nose?" If so ask, "What color?"     Brownish  7. FEVER: "Do you have a fever?" If Yes, ask: "What is it, how was it measured, and when did it start?"      no 8. OTHER SYMPTOMS: "Do you have any other symptoms?" (e.g., sore throat, cough, earache, difficulty breathing)     Cough,  Protocols  used: Sinus Pain or Congestion-A-AH

## 2021-09-10 NOTE — Progress Notes (Signed)
Virtual Visit Consent   Alex [name], you are scheduled for a virtual visit with a [facility] provider today.     Just as with appointments in the office, your consent must be obtained to participate.  Your consent will be active for this visit and any virtual visit you may have with one of our providers in the next 365 days.     If you have a MyChart account, a copy of this consent can be sent to you electronically.  All virtual visits are billed to your insurance company just like a traditional visit in the office.    As this is a virtual visit, video technology does not allow for your provider to perform a traditional examination.  This may limit your provider's ability to fully assess your condition.  If your provider identifies any concerns that need to be evaluated in person or the need to arrange testing (such as labs, EKG, etc.), we will make arrangements to do so.     Although advances in technology are sophisticated, we cannot ensure that it will always work on either your end or our end.  If the connection with a video visit is poor, the visit may have to be switched to a telephone visit.  With either a video or telephone visit, we are not always able to ensure that we have a secure connection.     I need to obtain your verbal consent now.   Are you willing to proceed with your visit today?    Alex [name] has provided verbal consent on 09/10/2021 for a virtual visit (video or telephone).   Alex [name], [place]   Date: 09/10/2021 3:35 PM   Virtual Visit via Video Note   I, Alex [name], connected with  Alex [name]  ([id number], 1966/04/29) on 09/10/21 at  3:30 PM EST by a video-enabled telemedicine application and verified that I am speaking with the correct person using two identifiers.  Location: Patient: Virtual Visit Location Patient: Home Provider: Virtual Visit Location Provider: Home Office   I discussed the limitations of evaluation and management by  telemedicine and the availability of in person appointments. The patient expressed understanding and agreed to proceed.    History of Present Illness: Alex [name] is a 56 y.o. who identifies as a male who was assigned male at birth, and is being seen today for possible sinus infection. Patient endorses nasal congestion, sinus pressure, maxillary sinus pain worsening in the past 3 days. Now with more significant PND and cough that is productive of is drainage. Denies fever, chills or aches. Denies true chest congestion or SOB. Denies GI symptoms. Notes that what he is coughing up is a green-brown color. Wife sick back a month ago -- had to be evaluated multiple times and was finally treated for bacterial bronchitis. Denies taking COVID test.   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   Type 2 diabetes mellitus with diabetic chronic kidney disease (  [place]) 12/05/2008   Essential (primary) hypertension 12/05/2008   GERD (gastroesophageal reflux disease) 12/05/2008   Panic disorder 08/11/2007   Old myocardial infarct 02/08/2007   HLD (hyperlipidemia) 03/24/2006   Abnormal LFTs 08/10/2005   CAD (coronary artery disease) 08/11/2003    Allergies:  Allergies  Allergen Reactions   Aleve  [Naproxen Sodium]    Mobic  [Meloxicam]    Voltaren [Diclofenac Sodium] Nausea And Vomiting   Medications:  Current Outpatient Medications:    doxycycline (VIBRA-TABS) 100 MG tablet, Take 1  tablet (100 mg total) by mouth 2 (two) times daily., Disp: 20 tablet, Rfl: 0   alpha-1-proteinase inhibitor, human, (PROLASTIN-C) 1000 MG/20ML SOLN, Inject 60 mg/kg into the vein once a week., Disp: , Rfl:    ascorbic acid (VITAMIN C) 500 MG tablet, Take by mouth., Disp: , Rfl:    budesonide-formoterol (SYMBICORT) 160-4.5 MCG/ACT inhaler, Inhale 2 puffs into the lungs 2 (two) times daily., Disp: 6 g, Rfl: 11   clopidogrel (PLAVIX) 75 MG tablet, TAKE ONE TABLET BY MOUTH ONE TIME DAILY, Disp: 90 tablet, Rfl: 4   Coenzyme Q10 (COQ10) 200 MG CAPS, Take 1 capsule by mouth 2 (two) times daily., Disp: , Rfl:    Continuous Blood Gluc Sensor (FREESTYLE LIBRE 14 DAY SENSOR) MISC, APPLY ONE SENSOR TO THE BACK OF YOUR UPPER ARM. REPLACE EVERY 14 DAYS., Disp: , Rfl:    Cyanocobalamin (VITAMIN B 12) 250 MCG LOZG, Take by mouth., Disp: , Rfl:    cyclobenzaprine (FLEXERIL) 5 MG tablet, Take 1 tablet (5 mg total) by mouth at bedtime., Disp: 15 tablet, Rfl: 0   fluticasone (FLONASE) 50 MCG/ACT nasal spray, Place into the nose., Disp: , Rfl:    ibuprofen (ADVIL) 800 MG tablet, Take 1 tablet (800 mg total) by mouth every 8 (eight) hours as needed., Disp: 30 tablet, Rfl: 0   insulin regular human CONCENTRATED (HUMULIN R) 500 UNIT/ML injection, Use up to 250 units (as calculated by U100 syringe) daily via insulin pump, Disp: , Rfl:    ketoconazole (NIZORAL) 2 % shampoo, , Disp: , Rfl:    LORazepam (ATIVAN) 0.5 MG tablet, Take by mouth. 1-2 every 6 hours as needed, Disp: , Rfl:    metFORMIN (GLUCOPHAGE) 500 MG tablet, Take 500 mg by mouth 2 (two) times daily. , Disp: , Rfl:    methylPREDNISolone (MEDROL DOSEPAK) 4 MG TBPK tablet, Take as indicated as on package, 6 pills day 1, 5 pills day 2, etc, Disp: 1 each, Rfl: 0   Multiple Vitamin (MULTIVITAMIN) tablet, Take 1 tablet by mouth daily., Disp: , Rfl:    Omega-3 Fatty Acids (FISH OIL) 1000 MG CAPS, Take 1 capsule by mouth 2 (two) times daily. , Disp: , Rfl:    OVER THE  COUNTER MEDICATION, Take 600 mg by mouth in the morning and at bedtime. Beet Root, Disp: , Rfl:    pantoprazole (PROTONIX) 40 MG tablet, Take 1 tablet (40 mg total) by mouth daily., Disp: 90 tablet, Rfl: 4   rosuvastatin (CRESTOR) 40 MG tablet, Take 1 tablet (40 mg total) by mouth daily., Disp: 90 tablet, Rfl: 4   sertraline (ZOLOFT) 50 MG tablet, Take 1 tablet (50 mg total) by mouth daily., Disp: 90 tablet, Rfl: 4   tamsulosin (FLOMAX) 0.4 MG CAPS capsule, Take 1 capsule (0.4 mg total) by mouth daily., Disp: 30 capsule, Rfl: 0   telmisartan (MICARDIS) 40 MG tablet, Take 40 mg by mouth daily., Disp: , Rfl:   Observations/Objective: Patient is well-developed, well-nourished  in no acute distress.  Resting comfortably at home.  Head is normocephalic, atraumatic.  No labored breathing. Speech is clear and coherent with logical content.  Patient is alert and oriented at baseline.   Assessment and Plan: 1. Acute bacterial sinusitis  Will have him take a COVID test just to be cautious giving medical history. He will let us know results via MyChart in case we need to adjust treatment. This seems more consistent with a sinusitis. Rx Doxycyline.  Increase fluids.  Rest.  Saline nasal spray.  Probiotic.  Mucinex as directed.  Humidifier in bedroom.  Call or return to clinic if symptoms are not improving.   Follow Up Instructions: I discussed the assessment and treatment plan with the patient. The patient was provided an opportunity to ask questions and all were answered. The patient agreed with the plan and demonstrated an understanding of the instructions.  A copy of instructions were sent to the patient via MyChart unless otherwise noted below.   The patient was advised to call back or seek an in-person evaluation if the symptoms worsen or if the condition fails to improve as anticipated.  Time:  I spent 12 minutes with the patient via telehealth technology discussing the above problems/concerns.     Alex Rio, PA-C

## 2021-09-10 NOTE — Patient Instructions (Signed)
Alex [name], thank you for joining Leeanne Rio, PA-C for today's virtual visit.  While this provider is not your primary care provider (PCP), if your PCP is located in our provider database this encounter information will be shared with them immediately following your visit.  Consent: (Patient) Alex [name] provided verbal consent for this virtual visit at the beginning of the encounter.  Current Medications:  Current Outpatient Medications:    alpha-1-proteinase inhibitor, human, (PROLASTIN-C) 1000 MG/20ML SOLN, Inject 60 mg/kg into the vein once a week., Disp: , Rfl:    ascorbic acid (VITAMIN C) 500 MG tablet, Take by mouth., Disp: , Rfl:    budesonide-formoterol (SYMBICORT) 160-4.5 MCG/ACT inhaler, Inhale 2 puffs into the lungs 2 (two) times daily., Disp: 6 g, Rfl: 11   clopidogrel (PLAVIX) 75 MG tablet, TAKE ONE TABLET BY MOUTH ONE TIME DAILY, Disp: 90 tablet, Rfl: 4   Coenzyme Q10 (COQ10) 200 MG CAPS, Take 1 capsule by mouth 2 (two) times daily., Disp: , Rfl:    Continuous Blood Gluc Sensor (FREESTYLE LIBRE 14 DAY SENSOR) MISC, APPLY ONE SENSOR TO THE BACK OF YOUR UPPER ARM. REPLACE EVERY 14 DAYS., Disp: , Rfl:    Cyanocobalamin (VITAMIN B 12) 250 MCG LOZG, Take by mouth., Disp: , Rfl:    cyclobenzaprine (FLEXERIL) 5 MG tablet, Take 1 tablet (5 mg total) by mouth at bedtime., Disp: 15 tablet, Rfl: 0   fluticasone (FLONASE) 50 MCG/ACT nasal spray, Place into the nose., Disp: , Rfl:    ibuprofen (ADVIL) 800 MG tablet, Take 1 tablet (800 mg total) by mouth every 8 (eight) hours as needed., Disp: 30 tablet, Rfl: 0   insulin regular human CONCENTRATED (HUMULIN R) 500 UNIT/ML injection, Use up to 250 units (as calculated by U100 syringe) daily via insulin pump, Disp: , Rfl:    ketoconazole (NIZORAL) 2 % shampoo, , Disp: , Rfl:    LORazepam (ATIVAN) 0.5 MG tablet, Take by mouth. 1-2 every 6 hours as needed, Disp: , Rfl:    metFORMIN (GLUCOPHAGE) 500 MG tablet, Take 500 mg by mouth 2  (two) times daily. , Disp: , Rfl:    methylPREDNISolone (MEDROL DOSEPAK) 4 MG TBPK tablet, Take as indicated as on package, 6 pills day 1, 5 pills day 2, etc, Disp: 1 each, Rfl: 0   Multiple Vitamin (MULTIVITAMIN) tablet, Take 1 tablet by mouth daily., Disp: , Rfl:    Omega-3 Fatty Acids (FISH OIL) 1000 MG CAPS, Take 1 capsule by mouth 2 (two) times daily. , Disp: , Rfl:    OVER THE COUNTER MEDICATION, Take 600 mg by mouth in the morning and at bedtime. Beet Root, Disp: , Rfl:    pantoprazole (PROTONIX) 40 MG tablet, Take 1 tablet (40 mg total) by mouth daily., Disp: 90 tablet, Rfl: 4   rosuvastatin (CRESTOR) 40 MG tablet, Take 1 tablet (40 mg total) by mouth daily., Disp: 90 tablet, Rfl: 4   sertraline (ZOLOFT) 50 MG tablet, Take 1 tablet (50 mg total) by mouth daily., Disp: 90 tablet, Rfl: 4   tamsulosin (FLOMAX) 0.4 MG CAPS capsule, Take 1 capsule (0.4 mg total) by mouth daily., Disp: 30 capsule, Rfl: 0   telmisartan (MICARDIS) 40 MG tablet, Take 40 mg by mouth daily., Disp: , Rfl:    Medications ordered in this encounter:  No orders of the defined types were placed in this encounter.    *If you need refills on other medications prior to your next appointment, please contact your pharmacy*  Follow-Up: Call back or seek an in-person evaluation if the symptoms worsen or if the condition fails to improve as anticipated.  Other Instructions Please take antibiotic as directed.  Increase fluid intake.  Use Saline nasal spray.  Take a daily multivitamin. You can use Mucinex-DM or Coricidin-HBP for symptoms. Start OTC Vitamin C 1000 mg daily and Vitamin D3 1000 units daily.  Place a humidifier in the bedroom.  Please call or return clinic if symptoms are not improving.  Do not forget to message me back with your COVID results.   Sinusitis Sinusitis is redness, soreness, and swelling (inflammation) of the paranasal sinuses. Paranasal sinuses are air pockets within the bones of your face (beneath  the eyes, the middle of the forehead, or above the eyes). In healthy paranasal sinuses, mucus is able to drain out, and air is able to circulate through them by way of your nose. However, when your paranasal sinuses are inflamed, mucus and air can become trapped. This can allow bacteria and other germs to grow and cause infection. Sinusitis can develop quickly and last only a short time (acute) or continue over a long period (chronic). Sinusitis that lasts for more than 12 weeks is considered chronic.  CAUSES  Causes of sinusitis include: Allergies. Structural abnormalities, such as displacement of the cartilage that separates your nostrils (deviated septum), which can decrease the air flow through your nose and sinuses and affect sinus drainage. Functional abnormalities, such as when the small hairs (cilia) that line your sinuses and help remove mucus do not work properly or are not present. SYMPTOMS  Symptoms of acute and chronic sinusitis are the same. The primary symptoms are pain and pressure around the affected sinuses. Other symptoms include: Upper toothache. Earache. Headache. Bad breath. Decreased sense of smell and taste. A cough, which worsens when you are lying flat. Fatigue. Fever. Thick drainage from your nose, which often is green and may contain pus (purulent). Swelling and warmth over the affected sinuses. DIAGNOSIS  Your caregiver will perform a physical exam. During the exam, your caregiver may: Look in your nose for signs of abnormal growths in your nostrils (nasal polyps). Tap over the affected sinus to check for signs of infection. View the inside of your sinuses (endoscopy) with a special imaging device with a light attached (endoscope), which is inserted into your sinuses. If your caregiver suspects that you have chronic sinusitis, one or more of the following tests may be recommended: Allergy tests. Nasal culture A sample of mucus is taken from your nose and sent to  a lab and screened for bacteria. Nasal cytology A sample of mucus is taken from your nose and examined by your caregiver to determine if your sinusitis is related to an allergy. TREATMENT  Most cases of acute sinusitis are related to a viral infection and will resolve on their own within 10 days. Sometimes medicines are prescribed to help relieve symptoms (pain medicine, decongestants, nasal steroid sprays, or saline sprays).  However, for sinusitis related to a bacterial infection, your caregiver will prescribe antibiotic medicines. These are medicines that will help kill the bacteria causing the infection.  Rarely, sinusitis is caused by a fungal infection. In theses cases, your caregiver will prescribe antifungal medicine. For some cases of chronic sinusitis, surgery is needed. Generally, these are cases in which sinusitis recurs more than 3 times per year, despite other treatments. HOME CARE INSTRUCTIONS  Drink plenty of water. Water helps thin the mucus so your sinuses can drain more  easily. Use a humidifier. Inhale steam 3 to 4 times a day (for example, sit in the bathroom with the shower running). Apply a warm, moist washcloth to your face 3 to 4 times a day, or as directed by your caregiver. Use saline nasal sprays to help moisten and clean your sinuses. Take over-the-counter or prescription medicines for pain, discomfort, or fever only as directed by your caregiver. SEEK IMMEDIATE MEDICAL CARE IF: You have increasing pain or severe headaches. You have nausea, vomiting, or drowsiness. You have swelling around your face. You have vision problems. You have a stiff neck. You have difficulty breathing. MAKE SURE YOU:  Understand these instructions. Will watch your condition. Will get help right away if you are not doing well or get worse. Document Released: 07/27/2005 Document Revised: 10/19/2011 Document Reviewed: 08/11/2011 [facility] Patient Information 2014 [place], [place].    If  you have been instructed to have an in-person evaluation today at a local Urgent Care facility, please use the link below. It will take you to a list of all of our available [facility], including address, phone number and hours of operation. Please do not delay care.  [facility]  If you or a family member do not have a primary care provider, use the link below to schedule a visit and establish care. When you choose a [facility] primary care physician or advanced practice provider, you gain a long-term partner in health. Find a Primary Care Provider  Learn more about [facility] in-office and virtual care options: [facility]

## 2021-09-17 ENCOUNTER — Encounter: Payer: Self-pay | Admitting: Family Medicine

## 2021-09-17 ENCOUNTER — Ambulatory Visit (INDEPENDENT_AMBULATORY_CARE_PROVIDER_SITE_OTHER): Payer: BC Managed Care – PPO | Admitting: Family Medicine

## 2021-09-17 ENCOUNTER — Other Ambulatory Visit: Payer: Self-pay

## 2021-09-17 VITALS — BP 150/82 | HR 79 | Temp 97.8°F | Resp 14 | Wt 239.0 lb

## 2021-09-17 DIAGNOSIS — J301 Allergic rhinitis due to pollen: Secondary | ICD-10-CM

## 2021-09-17 DIAGNOSIS — M542 Cervicalgia: Secondary | ICD-10-CM

## 2021-09-17 DIAGNOSIS — B9689 Other specified bacterial agents as the cause of diseases classified elsewhere: Secondary | ICD-10-CM

## 2021-09-17 DIAGNOSIS — R052 Subacute cough: Secondary | ICD-10-CM | POA: Diagnosis not present

## 2021-09-17 DIAGNOSIS — J019 Acute sinusitis, unspecified: Secondary | ICD-10-CM | POA: Diagnosis not present

## 2021-09-17 MED ORDER — HYDROCODONE BIT-HOMATROP MBR 5-1.5 MG/5ML PO SOLN: 5.0000 mL | Freq: Three times a day (TID) | ORAL | 0 refills | Status: DC | PRN

## 2021-09-17 NOTE — Progress Notes (Signed)
I,Roshena L Chambers,acting as a scribe for Lelon Huh, MD.,have documented all relevant documentation on the behalf of Lelon Huh, MD,as directed by  Lelon Huh, MD while in the presence of Lelon Huh, MD.   Established patient visit   Patient: Alex [name]   DOB: 1966/06/28   56 y.o. Male  MRN: [id number] Visit Date: 09/17/2021  Today's healthcare provider: Lelon Huh, MD   Chief Complaint  Patient presents with   Cough   Subjective    Cough This is a new problem. Episode onset: 10 days ago. The problem has been gradually improving. The cough is Productive of sputum. Associated symptoms include headaches. Pertinent negatives include no chest pain, chills, fever, shortness of breath or wheezing. Treatments tried: Mucinex and Doxy 100mg  BID x 10 days. The treatment provided mild relief.   Patient had a virtual visit through [facility] health on 09/10/2021. He was prescribed Doxycycline 100mg .  Home COVID test was negative on 09/10/2021. No difficulty breathing. Is starting to feel a little better today, but still coughing constantly, keeping up at night, and having copious clear nasal drainage.   Medications: Outpatient Medications Prior to Visit  Medication Sig   alpha-1-proteinase inhibitor, human, (PROLASTIN-C) 1000 MG/20ML SOLN Inject 60 mg/kg into the vein once a week.   ascorbic acid (VITAMIN C) 500 MG tablet Take by mouth.   budesonide-formoterol (SYMBICORT) 160-4.5 MCG/ACT inhaler Inhale 2 puffs into the lungs 2 (two) times daily.   clopidogrel (PLAVIX) 75 MG tablet TAKE ONE TABLET BY MOUTH ONE TIME DAILY   Coenzyme Q10 (COQ10) 200 MG CAPS Take 1 capsule by mouth 2 (two) times daily.   Continuous Blood Gluc Sensor (FREESTYLE LIBRE 14 DAY SENSOR) MISC APPLY ONE SENSOR TO THE BACK OF YOUR UPPER ARM. REPLACE EVERY 14 DAYS.   Cyanocobalamin (VITAMIN B 12) 250 MCG LOZG Take by mouth.   doxycycline (VIBRA-TABS) 100 MG tablet Take 1 tablet (100 mg total) by  mouth 2 (two) times daily.   fluticasone (FLONASE) 50 MCG/ACT nasal spray Place into the nose.   ibuprofen (ADVIL) 800 MG tablet Take 1 tablet (800 mg total) by mouth every 8 (eight) hours as needed.   insulin regular human CONCENTRATED (HUMULIN R) 500 UNIT/ML injection Use up to 250 units (as calculated by U100 syringe) daily via insulin pump   ketoconazole (NIZORAL) 2 % shampoo    LORazepam (ATIVAN) 0.5 MG tablet Take by mouth. 1-2 every 6 hours as needed   metFORMIN (GLUCOPHAGE) 500 MG tablet Take 500 mg by mouth 2 (two) times daily.    Multiple Vitamin (MULTIVITAMIN) tablet Take 1 tablet by mouth daily.   Omega-3 Fatty Acids (FISH OIL) 1000 MG CAPS Take 1 capsule by mouth 2 (two) times daily.    OVER THE COUNTER MEDICATION Take 600 mg by mouth in the morning and at bedtime. Beet Root   pantoprazole (PROTONIX) 40 MG tablet Take 1 tablet (40 mg total) by mouth daily.   rosuvastatin (CRESTOR) 40 MG tablet Take 1 tablet (40 mg total) by mouth daily.   sertraline (ZOLOFT) 50 MG tablet Take 1 tablet (50 mg total) by mouth daily.   tamsulosin (FLOMAX) 0.4 MG CAPS capsule Take 1 capsule (0.4 mg total) by mouth daily.   telmisartan (MICARDIS) 40 MG tablet Take 40 mg by mouth daily.   [DISCONTINUED] cyclobenzaprine (FLEXERIL) 5 MG tablet Take 1 tablet (5 mg total) by mouth at bedtime.   [DISCONTINUED] methylPREDNISolone (MEDROL DOSEPAK) 4 MG TBPK tablet  Take as indicated as on package, 6 pills day 1, 5 pills day 2, etc   No facility-administered medications prior to visit.    Review of Systems  Constitutional:  Negative for appetite change, chills and fever.  HENT:  Positive for sinus pressure.        Lymph node swelling on right side of neck  Respiratory:  Positive for cough (productive with brown green phlegm). Negative for chest tightness, shortness of breath and wheezing.   Cardiovascular:  Negative for chest pain and palpitations.  Gastrointestinal:  Negative for abdominal pain, nausea and  vomiting.  Neurological:  Positive for headaches.      Objective    BP (!) 150/82 (BP Location: Right Arm, Patient Position: Sitting, Cuff Size: Large)    Pulse 79    Temp 97.8 F (36.6 C) (Oral)    Resp 14    Wt 239 lb (108.4 kg)    SpO2 100% Comment: room air   BMI 34.29 kg/m  {Show previous vital signs (optional):23777} Today's Vitals   09/17/21 1126 09/17/21 1133  BP: (!) 152/81 (!) 150/82  Pulse: 79   Resp: 14   Temp: 97.8 F (36.6 C)   TempSrc: Oral   SpO2: 100%   Weight: 239 lb (108.4 kg)    Body mass index is 34.29 kg/m.    Physical Exam   General Appearance:    Mildly obese male, alert, cooperative, in no acute distress  HENT:   bilateral TM normal without fluid or infection, sinuses nontender, post nasal drip noted, and nasal mucosa pale and congested  Eyes:    PERRL, conjunctiva/corneas clear, EOM's intact       Lungs:     Clear to auscultation bilaterally, respirations unlabored  Heart:    Normal heart rate. Normal rhythm. No murmurs, rubs, or gallops.    Neurologic:   Awake, alert, oriented x 3. No apparent focal neurological           defect.         Assessment & Plan     1. Acute bacterial sinusitis Improving on doxycycline.   2. Subacute cough  Secondary to copious upper respiratory drainage.  - HYDROcodone bit-homatropine (HYCODAN) 5-1.5 MG/5ML syrup; Take 5 mLs by mouth every 8 (eight) hours as needed for cough.  Dispense: 120 mL; Refill: 0 Suggested he start back on nasal steroid.   3. Allergic rhinitis due to pollen, unspecified seasonality Nasal steroid as above.   4. Neck pain on right side He has been seeing ortho for left shoulder pain, but now having occasional click and quick sharp pain in his neck. He can follow up at ortho or let me know if he just wants order for xray.       The entirety of the information documented in the History of Present Illness, Review of Systems and Physical Exam were personally obtained by me. Portions of  this information were initially documented by the CMA and reviewed by me for thoroughness and accuracy.     Lelon Huh, MD  [facility] [phone] (phone) [phone] (fax)  [place]

## 2021-09-30 DIAGNOSIS — E1129 Type 2 diabetes mellitus with other diabetic kidney complication: Secondary | ICD-10-CM | POA: Diagnosis not present

## 2021-09-30 DIAGNOSIS — E1159 Type 2 diabetes mellitus with other circulatory complications: Secondary | ICD-10-CM | POA: Diagnosis not present

## 2021-09-30 DIAGNOSIS — E113293 Type 2 diabetes mellitus with mild nonproliferative diabetic retinopathy without macular edema, bilateral: Secondary | ICD-10-CM | POA: Diagnosis not present

## 2021-09-30 DIAGNOSIS — Z9641 Presence of insulin pump (external) (internal): Secondary | ICD-10-CM | POA: Diagnosis not present

## 2021-10-02 ENCOUNTER — Encounter: Payer: Self-pay | Admitting: Pulmonary Disease

## 2021-10-02 MED ORDER — BUDESONIDE-FORMOTEROL FUMARATE 160-4.5 MCG/ACT IN AERO: 2.0000 | INHALATION_SPRAY | Freq: Two times a day (BID) | RESPIRATORY_TRACT | 5 refills | Status: DC

## 2021-10-02 NOTE — Telephone Encounter (Signed)
Symbicort 160 sent into pt's preferred pharmacy. Nothing further needed.

## 2021-10-09 ENCOUNTER — Telehealth: Payer: Self-pay

## 2021-10-09 NOTE — Telephone Encounter (Signed)
Dr Patsey Berthold, Please advise: ? ? ?[facility] pharmacy has informed me that my insurance company has denied my Symbicort. They also said they sent it back to [facility] for her to look into it and I was wondering if she had gotten anywhere with it or not. Thank you , Valerie Roys  ? ?

## 2021-10-09 NOTE — Telephone Encounter (Signed)
I had not heard any of this.  Maybe forward to the pharmacy team to see what is covered. ?

## 2021-10-10 ENCOUNTER — Telehealth: Payer: Self-pay | Admitting: Pharmacy Technician

## 2021-10-10 ENCOUNTER — Other Ambulatory Visit (HOSPITAL_COMMUNITY): Payer: Self-pay

## 2021-10-10 NOTE — Telephone Encounter (Signed)
Patient Advocate Encounter ? ?Received notification from [facility] ([employer]) that prior authorization for SYMBICORT 160MCG is required. ?  ?PA submitted on 3.3.23 ?Key  [id number]  ?Status is pending ? ?PA has been approved: ?Start Date:09/10/2021  End Date:10/10/2022 ?  ?[facility] will continue to follow ? ?Jenniah Bhavsar R Latorie Montesano, CPhT ?Patient Advocate ?Phone: [phone] ?Fax:  [phone] ? ?

## 2021-10-10 NOTE — Telephone Encounter (Signed)
Test billing for ICS/LABA are as follows: ?Symbicort: requires a PA ?Advair Diskus: requires a PA ?Advair HFA: requires a PA ?Wixela: Requires a PA ?Breo: Requires a PA ?Dulera: Requires a PA ? ?I submitted a PA for Sybicort since it was what was originally prescribed. It is approved and I've since spoken with Mr Duffner and pharmacy to reprocess. Pt is aware of cost ($94.75) ? ?Nothing else needed. ?

## 2021-10-10 NOTE — Telephone Encounter (Signed)
Can anyone assist on finding another alternative for patient. Symbicort is not covered. ?

## 2021-11-25 ENCOUNTER — Telehealth: Payer: Self-pay | Admitting: Pulmonary Disease

## 2021-11-25 NOTE — Telephone Encounter (Signed)
Per Dr. Patsey Berthold verbally--no change to prolastin Rx.  ?Alex [name] with [facility] is aware and voiced his understanding.  ?Nothing further needed.  ? ?

## 2021-12-22 ENCOUNTER — Other Ambulatory Visit: Payer: Self-pay | Admitting: Family Medicine

## 2021-12-22 DIAGNOSIS — F410 Panic disorder [episodic paroxysmal anxiety] without agoraphobia: Secondary | ICD-10-CM

## 2021-12-22 DIAGNOSIS — K219 Gastro-esophageal reflux disease without esophagitis: Secondary | ICD-10-CM

## 2021-12-22 DIAGNOSIS — I2510 Atherosclerotic heart disease of native coronary artery without angina pectoris: Secondary | ICD-10-CM

## 2021-12-25 ENCOUNTER — Other Ambulatory Visit: Payer: Self-pay | Admitting: Family Medicine

## 2021-12-25 DIAGNOSIS — I2510 Atherosclerotic heart disease of native coronary artery without angina pectoris: Secondary | ICD-10-CM

## 2021-12-25 DIAGNOSIS — F410 Panic disorder [episodic paroxysmal anxiety] without agoraphobia: Secondary | ICD-10-CM

## 2021-12-31 ENCOUNTER — Encounter: Payer: Self-pay | Admitting: Pulmonary Disease

## 2021-12-31 NOTE — Telephone Encounter (Signed)
Okay to give double dose infusion prior to him leaving on vacation.

## 2021-12-31 NOTE — Telephone Encounter (Signed)
Dr. Gonzalez, please advise. Thanks 

## 2022-01-06 DIAGNOSIS — E1159 Type 2 diabetes mellitus with other circulatory complications: Secondary | ICD-10-CM | POA: Diagnosis not present

## 2022-01-06 DIAGNOSIS — R809 Proteinuria, unspecified: Secondary | ICD-10-CM | POA: Diagnosis not present

## 2022-01-06 DIAGNOSIS — Z794 Long term (current) use of insulin: Secondary | ICD-10-CM | POA: Diagnosis not present

## 2022-01-06 DIAGNOSIS — Z9641 Presence of insulin pump (external) (internal): Secondary | ICD-10-CM | POA: Diagnosis not present

## 2022-01-06 DIAGNOSIS — E1129 Type 2 diabetes mellitus with other diabetic kidney complication: Secondary | ICD-10-CM | POA: Diagnosis not present

## 2022-01-12 DIAGNOSIS — M5412 Radiculopathy, cervical region: Secondary | ICD-10-CM | POA: Diagnosis not present

## 2022-01-13 DIAGNOSIS — E119 Type 2 diabetes mellitus without complications: Secondary | ICD-10-CM | POA: Diagnosis not present

## 2022-01-19 ENCOUNTER — Ambulatory Visit (INDEPENDENT_AMBULATORY_CARE_PROVIDER_SITE_OTHER): Payer: BC Managed Care – PPO | Admitting: Pulmonary Disease

## 2022-01-19 ENCOUNTER — Encounter: Payer: Self-pay | Admitting: Pulmonary Disease

## 2022-01-19 VITALS — BP 124/68 | HR 87 | Temp 97.8°F | Ht 70.0 in | Wt 244.8 lb

## 2022-01-19 DIAGNOSIS — J449 Chronic obstructive pulmonary disease, unspecified: Secondary | ICD-10-CM | POA: Diagnosis not present

## 2022-01-19 DIAGNOSIS — E8801 Alpha-1-antitrypsin deficiency: Secondary | ICD-10-CM

## 2022-01-19 DIAGNOSIS — K7469 Other cirrhosis of liver: Secondary | ICD-10-CM

## 2022-01-19 DIAGNOSIS — J4489 Other specified chronic obstructive pulmonary disease: Secondary | ICD-10-CM

## 2022-01-19 NOTE — Patient Instructions (Signed)
We will continue the infusions.  We will see him in follow-up in 6 months time we will obtain breathing test before you come back.  Do call sooner should any new problems arise

## 2022-01-19 NOTE — Progress Notes (Signed)
Subjective:    Patient ID: Alex [name], male    DOB: 1965-09-30, 56 y.o.   MRN: [id number]  Patient Care Team: Birdie Sons, MD as PCP - General (Family Medicine) Corey Skains, MD as Consulting Physician (Internal Medicine) Gabriel Carina Betsey Holiday, MD as Physician Assistant (Internal Medicine)  Chief Complaint  Patient presents with   Follow-up    No current sx.    HPI The patient is a 56 year old former smoker (34 PY) who presents for follow-up on the issue of alpha-1 antitrypsin deficiency with SZ phenotype.  He is currently on Prolastin infusion weekly.  We last saw him on 17 July 2021 this is a scheduled follow-up. He has been doing very well without monthly exacerbations of asthma/COPD overlap since on Prolastin.  He also notes increased stamina since he has been on Prolastin.  He continues to use Symbicort and as needed albuterol mostly during seasonal change.  Dyspnea only occurs with maximal/heavy effort.  No wheezing at all.  No cough, sputum production or hemoptysis.  No fevers, chills or sweats.  No orthopnea or paroxysmal nocturnal dyspnea.  No lower extremity edema or calf tenderness.  No chest pain. Overall he feels well and looks well.   Review of Systems A 10 point review of systems was performed and it is as noted above otherwise negative.  Patient Active Problem List   Diagnosis Date Noted   AAT (alpha-1-antitrypsin) deficiency ([place]) 04/10/2019    Priority: High   Cervical radiculopathy 01/12/2022   History of colon polyps 12/24/2020   Cirrhosis of liver ([place]) 04/04/2019   Fatty liver 04/04/2019   Obesity 07/05/2017   Gallbladder polyp 01/20/2016   Allergic rhinitis 01/20/2016   Fatigue 01/20/2016   Other long term (current) drug therapy 01/20/2016   Microalbuminuria 08/07/2014   Aortic atherosclerosis (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   Type 2 diabetes mellitus with  diabetic chronic kidney disease ([place]) 12/05/2008   Essential (primary) hypertension 12/05/2008   GERD (gastroesophageal reflux disease) 12/05/2008   Panic disorder 08/11/2007   Old myocardial infarct 02/08/2007   HLD (hyperlipidemia) 03/24/2006   Abnormal LFTs 08/10/2005   CAD (coronary artery disease) 08/11/2003   Social History   Tobacco Use   Smoking status: Former    Packs/day: 1.50    Years: 20.00    Total pack years: 30.00    Types: Cigarettes    Quit date: 08/10/2006    Years since quitting: 15.4   Smokeless tobacco: Never  Substance Use Topics   Alcohol use: No    Alcohol/week: 0.0 standard drinks of alcohol   Allergies  Allergen Reactions   Aleve  [Naproxen Sodium]    Mobic  [Meloxicam]    Voltaren [Diclofenac Sodium] Nausea And Vomiting   Current Meds  Medication Sig   alpha-1-proteinase inhibitor, human, (PROLASTIN-C) 1000 MG/20ML SOLN Inject 60 mg/kg into the vein once a week.   ascorbic acid (VITAMIN C) 500 MG tablet Take by mouth.   budesonide-formoterol (SYMBICORT) 160-4.5 MCG/ACT inhaler Inhale 2 puffs into the lungs 2 (two) times daily.   clopidogrel (PLAVIX) 75 MG tablet TAKE ONE TABLET BY MOUTH ONE TIME DAILY   Coenzyme Q10 (COQ10) 200 MG CAPS Take 1 capsule by mouth 2 (two) times daily.   Continuous Blood Gluc Sensor (FREESTYLE LIBRE 14 DAY SENSOR) MISC APPLY ONE SENSOR TO THE BACK OF YOUR UPPER ARM. REPLACE EVERY 14 DAYS.  Cyanocobalamin (VITAMIN B 12) 250 MCG LOZG Take by mouth.   fluticasone (FLONASE) 50 MCG/ACT nasal spray Place into the nose.   HYDROcodone bit-homatropine (HYCODAN) 5-1.5 MG/5ML syrup Take 5 mLs by mouth every 8 (eight) hours as needed for cough.   ibuprofen (ADVIL) 800 MG tablet Take 1 tablet (800 mg total) by mouth every 8 (eight) hours as needed.   insulin regular human CONCENTRATED (HUMULIN R) 500 UNIT/ML injection Use up to 250 units (as calculated by U100 syringe) daily via insulin pump   ketoconazole (NIZORAL) 2 % shampoo     LORazepam (ATIVAN) 0.5 MG tablet Take by mouth. 1-2 every 6 hours as needed   metFORMIN (GLUCOPHAGE) 500 MG tablet Take 500 mg by mouth 2 (two) times daily.    Multiple Vitamin (MULTIVITAMIN) tablet Take 1 tablet by mouth daily.   Omega-3 Fatty Acids (FISH OIL) 1000 MG CAPS Take 1 capsule by mouth 2 (two) times daily.    OVER THE COUNTER MEDICATION Take 600 mg by mouth in the morning and at bedtime. Beet Root   pantoprazole (PROTONIX) 40 MG tablet TAKE ONE TABLET BY MOUTH ONE TIME DAILY   rosuvastatin (CRESTOR) 40 MG tablet TAKE ONE TABLET BY MOUTH ONE TIME DAILY   sertraline (ZOLOFT) 50 MG tablet TAKE ONE TABLET BY MOUTH ONE TIME DAILY   tamsulosin (FLOMAX) 0.4 MG CAPS capsule Take 1 capsule (0.4 mg total) by mouth daily.   telmisartan (MICARDIS) 40 MG tablet Take 40 mg by mouth daily.   [DISCONTINUED] budesonide-formoterol (SYMBICORT) 160-4.5 MCG/ACT inhaler Inhale 2 puffs into the lungs 2 (two) times daily.   [DISCONTINUED] doxycycline (VIBRA-TABS) 100 MG tablet Take 1 tablet (100 mg total) by mouth 2 (two) times daily.   Immunization History  Administered Date(s) Administered   Hep A / Hep B 04/06/2019, 05/04/2019   Influenza,inj,Quad PF,6+ Mos 04/20/2018, 04/10/2019   Influenza-Unspecified 04/28/2017   Pneumococcal Polysaccharide-23 07/23/2011   Tdap 07/23/2011   Zoster Recombinat (Shingrix) 04/20/2018, 06/22/2018       Objective:   Physical Exam BP 124/68 (BP Location: Left Arm, Cuff Size: Large)   Pulse 87   Temp 97.8 F (36.6 C) (Temporal)   Ht '5\' 10"'$  (1.778 m)   Wt 244 lb 12.8 oz (111 kg)   SpO2 100%   BMI 35.13 kg/m  GENERAL: Overweight gentleman, fully ambulatory, no distress, no conversational dyspnea.   HEAD: Normocephalic, atraumatic.  EYES: Pupils equal, round, reactive to light.  No scleral icterus.  MOUTH: Oral mucosa moist, no thrush.  Dentition intact.   NECK: Supple. No thyromegaly. No nodules. No JVD.  Trachea midline, phonation normal. PULMONARY: Lungs  with coarse breath sounds throughout.  Mild end expiratory wheezing noted. CARDIOVASCULAR: S1 and S2. Regular rate and rhythm.  Grade 1/6 to 2/6 systolic ejection murmur consistent with mitral regurgitation. GASTROINTESTINAL: Benign. MUSCULOSKELETAL: No joint swelling, no clubbing, no edema.  NEUROLOGIC: Awake alert, no focal deficits. SKIN: Intact,warm,dry.  No rashes noted on limited exam. PSYCH: Mood and behavior normal.      Assessment & Plan:     ICD-10-CM   1. AAT (alpha-1-antitrypsin) deficiency (H[facility])  E88.01 Pulmonary Function Test [facility] Only   SZ phenotype Low AAT levels and liver cirrhosis On weekly Prolastin, tolerating Continue same     2. Asthma-COPD overlap syndrome ([facility])  J44.9    No recent exacerbation Last exacerbation February 2023 due to sinusitis    3. Other cirrhosis of liver ([place])  K74.69    Continue follow-up with GI Issue  adds complexity to his management Sees Dr. Vicente Males     Orders Placed This Encounter  Procedures   Pulmonary Function Test [facility] Only    Standing Status:   Future    Standing Expiration Date:   01/20/2023    Scheduling Instructions:     68mo  Continue weekly infusions of Prolastin.  We will see the patient in follow-up in 6 months time we will obtain PFTs prior to follow-up.  He is to contact [facility] to that time should any new difficulties arise.   CRenold Don MD Advanced Bronchoscopy PCCM [facility]    *This note was dictated using voice recognition software/Dragon.  Despite best efforts to proofread, errors can occur which can change the meaning. Any transcriptional errors that result from this process are unintentional and may not be fully corrected at the time of dictation.

## 2022-01-29 DIAGNOSIS — D0461 Carcinoma in situ of skin of right upper limb, including shoulder: Secondary | ICD-10-CM | POA: Diagnosis not present

## 2022-01-29 DIAGNOSIS — L570 Actinic keratosis: Secondary | ICD-10-CM | POA: Diagnosis not present

## 2022-01-29 DIAGNOSIS — Z85828 Personal history of other malignant neoplasm of skin: Secondary | ICD-10-CM | POA: Diagnosis not present

## 2022-01-29 DIAGNOSIS — D485 Neoplasm of uncertain behavior of skin: Secondary | ICD-10-CM | POA: Diagnosis not present

## 2022-01-29 DIAGNOSIS — D2272 Melanocytic nevi of left lower limb, including hip: Secondary | ICD-10-CM | POA: Diagnosis not present

## 2022-01-29 DIAGNOSIS — X32XXXA Exposure to sunlight, initial encounter: Secondary | ICD-10-CM | POA: Diagnosis not present

## 2022-01-29 DIAGNOSIS — D225 Melanocytic nevi of trunk: Secondary | ICD-10-CM | POA: Diagnosis not present

## 2022-01-29 DIAGNOSIS — D2262 Melanocytic nevi of left upper limb, including shoulder: Secondary | ICD-10-CM | POA: Diagnosis not present

## 2022-02-04 NOTE — Telephone Encounter (Signed)
Spoke to Marlette with  [employer] and gave verbal to do double infusion of prolastin. Nothing further needed.

## 2022-03-16 DIAGNOSIS — E1159 Type 2 diabetes mellitus with other circulatory complications: Secondary | ICD-10-CM | POA: Diagnosis not present

## 2022-03-16 LAB — PROTEIN / CREATININE RATIO, URINE
Albumin, U: 17
Creatinine, Urine: 95.3

## 2022-03-16 LAB — MICROALBUMIN / CREATININE URINE RATIO: Microalb Creat Ratio: 17.8

## 2022-03-19 DIAGNOSIS — I10 Essential (primary) hypertension: Secondary | ICD-10-CM | POA: Diagnosis not present

## 2022-03-19 DIAGNOSIS — I700 Atherosclerosis of aorta: Secondary | ICD-10-CM | POA: Diagnosis not present

## 2022-03-19 DIAGNOSIS — I2510 Atherosclerotic heart disease of native coronary artery without angina pectoris: Secondary | ICD-10-CM | POA: Diagnosis not present

## 2022-03-24 DIAGNOSIS — Z9641 Presence of insulin pump (external) (internal): Secondary | ICD-10-CM | POA: Diagnosis not present

## 2022-03-24 DIAGNOSIS — R809 Proteinuria, unspecified: Secondary | ICD-10-CM | POA: Diagnosis not present

## 2022-03-24 DIAGNOSIS — E1159 Type 2 diabetes mellitus with other circulatory complications: Secondary | ICD-10-CM | POA: Diagnosis not present

## 2022-03-24 DIAGNOSIS — E1129 Type 2 diabetes mellitus with other diabetic kidney complication: Secondary | ICD-10-CM | POA: Diagnosis not present

## 2022-03-31 DIAGNOSIS — D0461 Carcinoma in situ of skin of right upper limb, including shoulder: Secondary | ICD-10-CM | POA: Diagnosis not present

## 2022-05-07 DIAGNOSIS — E119 Type 2 diabetes mellitus without complications: Secondary | ICD-10-CM | POA: Diagnosis not present

## 2022-06-01 LAB — HM DIABETES EYE EXAM

## 2022-06-26 ENCOUNTER — Ambulatory Visit: Payer: BC Managed Care – PPO | Attending: Pulmonary Disease

## 2022-06-26 DIAGNOSIS — Z87891 Personal history of nicotine dependence: Secondary | ICD-10-CM | POA: Diagnosis not present

## 2022-06-26 DIAGNOSIS — E8801 Alpha-1-antitrypsin deficiency: Secondary | ICD-10-CM | POA: Diagnosis not present

## 2022-06-26 LAB — PULMONARY FUNCTION TEST ARMC ONLY
DL/VA % pred: 111 %
DL/VA: 4.86 ml/min/mmHg/L
DLCO unc % pred: 88 %
DLCO unc: 25.72 ml/min/mmHg
FEF 25-75 Post: 3.12 L/sec
FEF 25-75 Pre: 2.1 L/sec
FEF2575-%Change-Post: 48 %
FEF2575-%Pred-Post: 93 %
FEF2575-%Pred-Pre: 63 %
FEV1-%Change-Post: 10 %
FEV1-%Pred-Post: 68 %
FEV1-%Pred-Pre: 62 %
FEV1-Post: 2.65 L
FEV1-Pre: 2.4 L
FEV1FVC-%Change-Post: 3 %
FEV1FVC-%Pred-Pre: 102 %
FEV6-%Change-Post: 6 %
FEV6-%Pred-Post: 67 %
FEV6-%Pred-Pre: 63 %
FEV6-Post: 3.26 L
FEV6-Pre: 3.05 L
FEV6FVC-%Pred-Post: 104 %
FEV6FVC-%Pred-Pre: 104 %
FVC-%Change-Post: 6 %
FVC-%Pred-Post: 64 %
FVC-%Pred-Pre: 60 %
FVC-Post: 3.26 L
FVC-Pre: 3.05 L
Post FEV1/FVC ratio: 81 %
Post FEV6/FVC ratio: 100 %
Pre FEV1/FVC ratio: 79 %
Pre FEV6/FVC Ratio: 100 %
RV % pred: 101 %
RV: 2.17 L
TLC % pred: 90 %
TLC: 6.38 L

## 2022-06-26 MED ORDER — ALBUTEROL SULFATE (2.5 MG/3ML) 0.083% IN NEBU
2.5000 mg | INHALATION_SOLUTION | Freq: Once | RESPIRATORY_TRACT | Status: AC
  Administered 2022-06-26: 2.5 mg via RESPIRATORY_TRACT
  Filled 2022-06-26: qty 3

## 2022-07-15 DIAGNOSIS — E119 Type 2 diabetes mellitus without complications: Secondary | ICD-10-CM | POA: Diagnosis not present

## 2022-07-16 ENCOUNTER — Ambulatory Visit: Payer: BC Managed Care – PPO | Admitting: Pulmonary Disease

## 2022-07-17 DIAGNOSIS — E119 Type 2 diabetes mellitus without complications: Secondary | ICD-10-CM | POA: Diagnosis not present

## 2022-07-17 DIAGNOSIS — Z794 Long term (current) use of insulin: Secondary | ICD-10-CM | POA: Diagnosis not present

## 2022-07-17 DIAGNOSIS — E1159 Type 2 diabetes mellitus with other circulatory complications: Secondary | ICD-10-CM | POA: Diagnosis not present

## 2022-07-20 ENCOUNTER — Telehealth: Payer: Self-pay | Admitting: Gastroenterology

## 2022-07-20 ENCOUNTER — Ambulatory Visit (INDEPENDENT_AMBULATORY_CARE_PROVIDER_SITE_OTHER): Payer: BC Managed Care – PPO | Admitting: Pulmonary Disease

## 2022-07-20 ENCOUNTER — Encounter: Payer: Self-pay | Admitting: Pulmonary Disease

## 2022-07-20 VITALS — BP 122/80 | HR 78 | Temp 98.0°F | Ht 70.0 in | Wt 243.8 lb

## 2022-07-20 DIAGNOSIS — I2510 Atherosclerotic heart disease of native coronary artery without angina pectoris: Secondary | ICD-10-CM

## 2022-07-20 DIAGNOSIS — J4489 Other specified chronic obstructive pulmonary disease: Secondary | ICD-10-CM

## 2022-07-20 DIAGNOSIS — K7469 Other cirrhosis of liver: Secondary | ICD-10-CM | POA: Diagnosis not present

## 2022-07-20 DIAGNOSIS — E8801 Alpha-1-antitrypsin deficiency: Secondary | ICD-10-CM

## 2022-07-20 MED ORDER — ALBUTEROL SULFATE HFA 108 (90 BASE) MCG/ACT IN AERS: 2.0000 | INHALATION_SPRAY | Freq: Four times a day (QID) | RESPIRATORY_TRACT | 2 refills | Status: DC | PRN

## 2022-07-20 MED ORDER — TRELEGY ELLIPTA 100-62.5-25 MCG/ACT IN AEPB: 1.0000 | INHALATION_SPRAY | Freq: Every day | RESPIRATORY_TRACT | 0 refills | Status: DC

## 2022-07-20 MED ORDER — TRELEGY ELLIPTA 100-62.5-25 MCG/ACT IN AEPB: 1.0000 | INHALATION_SPRAY | Freq: Every day | RESPIRATORY_TRACT | 11 refills | Status: AC

## 2022-07-20 NOTE — Telephone Encounter (Signed)
Patient's wife stated that patient is due for a colonoscopy but she is calling because she needs an appointment in the office with Dr Vicente Males. Patient's pulmonology doctor want patient to follow up for his cirrhosis of the liver which patient saw Dr Vicente Males but that was 3 years ago 05/04/2019.

## 2022-07-20 NOTE — Telephone Encounter (Signed)
Patient wife left vm to schedule appointment. Patient needs to be scheduled for a repeat colonoscopy. Please return call.

## 2022-07-20 NOTE — Patient Instructions (Signed)
You may discontinue your Symbicort.  Your new inhaler will be Trelegy Ellipta 1 inhalation daily.  Sure you rinse your mouth well after you use it.  We also have sent the prescription for a rescue inhaler in case you needed.  Continue your Prolastin infusions.  Make sure to make an appointment with Dr. Vicente Males for follow-up on your liver issues.  We have made a referral to cardiology for follow-up on your heart issues.  We will see you in follow-up in 4 to 6 months time call sooner should any new problems arise.

## 2022-07-20 NOTE — Progress Notes (Signed)
Subjective:    Patient ID: Alex [name], male    DOB: 1966/05/25, 56 y.o.   MRN: [id number] Patient Care Team: Birdie Sons, MD as PCP - General (Family Medicine) Corey Skains, MD as Consulting Physician (Internal Medicine) Gabriel Carina Betsey Holiday, MD as Physician Assistant (Internal Medicine)  Chief Complaint  Patient presents with   Follow-up    No SOB or wheezing. Has a dry cough due to sinus drainage.   HPI The patient is a 56 year old former smoker (27 PY) who presents for follow-up on the issue of alpha-1 antitrypsin deficiency with SZ phenotype.  He is currently on Prolastin infusion weekly.  We last saw him on 19 January 2022 this is a scheduled follow-up. He has been doing very well without monthly exacerbations of asthma/COPD overlap since on Prolastin.  He also notes increased stamina since he has been on Prolastin. He continues to use Symbicort and as needed albuterol mostly during seasonal change.  He forgets to take his Symbicort second dose of the day frequently.  Dyspnea only occurs with maximal/heavy effort.  No wheezing at all.  No cough, sputum production or hemoptysis.  No fevers, chills or sweats.  No orthopnea or paroxysmal nocturnal dyspnea.  No lower extremity edema or calf tenderness.  No chest pain. Overall he feels well and looks well.    Review of Systems A 10 point review of systems was performed and it is as noted above otherwise negative.  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   Type 2 diabetes mellitus with diabetic chronic kidney disease ([place]) 12/05/2008   Essential (primary) hypertension 12/05/2008   GERD (gastroesophageal reflux disease) 12/05/2008   Panic disorder 08/11/2007   Old myocardial infarct 02/08/2007   HLD (hyperlipidemia) 03/24/2006   Abnormal LFTs 08/10/2005   CAD (coronary artery disease) 08/11/2003   Social History   Tobacco Use   Smoking status: Former    Packs/day: 1.50    Years: 20.00    Total pack years: 30.00    Types: Cigarettes    Quit date: 08/10/2006    Years since quitting: 15.9   Smokeless tobacco: Never  Substance Use Topics   Alcohol use: No    Alcohol/week: 0.0 standard drinks of alcohol   Allergies  Allergen Reactions   Aleve  [Naproxen Sodium]    Mobic  [Meloxicam]    Voltaren [Diclofenac Sodium] Nausea And Vomiting   Current Meds  Medication Sig   alpha-1-proteinase inhibitor, human, (PROLASTIN-C) 1000 MG/20ML SOLN Inject 60 mg/kg into the vein once a week.   ascorbic acid (VITAMIN C) 500 MG tablet Take by mouth.   budesonide-formoterol (SYMBICORT) 160-4.5 MCG/ACT inhaler Inhale 2 puffs into the lungs 2 (two) times daily.   clopidogrel (PLAVIX) 75 MG tablet TAKE ONE TABLET BY MOUTH ONE TIME DAILY   Coenzyme Q10 (COQ10) 200 MG CAPS Take 1 capsule by mouth 2 (two) times daily.   Continuous Blood Gluc Sensor (FREESTYLE LIBRE  [place]) MISC APPLY ONE SENSOR TO THE BACK OF YOUR UPPER ARM. REPLACE EVERY 14 DAYS.   Cyanocobalamin (VITAMIN B 12) 250 MCG LOZG Take by mouth.   fluticasone (FLONASE) 50 MCG/ACT nasal spray Place into the nose.   HYDROcodone bit-homatropine (HYCODAN) 5-1.5 MG/5ML syrup Take 5 mLs by mouth every 8 (eight) hours as needed for cough.   ibuprofen (ADVIL) 800 MG tablet Take 1 tablet (800 mg total) by mouth every 8 (eight) hours as needed.   insulin regular human CONCENTRATED (HUMULIN R) 500 UNIT/ML injection Use up to 250  units (as calculated by U100 syringe) daily via insulin pump   ketoconazole (NIZORAL) 2 % shampoo    LORazepam (ATIVAN) 0.5 MG tablet Take by mouth. 1-2 every 6 hours as needed   metFORMIN (GLUCOPHAGE) 500 MG tablet Take 500 mg by mouth 2 (two) times daily.    Multiple Vitamin (MULTIVITAMIN) tablet Take 1 tablet by mouth daily.   Omega-3 Fatty Acids (FISH OIL) 1000 MG CAPS Take 1 capsule by mouth 2 (two) times daily.    OVER THE COUNTER MEDICATION Take 600 mg by mouth in the morning and at bedtime. Beet Root   pantoprazole (PROTONIX) 40 MG tablet TAKE ONE TABLET BY MOUTH ONE TIME DAILY   rosuvastatin (CRESTOR) 40 MG tablet TAKE ONE TABLET BY MOUTH ONE TIME DAILY   sertraline (ZOLOFT) 50 MG tablet TAKE ONE TABLET BY MOUTH ONE TIME DAILY   tamsulosin (FLOMAX) 0.4 MG CAPS capsule Take 1 capsule (0.4 mg total) by mouth daily.   telmisartan (MICARDIS) 40 MG tablet Take 40 mg by mouth daily.   Immunization History  Administered Date(s) Administered   Hep A / Hep B 04/06/2019, 05/04/2019   Influenza,inj,Quad PF,6+ Mos 04/20/2018, 04/10/2019   Influenza-Unspecified 04/28/2017   Pneumococcal Polysaccharide-23 07/23/2011   Tdap 07/23/2011   Zoster Recombinat (Shingrix) 04/20/2018, 06/22/2018        Objective:   Physical Exam BP 122/80 (BP Location: Left Arm, Cuff Size: Large)   Pulse 78   Temp 98 F (36.7 C)   Ht '5\' 10"'$  (1.778 m)   Wt 243 lb 12.8 oz (110.6 kg)   SpO2 98%   BMI 34.98 kg/m  GENERAL: Overweight gentleman, fully ambulatory, no distress, no conversational dyspnea.   HEAD: Normocephalic, atraumatic.  EYES: Pupils equal, round, reactive to light.  No scleral icterus.  MOUTH: Oral mucosa moist, no thrush.  Dentition intact.   NECK: Supple. No thyromegaly. No nodules. No JVD.  Trachea midline, phonation normal. PULMONARY: Lungs with coarse breath sounds throughout.  Mild end expiratory wheezing noted. CARDIOVASCULAR: S1 and S2. Regular rate and rhythm.  Grade 1/6 to 2/6  systolic ejection murmur consistent with mitral regurgitation. GASTROINTESTINAL: Benign. MUSCULOSKELETAL: No joint swelling, no clubbing, no edema.  NEUROLOGIC: Awake alert, no focal deficits. SKIN: Intact,warm,dry.  No rashes noted on limited exam. PSYCH: Mood and behavior normal.      Assessment & Plan:     ICD-10-CM   1. AAT (alpha-1-antitrypsin) deficiency (HCC)  E88.01    Phenotype SZ with deficiency documented Continue Prolastin weekly Patient doing well    2. Asthma-COPD overlap syndrome  J44.89    Switch to Trelegy Ellipta 100, 1 puff daily Hopefully this will help with compliance    3. Other cirrhosis of liver ([place])  K74.69    Needs to follow-up with Dr. Vicente Males, GI    4. Coronary artery disease involving native coronary artery of native heart without angina pectoris  I25.10 Ambulatory referral  to Cardiology   Patient's neurologist from [facility] clinic has retired Will refer to [facility]     Orders Placed This Encounter  Procedures   Ambulatory referral to Cardiology    Referral Priority:   Routine    Referral Type:   Consultation    Referral Reason:   Specialty Services Required    Requested Specialty:   Cardiology    Number of Visits Requested:   1   Meds ordered this encounter  Medications   Fluticasone-Umeclidin-Vilant (TRELEGY ELLIPTA) 100-62.5-25 MCG/ACT AEPB    Sig: Inhale 1 puff into the lungs daily.    Dispense:  14 each    Refill:  0    Order Specific Question:   Lot Number?    Answer:   [id number]    Order Specific Question:   Expiration Date?    Answer:   11/09/2023    Order Specific Question:   Quantity    Answer:   1   Fluticasone-Umeclidin-Vilant (TRELEGY ELLIPTA) 100-62.5-25 MCG/ACT AEPB    Sig: Inhale 1 puff into the lungs daily.    Dispense:  28 each    Refill:  11   albuterol (VENTOLIN HFA) 108 (90 Base) MCG/ACT inhaler    Sig: Inhale 2 puffs into the lungs every 6 (six) hours as needed for wheezing or shortness of breath.     Dispense:  8 g    Refill:  2   Patient was instructed to discontinue Symbicort.  Will switch him to Trelegy Ellipta 100, 1 inhalation daily.  Patient also has been prescribed albuterol for as needed use.  We will see him in follow-up in 4 to 6 months time he is to contact us prior to that time should any new difficulties arise.  Renold Don, MD Advanced Bronchoscopy PCCM [facility]    *This note was dictated using voice recognition software/Dragon.  Despite best efforts to proofread, errors can occur which can change the meaning. Any transcriptional errors that result from this process are unintentional and may not be fully corrected at the time of dictation.

## 2022-09-02 ENCOUNTER — Other Ambulatory Visit: Payer: Self-pay | Admitting: Family Medicine

## 2022-09-02 DIAGNOSIS — K219 Gastro-esophageal reflux disease without esophagitis: Secondary | ICD-10-CM

## 2022-09-03 ENCOUNTER — Ambulatory Visit (INDEPENDENT_AMBULATORY_CARE_PROVIDER_SITE_OTHER): Payer: BC Managed Care – PPO | Admitting: Gastroenterology

## 2022-09-03 ENCOUNTER — Encounter: Payer: Self-pay | Admitting: Gastroenterology

## 2022-09-03 VITALS — BP 138/85 | HR 86 | Temp 98.3°F | Ht 70.0 in | Wt 248.2 lb

## 2022-09-03 DIAGNOSIS — K7469 Other cirrhosis of liver: Secondary | ICD-10-CM

## 2022-09-03 DIAGNOSIS — K7030 Alcoholic cirrhosis of liver without ascites: Secondary | ICD-10-CM

## 2022-09-03 DIAGNOSIS — Z23 Encounter for immunization: Secondary | ICD-10-CM | POA: Diagnosis not present

## 2022-09-03 DIAGNOSIS — R160 Hepatomegaly, not elsewhere classified: Secondary | ICD-10-CM | POA: Diagnosis not present

## 2022-09-03 DIAGNOSIS — E8801 Alpha-1-antitrypsin deficiency: Secondary | ICD-10-CM | POA: Diagnosis not present

## 2022-09-03 DIAGNOSIS — R772 Abnormality of alphafetoprotein: Secondary | ICD-10-CM

## 2022-09-03 MED ORDER — NA SULFATE-K SULFATE-MG SULF 17.5-3.13-1.6 GM/177ML PO SOLN: 354.0000 mL | Freq: Once | ORAL | 0 refills | Status: DC

## 2022-09-03 MED ORDER — SUTAB 1479-225-188 MG PO TABS: ORAL_TABLET | ORAL | 0 refills | Status: DC

## 2022-09-03 NOTE — Addendum Note (Signed)
Addended by: Wayna Chalet on: 09/03/2022 03:49 PM   Modules accepted: Orders

## 2022-09-03 NOTE — Progress Notes (Signed)
Jonathon Bellows MD, MRCP(U.K) [address]  [place]  [place], [place] [address]  Main: [phone]  Fax: [phone]   Gastroenterology Consultation  Referring Provider:     Birdie Sons, MD Primary Care Physician:  Birdie Sons, MD Primary Gastroenterologist:  Dr. Jonathon Bellows  Reason for Consultation:    Liver cirrhosis         HPI:   Alex [name] is a 57 y.o. y/o male referred for consultation & management  by Dr. Caryn Section, Kirstie Peri, MD.     He was last seen at my office back in 03/2019 where he was referred for cirrhosis of the liver.  He has interstitial lung disease and on CT scan of the chest was noted to have possible liver cirrhosis.  His father died from liver cirrhosis secondary to fatty liver disease alpha-1 antitrypsin levels were very low on evaluation at 29 liver elastography showed features of F2 and F3 fibrosis he had lost weight on advice.  He was not immune to hepatitis a and B otherwise autoimmune markers were negative.  EGD in 2020 to screen for esophageal varices showed erythema in the gastric antrum otherwise no varices were noted. 12/23/2020 had a screening colonoscopy to 7 to 9 mm polyps in the sigmoid and ascending colon was snared.  Bowel prep was unsatisfactory hence repeat colonoscopy in 6 months recommended the polyps were tubular adenoma.  He was subsequently lost to follow-up.  He follows with Dr. 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  No recent labs. 62 He is doing well, no new complaints. Denies any excess alcohol consumption.   Past Medical History:  Diagnosis Date   Allergy    Cirrhosis ([place])    Diabetes mellitus without complication ([place])    GERD (gastroesophageal reflux disease)    History of chicken pox    Myocardial infarction [facility])    08    Past Surgical History:  Procedure Laterality Date   COLONOSCOPY WITH PROPOFOL N/A 12/23/2020   Procedure: COLONOSCOPY WITH PROPOFOL;  Surgeon: Jonathon Bellows, MD;   Location: [facility] ENDOSCOPY;  Service: Gastroenterology;  Laterality: N/A;   ESOPHAGOGASTRODUODENOSCOPY (EGD) WITH PROPOFOL N/A 05/01/2019   Procedure: ESOPHAGOGASTRODUODENOSCOPY (EGD) WITH PROPOFOL;  Surgeon: Jonathon Bellows, MD;  Location: [facility] ENDOSCOPY;  Service: Gastroenterology;  Laterality: N/A;   heart artery stent  2008   KNEE SURGERY     Myocrasial Perfusion Scan  09/13/2011   Abnormal images. Not thought to be significant per Dr. Nehemiah Massed    Prior to Admission medications   Medication Sig Start Date End Date Taking? Authorizing Provider  albuterol (VENTOLIN HFA) 108 (90 Base) MCG/ACT inhaler Inhale 2 puffs into the lungs every 6 (six) hours as needed for wheezing or shortness of breath. 07/20/22   Tyler Pita, MD  alpha-1-proteinase inhibitor, human, (PROLASTIN-C) 1000 MG/20ML SOLN Inject 60 mg/kg into the vein once a week.    [provider]  ascorbic acid (VITAMIN C) 500 MG tablet Take by mouth.    [provider]  clopidogrel (PLAVIX) 75 MG tablet TAKE ONE TABLET BY MOUTH ONE TIME DAILY 08/18/21   Birdie Sons, MD  Coenzyme Q10 (COQ10) 200 MG CAPS Take 1 capsule by mouth 2 (two) times daily.    [provider]  Continuous Blood Gluc Sensor (FREESTYLE LIBRE 14 DAY SENSOR) MISC APPLY ONE SENSOR TO THE BACK OF YOUR UPPER ARM. REPLACE EVERY 14 DAYS. 11/22/20   [provider]  Cyanocobalamin (VITAMIN  B 12) 250 MCG LOZG Take by mouth.    [provider]  fluticasone (FLONASE) 50 MCG/ACT nasal spray Place into the nose. 08/31/13   [provider]  Fluticasone-Umeclidin-Vilant (TRELEGY ELLIPTA) 100-62.5-25 MCG/ACT AEPB Inhale 1 puff into the lungs daily. 07/20/22   Tyler Pita, MD  Fluticasone-Umeclidin-Vilant (TRELEGY ELLIPTA) 100-62.5-25 MCG/ACT AEPB Inhale 1 puff into the lungs daily. 07/20/22   Tyler Pita, MD  HYDROcodone bit-homatropine (HYCODAN) 5-1.5 MG/5ML syrup Take 5 mLs by mouth every 8 (eight) hours as needed  for cough. 09/17/21   Birdie Sons, MD  ibuprofen (ADVIL) 800 MG tablet Take 1 tablet (800 mg total) by mouth every 8 (eight) hours as needed. 07/29/21   Mikey Kirschner, PA-C  insulin regular human CONCENTRATED (HUMULIN R) 500 UNIT/ML injection Use up to 250 units (as calculated by U100 syringe) daily via insulin pump 02/16/17   [provider]  ketoconazole (NIZORAL) 2 % shampoo  07/18/20   [provider]  LORazepam (ATIVAN) 0.5 MG tablet Take by mouth. 1-2 every 6 hours as needed 07/13/13   [provider]  metFORMIN (GLUCOPHAGE) 500 MG tablet Take 500 mg by mouth 2 (two) times daily.     [provider]  Multiple Vitamin (MULTIVITAMIN) tablet Take 1 tablet by mouth daily.    [provider]  Omega-3 Fatty Acids (FISH OIL) 1000 MG CAPS Take 1 capsule by mouth 2 (two) times daily.  12/05/08   [provider]  OVER THE COUNTER MEDICATION Take 600 mg by mouth in the morning and at bedtime. Beet Root    [provider]  pantoprazole (PROTONIX) 40 MG tablet TAKE ONE TABLET BY MOUTH ONE TIME DAILY 12/22/21   Birdie Sons, MD  rosuvastatin (CRESTOR) 40 MG tablet TAKE ONE TABLET BY MOUTH ONE TIME DAILY 12/26/21   Birdie Sons, MD  sertraline (ZOLOFT) 50 MG tablet TAKE ONE TABLET BY MOUTH ONE TIME DAILY 12/26/21   Birdie Sons, MD  tamsulosin (FLOMAX) 0.4 MG CAPS capsule Take 1 capsule (0.4 mg total) by mouth daily. 10/22/20   Harvest Dark, MD  telmisartan (MICARDIS) 40 MG tablet Take 40 mg by mouth daily. 12/07/20   [provider]    Family History  Problem Relation Age of Onset   Hypertension Mother    Diabetes Mother        insulin dependent   Hyperlipidemia Mother    Diabetes Other    Hypertension Other    Heart disease Other    Arthritis Other      Social History   Tobacco Use   Smoking status: Former    Packs/day: 1.50    Years: 20.00    Total pack years: 30.00    Types: Cigarettes    Quit date:  08/10/2006    Years since quitting: 16.0   Smokeless tobacco: Never  Substance Use Topics   Alcohol use: No    Alcohol/week: 0.0 standard drinks of alcohol   Drug use: No    Allergies as of 09/03/2022 - Review Complete 09/03/2022  Allergen Reaction Noted   Aleve  [naproxen sodium]  01/29/2015   Mobic  [meloxicam]  01/29/2015   Voltaren [diclofenac sodium] Nausea And Vomiting 01/29/2015    Review of Systems:    All systems reviewed and negative except where noted in HPI.   Physical Exam:  BP 138/85   Pulse 86   Temp 98.3 F (36.8 C) (Oral)   Ht '5\' 10"'$  (1.778 m)  Wt 248 lb 3.2 oz (112.6 kg)   BMI 35.61 kg/m  No LMP for male patient. Psych:  Alert and cooperative. Normal mood and affect. General:   Alert,  Well-developed, well-nourished, pleasant and cooperative in NAD Head:  Normocephalic and atraumatic. Eyes:  Sclera clear, no icterus.   Conjunctiva pink. Neurologic:  Alert and oriented x3;  grossly normal neurologically. Psych:  Alert and cooperative. Normal mood and affect.  Imaging Studies: No results found.  Assessment and Plan:   Alex [name] is a 57 y.o. y/o male has been referred for liver cirrhosis follow-up lost to follow-up for over 3 years.  Has alpha and antitrypsin 1 deficiency.  Prior colonoscopy was incomplete due to poor prep.  He is overdue for surveillance of esophageal varices and for [place] screening.   Plan 1.  EGD to screen for esophageal varices, colonoscopy due to poor prep.  Sleep. 2.  AFP and right upper quadrant ultrasound 3.  Hepatitis a and B vaccination 4.  CBC, CMP, AFP, INR to calculate MELD score.   I have discussed alternative options, risks & benefits,  which include, but are not limited to, bleeding, infection, perforation,respiratory complication & drug reaction.  The patient agrees with this plan & written consent will be obtained.    Follow up in 6 months  Dr Jonathon Bellows MD,MRCP(U.K)

## 2022-09-08 LAB — COMPREHENSIVE METABOLIC PANEL
ALT: 61 IU/L — ABNORMAL HIGH (ref 0–44)
AST: 58 IU/L — ABNORMAL HIGH (ref 0–40)
Albumin/Globulin Ratio: 1.8 (ref 1.2–2.2)
Albumin: 4.4 g/dL (ref 3.8–4.9)
Alkaline Phosphatase: 90 IU/L (ref 44–121)
BUN/Creatinine Ratio: 11 (ref 9–20)
BUN: 13 mg/dL (ref 6–24)
Bilirubin Total: 0.5 mg/dL (ref 0.0–1.2)
CO2: 23 mmol/L (ref 20–29)
Calcium: 9.7 mg/dL (ref 8.7–10.2)
Chloride: 102 mmol/L (ref 96–106)
Creatinine, Ser: 1.14 mg/dL (ref 0.76–1.27)
Globulin, Total: 2.5 g/dL (ref 1.5–4.5)
Glucose: 175 mg/dL — ABNORMAL HIGH (ref 70–99)
Potassium: 4.6 mmol/L (ref 3.5–5.2)
Sodium: 139 mmol/L (ref 134–144)
Total Protein: 6.9 g/dL (ref 6.0–8.5)
eGFR: 76 mL/min/{1.73_m2} (ref >59)

## 2022-09-08 LAB — AFP TUMOR MARKER: AFP, Serum, Tumor Marker: 31.2 ng/mL — ABNORMAL HIGH (ref 0.0–8.4)

## 2022-09-08 LAB — CBC WITH DIFFERENTIAL/PLATELET
Basophils Absolute: 0.1 10*3/uL (ref 0.0–0.2)
Basos: 1 %
EOS (ABSOLUTE): 0.1 10*3/uL (ref 0.0–0.4)
Eos: 2 %
Hematocrit: 44.1 % (ref 37.5–51.0)
Hemoglobin: 14.6 g/dL (ref 13.0–17.7)
Immature Grans (Abs): 0 10*3/uL (ref 0.0–0.1)
Immature Granulocytes: 0 %
Lymphocytes Absolute: 1.9 10*3/uL (ref 0.7–3.1)
Lymphs: 25 %
MCH: 30.2 pg (ref 26.6–33.0)
MCHC: 33.1 g/dL (ref 31.5–35.7)
MCV: 91 fL (ref 79–97)
Monocytes Absolute: 0.6 10*3/uL (ref 0.1–0.9)
Monocytes: 8 %
Neutrophils Absolute: 5 10*3/uL (ref 1.4–7.0)
Neutrophils: 64 %
Platelets: 215 10*3/uL (ref 150–450)
RBC: 4.83 x10E6/uL (ref 4.14–5.80)
RDW: 13.2 % (ref 11.6–15.4)
WBC: 7.8 10*3/uL (ref 3.4–10.8)

## 2022-09-08 LAB — PROTIME-INR
INR: 1.1 (ref 0.9–1.2)
Prothrombin Time: 11.5 s (ref 9.1–12.0)

## 2022-09-08 NOTE — Progress Notes (Signed)
AFP elevated can cancel ultrasound and get MRI liver please

## 2022-09-09 ENCOUNTER — Other Ambulatory Visit: Payer: BC Managed Care – PPO

## 2022-09-09 NOTE — Addendum Note (Signed)
Addended by: Wayna Chalet on: 09/09/2022 09:26 AM   Modules accepted: Orders

## 2022-09-10 ENCOUNTER — Ambulatory Visit: Payer: BC Managed Care – PPO

## 2022-09-10 NOTE — Telephone Encounter (Signed)
Yes it would be a good aleternative option. Ideally MRI would be best but if we can't this is our next best option

## 2022-09-14 NOTE — Addendum Note (Signed)
Addended by: Wayna Chalet on: 09/14/2022 10:38 AM   Modules accepted: Orders

## 2022-09-18 ENCOUNTER — Ambulatory Visit: Payer: BC Managed Care – PPO

## 2022-09-21 ENCOUNTER — Ambulatory Visit
Admission: RE | Admit: 2022-09-21 | Discharge: 2022-09-21 | Disposition: A | Discharge status: Home / Self Care | Payer: BC Managed Care – PPO | Source: Ambulatory Visit | Attending: Gastroenterology | Admitting: Gastroenterology

## 2022-09-21 DIAGNOSIS — K7469 Other cirrhosis of liver: Secondary | ICD-10-CM | POA: Insufficient documentation

## 2022-09-21 DIAGNOSIS — K7460 Unspecified cirrhosis of liver: Secondary | ICD-10-CM | POA: Diagnosis not present

## 2022-09-21 DIAGNOSIS — R160 Hepatomegaly, not elsewhere classified: Secondary | ICD-10-CM | POA: Insufficient documentation

## 2022-09-21 DIAGNOSIS — R161 Splenomegaly, not elsewhere classified: Secondary | ICD-10-CM | POA: Diagnosis not present

## 2022-09-21 DIAGNOSIS — K8020 Calculus of gallbladder without cholecystitis without obstruction: Secondary | ICD-10-CM | POA: Diagnosis not present

## 2022-09-21 DIAGNOSIS — N200 Calculus of kidney: Secondary | ICD-10-CM | POA: Diagnosis not present

## 2022-09-21 MED ORDER — IOHEXOL 300 MG/ML  SOLN
100.0000 mL | Freq: Once | INTRAMUSCULAR | Status: AC | PRN
  Administered 2022-09-21: 100 mL via INTRAVENOUS

## 2022-09-24 ENCOUNTER — Encounter: Payer: Self-pay | Admitting: Gastroenterology

## 2022-09-24 NOTE — Telephone Encounter (Signed)
Alex [name]  I can see that he is had the CT scan but I cannot see the report can we check with radiology to find out about the status of the report

## 2022-09-25 ENCOUNTER — Other Ambulatory Visit: Payer: Self-pay

## 2022-09-25 DIAGNOSIS — K7030 Alcoholic cirrhosis of liver without ascites: Secondary | ICD-10-CM

## 2022-09-25 NOTE — Progress Notes (Signed)
I sent pt message that there is nothing new in the liver- repeat [facility] liver in 6 months

## 2022-09-29 DIAGNOSIS — Z794 Long term (current) use of insulin: Secondary | ICD-10-CM | POA: Diagnosis not present

## 2022-09-29 DIAGNOSIS — R809 Proteinuria, unspecified: Secondary | ICD-10-CM | POA: Diagnosis not present

## 2022-09-29 DIAGNOSIS — E1159 Type 2 diabetes mellitus with other circulatory complications: Secondary | ICD-10-CM | POA: Diagnosis not present

## 2022-09-29 DIAGNOSIS — Z9641 Presence of insulin pump (external) (internal): Secondary | ICD-10-CM | POA: Diagnosis not present

## 2022-09-29 DIAGNOSIS — E1129 Type 2 diabetes mellitus with other diabetic kidney complication: Secondary | ICD-10-CM | POA: Diagnosis not present

## 2022-09-29 LAB — HEMOGLOBIN A1C: Hemoglobin A1C: 6.4

## 2022-12-07 NOTE — Progress Notes (Unsigned)
Cardiology Office Note  Date:  12/08/2022   ID:  Alex [name] [birth year], MRN [id number]  PCP:  Alex Limes, MD   Chief Complaint  Patient presents with   New Patient (Initial Visit)    Ref by Dr. Sherrie [name] to establish care for CAD; former Dr. Arnoldo [name] patient. 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 Medications reviewed by the patient verbally.     HPI:  [name] is a 57 year old gentleman with past medical history of cirrhosis of liver without ascites Alpha-1-antitrypsin deficiency  Asthma/COPD Coronary artery disease, PCI and stent placement of LAD 03/2007  Smoker, quit 2008 PAD, aortic atherosclerosis Diabetes type II on insulin Carotid stenosis Who presents by referral from Dr. Jayme [name] for coronary artery disease Previously followed by Alex [name]  Discussed prior cardiac history Prior anginal sx 2008, posterior shoulder pain, felt tired Went to the ER, went to [facility], PCI LAD  No intervention since that time is done well Active at the house, no regular exercise program  Dr. Tobi [name] watching liver  Lab work reviewed A1C 6.9 Total chol 138, LDL 75  EKG personally reviewed by myself on todays visit NSR rate 71 no ST or T wave changes  PMH:   has a past medical history of Allergy, Cirrhosis (HCC), Diabetes mellitus without complication (HCC), GERD (gastroesophageal reflux disease), History of chicken pox, and Myocardial infarction (HCC).  PSH:    Past Surgical History:  Procedure Laterality Date   COLONOSCOPY WITH PROPOFOL N/A 12/23/2020   Procedure: COLONOSCOPY WITH PROPOFOL;  Surgeon: Alex Mood, MD;  Location: [facility] ENDOSCOPY;  Service: Gastroenterology;  Laterality: N/A;   ESOPHAGOGASTRODUODENOSCOPY (EGD) WITH PROPOFOL N/A 05/01/2019   Procedure: ESOPHAGOGASTRODUODENOSCOPY (EGD) WITH PROPOFOL;  Surgeon: Alex Mood, MD;  Location: [facility] ENDOSCOPY;  Service: Gastroenterology;  Laterality: N/A;   heart artery stent  2008   KNEE  SURGERY     Myocrasial Perfusion Scan  09/13/2011   Abnormal images. Not thought to be significant per Dr. Gwen [name]    Current Outpatient Medications  Medication Sig Dispense Refill   albuterol (VENTOLIN HFA) 108 (90 Base) MCG/ACT inhaler Inhale 2 puffs into the lungs every 6 (six) hours as needed for wheezing or shortness of breath. 8 g 2   alpha-1-proteinase inhibitor, human, (PROLASTIN-C) 1000 MG/20ML SOLN Inject 60 mg/kg into the vein once a week.     ascorbic acid (VITAMIN C) 500 MG tablet Take by mouth.     clopidogrel (PLAVIX) 75 MG tablet TAKE ONE TABLET BY MOUTH ONE TIME DAILY 90 tablet 4   Coenzyme Q10 (COQ10) 200 MG CAPS Take 1 capsule by mouth 2 (two) times daily.     Cyanocobalamin (VITAMIN B 12) 250 MCG LOZG Take by mouth.     fluticasone (FLONASE) 50 MCG/ACT nasal spray Place into the nose.     Fluticasone-Umeclidin-Vilant (TRELEGY ELLIPTA) 100-62.5-25 MCG/ACT AEPB Inhale 1 puff into the lungs daily. 28 each 11   insulin regular human CONCENTRATED (HUMULIN R) 500 UNIT/ML injection Use up to 250 units (as calculated by U100 syringe) daily via insulin pump     ketoconazole (NIZORAL) 2 % shampoo      LORazepam (ATIVAN) 0.5 MG tablet Take by mouth. 1-2 every 6 hours as needed     metFORMIN (GLUCOPHAGE) 500 MG tablet Take 500 mg by mouth 2 (two) times daily.      Multiple Vitamin (MULTIVITAMIN) tablet Take 1 tablet by mouth daily.     Omega-3 Fatty Acids (FISH  OIL) 1000 MG CAPS Take 1 capsule by mouth 2 (two) times daily.      OVER THE COUNTER MEDICATION Take 600 mg by mouth in the morning and at bedtime. Beet Root     pantoprazole (PROTONIX) 40 MG tablet TAKE ONE TABLET BY MOUTH ONE TIME DAILY 90 tablet 2   rosuvastatin (CRESTOR) 40 MG tablet TAKE ONE TABLET BY MOUTH ONE TIME DAILY 90 tablet 4   sertraline (ZOLOFT) 50 MG tablet TAKE ONE TABLET BY MOUTH ONE TIME DAILY 90 tablet 2   tamsulosin (FLOMAX) 0.4 MG CAPS capsule Take 1 capsule (0.4 mg total) by mouth daily. 30 capsule 0    telmisartan (MICARDIS) 40 MG tablet Take 40 mg by mouth daily.     Continuous Blood Gluc Sensor (FREESTYLE LIBRE 14 DAY SENSOR) MISC APPLY ONE SENSOR TO THE BACK OF YOUR UPPER ARM. REPLACE EVERY 14 DAYS. (Patient not taking: Reported on 12/08/2022)     No current facility-administered medications for this visit.    Allergies:   Aleve  [naproxen sodium], Mobic  [meloxicam], and Voltaren [diclofenac sodium]   Social History:  The patient  reports that he quit smoking about 16 years ago. His smoking use included cigarettes. He has a 30.00 pack-year smoking history. He has never used smokeless tobacco. He reports that he does not drink alcohol and does not use drugs.   Family History:   family history includes Arthritis in an other family member; Cancer in his mother; Cirrhosis in his father; Diabetes in his mother and another family member; Heart disease in an other family member; Hyperlipidemia in his mother; Hypertension in his mother and another family member.    Review of Systems: Review of Systems  Constitutional: Negative.   HENT: Negative.    Respiratory: Negative.    Cardiovascular: Negative.   Gastrointestinal: Negative.   Musculoskeletal: Negative.   Neurological: Negative.   Psychiatric/Behavioral: Negative.    All other systems reviewed and are negative.    PHYSICAL EXAM: VS:  BP 130/80 (BP Location: Right Arm, Patient Position: Sitting, Cuff Size: Normal)   Pulse 71   Ht 5\' 10"  (1.778 m)   Wt 243 lb 4 oz (110.3 kg)   SpO2 98%   BMI 34.90 kg/m  , BMI Body mass index is 34.9 kg/m. GEN: Well nourished, well developed, in no acute distress HEENT: normal Neck: no JVD, carotid bruits, or masses Cardiac: RRR; no murmurs, rubs, or gallops,no edema  Respiratory:  clear to auscultation bilaterally, normal work of breathing GI: soft, nontender, nondistended, + BS MS: no deformity or atrophy Skin: warm and dry, no rash Neuro:  Strength and sensation are intact Psych:  euthymic [name], full affect  Recent Labs: 09/03/2022: ALT 61; BUN 13; Creatinine, Ser 1.14; Hemoglobin 14.6; Platelets 215; Potassium 4.6; Sodium 139    Lipid Panel Lab Results  Component Value Date   CHOL 136 05/23/2021   HDL 36 05/23/2021   LDLCALC 71 05/23/2021   TRIG 148 05/23/2021      Wt Readings from Last 3 Encounters:  12/08/22 243 lb 4 oz (110.3 kg)  09/03/22 248 lb 3.2 oz (112.6 kg)  07/20/22 243 lb 12.8 oz (110.6 kg)      ASSESSMENT AND PLAN:  Problem List Items Addressed This Visit       Cardiology Problems   CAD (coronary artery disease) - Primary   Aortic atherosclerosis (HCC)   HLD (hyperlipidemia)   Essential (primary) hypertension     Other   AAT (alpha-1-antitrypsin)  deficiency (HCC) (Chronic)   Cirrhosis of liver (HCC)   CAD with chronic stable angina Prior stenting to LAD 2008 Stop smoking at that time, cholesterol well-controlled, no significant diabetes Denies anginal symptoms, no further testing ordered at this time  Hyperlipidemia Cholesterol is at goal on the current lipid regimen. No changes to the medications were made.  Aortic atherosclerosis Former smoker Risk factors relatively well-controlled  Alpha-1 antitrypsin/cirrhosis Followed by GI and pulmonary  Essential hypertension Blood pressure is well controlled on today's visit. No changes made to the medications.     Total encounter time more than 60 minutes  Greater than 50% was spent in counseling and coordination of care with the patient    Signed, Dossie Arbour, M.D., Ph.D. [facility] [place], [place] [phone]

## 2022-12-08 ENCOUNTER — Encounter: Payer: Self-pay | Admitting: Cardiovascular Disease

## 2022-12-08 ENCOUNTER — Ambulatory Visit: Payer: BC Managed Care – PPO | Attending: Cardiovascular Disease | Admitting: Cardiovascular Disease

## 2022-12-08 VITALS — BP 130/80 | HR 71 | Ht 70.0 in | Wt 243.2 lb

## 2022-12-08 DIAGNOSIS — E782 Mixed hyperlipidemia: Secondary | ICD-10-CM | POA: Diagnosis not present

## 2022-12-08 DIAGNOSIS — I25118 Atherosclerotic heart disease of native coronary artery with other forms of angina pectoris: Secondary | ICD-10-CM

## 2022-12-08 DIAGNOSIS — E8801 Alpha-1-antitrypsin deficiency: Secondary | ICD-10-CM

## 2022-12-08 DIAGNOSIS — I700 Atherosclerosis of aorta: Secondary | ICD-10-CM

## 2022-12-08 DIAGNOSIS — I2510 Atherosclerotic heart disease of native coronary artery without angina pectoris: Secondary | ICD-10-CM

## 2022-12-08 DIAGNOSIS — K7030 Alcoholic cirrhosis of liver without ascites: Secondary | ICD-10-CM

## 2022-12-08 DIAGNOSIS — I10 Essential (primary) hypertension: Secondary | ICD-10-CM

## 2022-12-08 MED ORDER — TELMISARTAN 40 MG PO TABS: 40.0000 mg | ORAL_TABLET | Freq: Every day | ORAL | 3 refills | Status: DC

## 2022-12-08 MED ORDER — ROSUVASTATIN CALCIUM 40 MG PO TABS
40.0000 mg | ORAL_TABLET | Freq: Every day | ORAL | 3 refills | Status: DC
Start: 2022-12-08 — End: 2023-01-28

## 2022-12-08 MED ORDER — CLOPIDOGREL BISULFATE 75 MG PO TABS: 75.0000 mg | ORAL_TABLET | Freq: Every day | ORAL | 3 refills | Status: DC

## 2022-12-08 NOTE — Patient Instructions (Addendum)
Medication Instructions:  No changes  If you need a refill on your cardiac medications before your next appointment, please call your pharmacy.    Lab work: No new labs needed   Testing/Procedures: No new testing needed   Follow-Up: At [facility], you and your health needs are our priority.  As part of our continuing mission to provide you with exceptional heart care, we have created designated Provider Care Teams.  These Care Teams include your primary Cardiologist (physician) and Advanced Practice Providers (APPs -  Physician Assistants and Nurse Practitioners) who all work together to provide you with the care you need, when you need it.  You will need a follow up appointment in 6 months  Providers on your designated Care Team:   Christopher Berge, NP Ryan Dunn, PA-C Cadence Furth, PA-C  COVID-19 Vaccine Information can be found at: [email or web address] For questions related to vaccine distribution or appointments, please email [email or web address] or call [phone].   

## 2023-01-14 ENCOUNTER — Other Ambulatory Visit: Payer: Self-pay | Admitting: Family Medicine

## 2023-01-14 DIAGNOSIS — F410 Panic disorder [episodic paroxysmal anxiety] without agoraphobia: Secondary | ICD-10-CM

## 2023-01-14 NOTE — Telephone Encounter (Signed)
Requested Prescriptions  Pending Prescriptions Disp Refills   sertraline (ZOLOFT) 50 MG tablet [Pharmacy Med Name: SERTRALINE 50 MG TAB[*]] 30 tablet 2    Sig: TAKE ONE TABLET BY MOUTH ONE TIME DAILY     Psychiatry:  Antidepressants - SSRI - sertraline Failed - 01/14/2023 12:02 PM      Failed - AST in normal range and within 360 days    AST  Date Value Ref Range Status  09/03/2022 58 (H) 0 - 40 IU/L Final         Failed - ALT in normal range and within 360 days    ALT  Date Value Ref Range Status  09/03/2022 61 (H) 0 - 44 IU/L Final         Failed - Completed PHQ-2 or PHQ-9 in the last 360 days      Failed - Valid encounter within last 6 months    Recent Outpatient Visits           1 year ago Acute bacterial sinusitis   [place] Malva Limes, MD   1 year ago Upper back strain, initial encounter   [facility] Alfredia Ferguson, PA-C   2 years ago Panic disorder   [facility] Malva Limes, MD   3 years ago Essential (primary) hypertension   [facility] Malva Limes, MD   4 years ago Bronchitis   [facility] Malva Limes, MD       Future Appointments             In 1 month Fisher, Demetrios Isaacs, MD [facility]

## 2023-01-28 ENCOUNTER — Other Ambulatory Visit: Payer: Self-pay | Admitting: Family Medicine

## 2023-01-28 DIAGNOSIS — I2510 Atherosclerotic heart disease of native coronary artery without angina pectoris: Secondary | ICD-10-CM

## 2023-02-04 DIAGNOSIS — Z85828 Personal history of other malignant neoplasm of skin: Secondary | ICD-10-CM | POA: Diagnosis not present

## 2023-02-04 DIAGNOSIS — D2262 Melanocytic nevi of left upper limb, including shoulder: Secondary | ICD-10-CM | POA: Diagnosis not present

## 2023-02-04 DIAGNOSIS — L728 Other follicular cysts of the skin and subcutaneous tissue: Secondary | ICD-10-CM | POA: Diagnosis not present

## 2023-02-04 DIAGNOSIS — L720 Epidermal cyst: Secondary | ICD-10-CM | POA: Diagnosis not present

## 2023-02-04 DIAGNOSIS — R208 Other disturbances of skin sensation: Secondary | ICD-10-CM | POA: Diagnosis not present

## 2023-02-04 DIAGNOSIS — L538 Other specified erythematous conditions: Secondary | ICD-10-CM | POA: Diagnosis not present

## 2023-02-04 DIAGNOSIS — D2272 Melanocytic nevi of left lower limb, including hip: Secondary | ICD-10-CM | POA: Diagnosis not present

## 2023-02-04 DIAGNOSIS — D225 Melanocytic nevi of trunk: Secondary | ICD-10-CM | POA: Diagnosis not present

## 2023-02-09 ENCOUNTER — Encounter: Payer: Self-pay | Admitting: Pulmonary Disease

## 2023-02-10 NOTE — Telephone Encounter (Signed)
Pharmacy team, patient is saying [facility] pharmacy is needing a prior-authorization on his Prolastin. Can you help with this? Thank You!

## 2023-02-15 ENCOUNTER — Other Ambulatory Visit: Payer: Self-pay | Admitting: Family Medicine

## 2023-02-15 DIAGNOSIS — F410 Panic disorder [episodic paroxysmal anxiety] without agoraphobia: Secondary | ICD-10-CM

## 2023-02-16 NOTE — Telephone Encounter (Signed)
Requested medication (s) are due for refill today: no refills  Requested medication (s) are on the active medication list: yes   Last refill:  01/14/23 #30 0 refills  Future visit scheduled: yes tomorrow  Notes to clinic:   protocol failed. Last OV greater than 1 year. Courtesy refill already given. Do you want to refill today or after OV tomorrow?     Requested Prescriptions  Pending Prescriptions Disp Refills   sertraline (ZOLOFT) 50 MG tablet [Pharmacy Med Name: SERTRALINE 50 MG TAB[*]] 90 tablet 2    Sig: TAKE ONE TABLET BY MOUTH ONE TIME DAILY     Psychiatry:  Antidepressants - SSRI - sertraline Failed - 02/15/2023  1:41 PM      Failed - AST in normal range and within 360 days    AST  Date Value Ref Range Status  09/03/2022 58 (H) 0 - 40 IU/L Final         Failed - ALT in normal range and within 360 days    ALT  Date Value Ref Range Status  09/03/2022 61 (H) 0 - 44 IU/L Final         Failed - Completed PHQ-2 or PHQ-9 in the last 360 days      Failed - Valid encounter within last 6 months    Recent Outpatient Visits           1 year ago Acute bacterial sinusitis   [facility] Malva Limes, MD   1 year ago Upper back strain, initial encounter   [facility] Alfredia Ferguson, PA-C   2 years ago Panic disorder   [facility] Malva Limes, MD   3 years ago Essential (primary) hypertension   [place] [facility] Malva Limes, MD   4 years ago Bronchitis   [facility] Malva Limes, MD       Future Appointments             Tomorrow Sherrie Mustache, Demetrios Isaacs, MD [facility], [facility]

## 2023-02-17 ENCOUNTER — Encounter: Payer: Self-pay | Admitting: Family Medicine

## 2023-02-17 ENCOUNTER — Ambulatory Visit: Payer: BC Managed Care – PPO | Admitting: Family Medicine

## 2023-02-17 VITALS — BP 136/76 | HR 73 | Temp 98.3°F | Resp 12 | Ht 70.0 in | Wt 245.0 lb

## 2023-02-17 DIAGNOSIS — E782 Mixed hyperlipidemia: Secondary | ICD-10-CM

## 2023-02-17 DIAGNOSIS — Z0000 Encounter for general adult medical examination without abnormal findings: Secondary | ICD-10-CM | POA: Diagnosis not present

## 2023-02-17 DIAGNOSIS — F410 Panic disorder [episodic paroxysmal anxiety] without agoraphobia: Secondary | ICD-10-CM | POA: Diagnosis not present

## 2023-02-17 DIAGNOSIS — Z125 Encounter for screening for malignant neoplasm of prostate: Secondary | ICD-10-CM | POA: Diagnosis not present

## 2023-02-17 DIAGNOSIS — N182 Chronic kidney disease, stage 2 (mild): Secondary | ICD-10-CM

## 2023-02-17 DIAGNOSIS — I25118 Atherosclerotic heart disease of native coronary artery with other forms of angina pectoris: Secondary | ICD-10-CM | POA: Diagnosis not present

## 2023-02-17 DIAGNOSIS — E8801 Alpha-1-antitrypsin deficiency: Secondary | ICD-10-CM

## 2023-02-17 DIAGNOSIS — F419 Anxiety disorder, unspecified: Secondary | ICD-10-CM

## 2023-02-17 DIAGNOSIS — K219 Gastro-esophageal reflux disease without esophagitis: Secondary | ICD-10-CM

## 2023-02-17 DIAGNOSIS — E1122 Type 2 diabetes mellitus with diabetic chronic kidney disease: Secondary | ICD-10-CM

## 2023-02-17 DIAGNOSIS — I10 Essential (primary) hypertension: Secondary | ICD-10-CM | POA: Diagnosis not present

## 2023-02-17 DIAGNOSIS — Z23 Encounter for immunization: Secondary | ICD-10-CM

## 2023-02-17 MED ORDER — LORAZEPAM 0.5 MG PO TABS
ORAL_TABLET | ORAL | 3 refills | Status: AC
Start: 2023-02-17 — End: ?

## 2023-02-17 MED ORDER — SERTRALINE HCL 50 MG PO TABS: 50.0000 mg | ORAL_TABLET | Freq: Every day | ORAL | 3 refills | Status: DC

## 2023-02-17 NOTE — Patient Instructions (Addendum)
Please review the attached list of medications and notify my office if there are any errors.   You are due for a Tdap (tetanus-diptheria-pertussis vaccine) which protects you from tetanus and whooping cough. You can get this at most pharmacies, or schedule an appointment to have it at our office.

## 2023-02-18 LAB — COMPREHENSIVE METABOLIC PANEL
ALT: 41 IU/L (ref 0–44)
AST: 46 IU/L — ABNORMAL HIGH (ref 0–40)
Albumin: 4.6 g/dL (ref 3.8–4.9)
Alkaline Phosphatase: 77 IU/L (ref 44–121)
BUN/Creatinine Ratio: 12 (ref 9–20)
BUN: 12 mg/dL (ref 6–24)
Bilirubin Total: 0.5 mg/dL (ref 0.0–1.2)
CO2: 23 mmol/L (ref 20–29)
Calcium: 9.6 mg/dL (ref 8.7–10.2)
Chloride: 102 mmol/L (ref 96–106)
Creatinine, Ser: 1.02 mg/dL (ref 0.76–1.27)
Globulin, Total: 2.5 g/dL (ref 1.5–4.5)
Glucose: 154 mg/dL — ABNORMAL HIGH (ref 70–99)
Potassium: 4.4 mmol/L (ref 3.5–5.2)
Sodium: 141 mmol/L (ref 134–144)
Total Protein: 7.1 g/dL (ref 6.0–8.5)
eGFR: 87 mL/min/{1.73_m2} (ref >59)

## 2023-02-18 LAB — LIPID PANEL WITH LDL/HDL RATIO
Cholesterol, Total: 156 mg/dL (ref 100–199)
HDL: 37 mg/dL — ABNORMAL LOW (ref >39)
LDL Chol Calc (NIH): 77 mg/dL (ref 0–99)
LDL/HDL Ratio: 2.1 ratio (ref 0.0–3.6)
Triglycerides: 258 mg/dL — ABNORMAL HIGH (ref 0–149)
VLDL Cholesterol Cal: 42 mg/dL — ABNORMAL HIGH (ref 5–40)

## 2023-02-18 LAB — PSA TOTAL (REFLEX TO FREE): Prostate Specific Ag, Serum: 0.7 ng/mL (ref 0.0–4.0)

## 2023-02-26 ENCOUNTER — Encounter: Payer: Self-pay | Admitting: Family Medicine

## 2023-03-01 NOTE — Progress Notes (Signed)
Complete physical exam   Patient: Alex [name]   DOB: 1965/08/25   57 y.o. Male  MRN: [id number] Visit Date: 02/17/2023  Today's healthcare provider: Mila Merry, MD   Chief Complaint  Patient presents with   Annual Exam   Diabetes   Hyperlipidemia   Subjective    Alex [name] is a 57 y.o. male who presents today for a complete physical exam.  He reports consuming a  regulary  diet.  He generally feels well. He reports sleeping fairly well. He does not have additional problems to discuss today.   He continues to follow up with Dr. Mariah Milling for CAD, denies chest pains or palpitations. Continues to follow up with Dr. Jayme Cloud for alpha-1 antitrypsin deficiency and taking Trelegy consistently every day. Continues to follow up with Dr. Tedd Sias for diabetes with most recent A1c of 6.4% in February and has upcoming appointment.   He continues on pantoprazole every day for GERD symptoms and reports that he has no symptoms so long as he takes it every day. Reports that sertraline is working well with no side effects for anxiety and rarely takes lorazepam.  Continues on telmisartan for hypertension which he is tolerating well.   Past Medical History:  Diagnosis Date   Allergy    Anxiety    Cirrhosis (HCC)    Diabetes mellitus without complication (HCC)    GERD (gastroesophageal reflux disease)    History of chicken pox    Hypertension    Myocardial infarction [facility])    08   Past Surgical History:  Procedure Laterality Date   COLONOSCOPY WITH PROPOFOL N/A 12/23/2020   Procedure: COLONOSCOPY WITH PROPOFOL;  Surgeon: Wyline Mood, MD;  Location: [facility] ENDOSCOPY;  Service: Gastroenterology;  Laterality: N/A;   ESOPHAGOGASTRODUODENOSCOPY (EGD) WITH PROPOFOL N/A 05/01/2019   Procedure: ESOPHAGOGASTRODUODENOSCOPY (EGD) WITH PROPOFOL;  Surgeon: Wyline Mood, MD;  Location: [facility] ENDOSCOPY;  Service: Gastroenterology;  Laterality: N/A;   heart artery stent  2008   KNEE SURGERY     Myocrasial  Perfusion Scan  09/13/2011   Abnormal images. Not thought to be significant per Dr. Gwen Pounds   Social History   Socioeconomic History   Marital status: Married    Spouse name: Not on file   Number of children: Not on file   Years of education: Not on file   Highest education level: Not on file  Occupational History   Occupation: Office manager  Tobacco Use   Smoking status: Former    Current packs/day: 0.00    Average packs/day: 1.5 packs/day for 20.0 years (30.0 ttl pk-yrs)    Types: Cigarettes    Start date: 08/10/1986    Quit date: 08/10/2006    Years since quitting: 16.5   Smokeless tobacco: Never  Vaping Use   Vaping status: Never Used  Substance and Sexual Activity   Alcohol use: No   Drug use: No   Sexual activity: Yes    Birth control/protection: None  Other Topics Concern   Not on file  Social History Narrative   Not on file   Social Determinants of Health   Financial Resource Strain: Not on file  Food Insecurity: Not on file  Transportation Needs: Not on file  Physical Activity: Not on file  Stress: Not on file  Social Connections: Not on file  Intimate Partner Violence: Not on file   Family Status  Relation Name Status   Mother Bonita Quin Deceased   Father Jerilynn Som Deceased at age 75  Sclerosis   Brother Comptroller   Other family hx (Not Specified)  No partnership data on file   Family History  Problem Relation Age of Onset   Hypertension Mother    Diabetes Mother        insulin dependent   Hyperlipidemia Mother    Cancer Mother    Cirrhosis Father    Diabetes Father    Stroke Father    Diabetes Brother    Diabetes Other    Hypertension Other    Heart disease Other    Arthritis Other    Allergies  Allergen Reactions   Aleve  [Naproxen Sodium]    Mobic  [Meloxicam]    Voltaren [Diclofenac Sodium] Nausea And Vomiting    Patient Care Team: Malva Limes, MD as PCP - General (Family Medicine) Solum, Marlana Salvage, MD as Physician  Assistant (Internal Medicine) Wyline Mood, MD as Consulting Physician (Gastroenterology) Salena Saner, MD as Consulting Physician (Pulmonary Disease) Antonieta Iba, MD as Consulting Physician (Cardiology) Pa, [facility] (Ophthalmology)   Medications: Outpatient Medications Prior to Visit  Medication Sig   albuterol (VENTOLIN HFA) 108 (90 Base) MCG/ACT inhaler Inhale 2 puffs into the lungs every 6 (six) hours as needed for wheezing or shortness of breath.   alpha-1-proteinase inhibitor, human, (PROLASTIN-C) 1000 MG/20ML SOLN Inject 60 mg/kg into the vein once a week.   ascorbic acid (VITAMIN C) 500 MG tablet Take by mouth.   clopidogrel (PLAVIX) 75 MG tablet Take 1 tablet (75 mg total) by mouth daily.   Coenzyme Q10 (COQ10) 200 MG CAPS Take 1 capsule by mouth 2 (two) times daily.   Continuous Blood Gluc Sensor (FREESTYLE LIBRE 14 DAY SENSOR) MISC    Cyanocobalamin (VITAMIN B 12) 250 MCG LOZG Take by mouth.   fluticasone (FLONASE) 50 MCG/ACT nasal spray Place into the nose.   Fluticasone-Umeclidin-Vilant (TRELEGY ELLIPTA) 100-62.5-25 MCG/ACT AEPB Inhale 1 puff into the lungs daily.   insulin regular human CONCENTRATED (HUMULIN R) 500 UNIT/ML injection Use up to 250 units (as calculated by U100 syringe) daily via insulin pump   ketoconazole (NIZORAL) 2 % shampoo    metFORMIN (GLUCOPHAGE) 500 MG tablet Take 500 mg by mouth 2 (two) times daily.    Multiple Vitamin (MULTIVITAMIN) tablet Take 1 tablet by mouth daily.   Omega-3 Fatty Acids (FISH OIL) 1000 MG CAPS Take 1 capsule by mouth 2 (two) times daily.    OVER THE COUNTER MEDICATION Take 600 mg by mouth in the morning and at bedtime. Beet Root   pantoprazole (PROTONIX) 40 MG tablet TAKE ONE TABLET BY MOUTH ONE TIME DAILY   rosuvastatin (CRESTOR) 40 MG tablet TAKE ONE TABLET BY MOUTH ONE TIME DAILY   tamsulosin (FLOMAX) 0.4 MG CAPS capsule Take 1 capsule (0.4 mg total) by mouth daily.   telmisartan (MICARDIS) 40 MG tablet  Take 1 tablet (40 mg total) by mouth daily.   LORazepam (ATIVAN) 0.5 MG tablet Take by mouth. 1-2 every 6 hours as needed    sertraline (ZOLOFT) 50 MG tablet TAKE ONE TABLET BY MOUTH ONE TIME DAILY   No facility-administered medications prior to visit.     Objective    BP 136/76 (BP Location: Left Arm, Patient Position: Sitting, Cuff Size: Large)   Pulse 73   Temp 98.3 F (36.8 C) (Temporal)   Resp 12   Ht 5\' 10"  (1.778 m)   Wt 245 lb (111.1 kg)   BMI 35.15 kg/m    Physical Exam  General Appearance:    Obese male. Alert, cooperative, in no acute distress, appears stated age  Head:    Normocephalic, without obvious abnormality, atraumatic  Eyes:    PERRL, conjunctiva/corneas clear, EOM's intact, fundi    benign, both eyes       Ears:    Normal TM's and external ear canals, both ears  Nose:   Nares normal, septum midline, mucosa normal, no drainage   or sinus tenderness  Throat:   Lips, mucosa, and tongue normal; teeth and gums normal  Neck:   Supple, symmetrical, trachea midline, no adenopathy;       thyroid:  No enlargement/tenderness/nodules; no carotid   bruit or JVD  Back:     Symmetric, no curvature, ROM normal, no CVA tenderness  Lungs:     Clear to auscultation bilaterally, respirations unlabored  Chest wall:    No tenderness or deformity  Heart:    Normal heart rate. Normal rhythm. No murmurs, rubs, or gallops.  S1 and S2 normal  Abdomen:     Soft, non-tender, bowel sounds active all four quadrants,    no masses, no organomegaly  Genitalia:    deferred  Rectal:    deferred  Extremities:   All extremities are intact. No cyanosis or edema  Pulses:   2+ and symmetric all extremities  Skin:   Skin color, texture, turgor normal, no rashes or lesions  Lymph nodes:   Cervical, supraclavicular, and axillary nodes normal  Neurologic:   CNII-XII intact. Normal strength, sensation and reflexes      throughout     Last depression screening scores    02/17/2023   10:29  AM 07/29/2021   10:44 AM 11/26/2020   10:40 AM  PHQ 2/9 Scores  PHQ - 2 Score 0 0 0  PHQ- 9 Score  0 1   Last fall risk screening    02/17/2023   10:29 AM  Fall Risk   Falls in the past year? 0  Number falls in past yr: 0  Injury with Fall? 0  Risk for fall due to : No Fall Risks  Follow up Falls evaluation completed   Last Audit-C alcohol use screening    07/29/2021   10:44 AM  Alcohol Use Disorder Test (AUDIT)  1. How often do you have a drink containing alcohol? 0  2. How many drinks containing alcohol do you have on a typical day when you are drinking? 0  3. How often do you have six or more drinks on one occasion? 0  AUDIT-C Score 0   A score of 3 or more in women, and 4 or more in men indicates increased risk for alcohol abuse, EXCEPT if all of the points are from question 1     Assessment & Plan    Routine Health Maintenance and Physical Exam  Exercise Activities and Dietary recommendations  Goals   None     Immunization History  Administered Date(s) Administered   Hep A / Hep B 04/06/2019, 05/04/2019, 09/03/2022   Influenza,inj,Quad PF,6+ Mos 04/20/2018, 04/10/2019   Influenza-Unspecified 04/28/2017   Pneumococcal Polysaccharide-23 07/23/2011   Tdap 07/23/2011   Zoster Recombinant(Shingrix) 04/20/2018, 06/22/2018    Health Maintenance  Topic Date Due   COVID-19 Vaccine (1) Never done   DTaP/Tdap/Td (2 - Td or Tdap) 07/22/2021   Colonoscopy  12/23/2021   Diabetic kidney evaluation - Urine ACR  03/17/2023   INFLUENZA VACCINE  03/11/2023   HEMOGLOBIN A1C  03/30/2023   OPHTHALMOLOGY EXAM  06/02/2023   Diabetic kidney evaluation - eGFR measurement  02/17/2024   Hepatitis C Screening  Completed   HIV Screening  Completed   Zoster Vaccines- Shingrix  Completed   HPV VACCINES  Aged Out    Discussed health benefits of physical activity, and encouraged him to engage in regular exercise appropriate for his age and condition.  2. Essential (primary)  hypertension Well controlled.  Continue current medications.    3. Gastroesophageal reflux disease, unspecified whether esophagitis present Well controlled.  Continue current PPI  4. Panic disorder Well controlled , refill sertraline (ZOLOFT) 50 MG tablet; Take 1 tablet (50 mg total) by mouth daily.  Dispense: 90 tablet; Refill: 3   5. Coronary artery disease of native artery of native heart with stable angina pectoris (HCC)  Asymptomatic. Compliant with medication.  Continue aggressive risk factor modification.  Continue current medications and routine follow up with cardiologist, Dr. Mariah Milling  6. Type 2 diabetes mellitus with stage 2 chronic kidney disease, with long-term current use of insulin (HCC) Well controlled.  Continue follow up with Dr. Reuel Derby  7. Mixed hyperlipidemia He is tolerating rosuvastatin well with no adverse effects.   - Comprehensive metabolic panel - Lipid Panel With LDL/HDL Ratio  8. Anxiety disorder, unspecified type  Takes occasional LORazepam (ATIVAN) 0.5 MG tablet; 1-2 every 6 hours as neededTake by mouth. 1-2 every 6 hours as needed  Dispense: 30 tablet; Refill: 3  9. AAT (alpha-1-antitrypsin) deficiency (HCC) Stable, follow up Dr. Jayme Cloud as scheduled.   10. Prostate cancer screening  - PSA Total (Reflex To Free) ([facility] only)  11. Need for tetanus, diphtheria, and acellular pertussis (Tdap) vaccine in patient of adolescent age or older  Out of Tdap today, will administer at follow up     Mila Merry, MD  [facility] [phone] (phone) [phone] (fax)  [facility]

## 2023-03-24 DIAGNOSIS — E1159 Type 2 diabetes mellitus with other circulatory complications: Secondary | ICD-10-CM | POA: Diagnosis not present

## 2023-03-25 ENCOUNTER — Ambulatory Visit
Admission: RE | Admit: 2023-03-25 | Discharge: 2023-03-25 | Disposition: A | Discharge status: Home / Self Care | Payer: BC Managed Care – PPO | Source: Ambulatory Visit | Attending: Gastroenterology | Admitting: Gastroenterology

## 2023-03-25 DIAGNOSIS — K8020 Calculus of gallbladder without cholecystitis without obstruction: Secondary | ICD-10-CM | POA: Diagnosis not present

## 2023-03-25 DIAGNOSIS — K7030 Alcoholic cirrhosis of liver without ascites: Secondary | ICD-10-CM | POA: Insufficient documentation

## 2023-03-25 DIAGNOSIS — K7460 Unspecified cirrhosis of liver: Secondary | ICD-10-CM | POA: Diagnosis not present

## 2023-03-25 DIAGNOSIS — K838 Other specified diseases of biliary tract: Secondary | ICD-10-CM | POA: Diagnosis not present

## 2023-03-30 DIAGNOSIS — Z9641 Presence of insulin pump (external) (internal): Secondary | ICD-10-CM | POA: Diagnosis not present

## 2023-03-30 DIAGNOSIS — K760 Fatty (change of) liver, not elsewhere classified: Secondary | ICD-10-CM | POA: Diagnosis not present

## 2023-03-30 DIAGNOSIS — E1159 Type 2 diabetes mellitus with other circulatory complications: Secondary | ICD-10-CM | POA: Diagnosis not present

## 2023-03-30 DIAGNOSIS — E1129 Type 2 diabetes mellitus with other diabetic kidney complication: Secondary | ICD-10-CM | POA: Diagnosis not present

## 2023-04-15 DIAGNOSIS — E119 Type 2 diabetes mellitus without complications: Secondary | ICD-10-CM | POA: Diagnosis not present

## 2023-04-27 ENCOUNTER — Encounter: Payer: Self-pay | Admitting: Gastroenterology

## 2023-04-27 ENCOUNTER — Ambulatory Visit (INDEPENDENT_AMBULATORY_CARE_PROVIDER_SITE_OTHER): Payer: BC Managed Care – PPO | Admitting: Gastroenterology

## 2023-04-27 VITALS — BP 114/74 | HR 79 | Temp 98.6°F | Ht 70.0 in | Wt 235.2 lb

## 2023-04-27 DIAGNOSIS — Z1211 Encounter for screening for malignant neoplasm of colon: Secondary | ICD-10-CM

## 2023-04-27 DIAGNOSIS — K7469 Other cirrhosis of liver: Secondary | ICD-10-CM | POA: Diagnosis not present

## 2023-04-27 NOTE — Patient Instructions (Signed)
Follow up appointment in March 2025

## 2023-04-27 NOTE — Progress Notes (Signed)
Patient decline EGD and Colonoscopy. Patient also decline pneumococcal vaccine

## 2023-04-27 NOTE — Progress Notes (Signed)
Wyline Mood MD, MRCP(U.K) [address]  [address]  [place], [place] [address]  Main: [phone]  Fax: [phone]   Primary Care Physician: Malva Limes, MD  Primary Gastroenterologist:  Dr. Wyline Mood   Chief Complaint  Patient presents with   Follow-up    HPI: Alex [name] is a 57 y.o. male   Summary of history :  Last seen at the office in 08/2022: Prior to that seen in 03/2019 where he was referred for cirrhosis of the liver.  He has interstitial lung disease and on CT scan of the chest was noted to have possible liver cirrhosis. He follows with Dr. Jayme Cloud for alpha antitrypsin 1 deficiency on Prolastin weekly.Marland Kitchen  His father died from liver cirrhosis secondary to fatty liver disease . alpha-1 antitrypsin levels were very low on evaluation at 77 liver elastography showed features of F2 and F3 fibrosis he had lost weight on advice.  He was not immune to hepatitis a and B otherwise autoimmune markers were negative.   EGD in 2020 to screen for esophageal varices showed erythema in the gastric antrum otherwise no varices were noted. 12/23/2020 had a screening colonoscopy to 7 to 9 mm polyps in the sigmoid and ascending colon was snared.  Bowel prep was unsatisfactory hence repeat colonoscopy in 6 months recommended the polyps were tubular adenoma.  He was subsequently lost to follow-up.       Interval history 09/03/2022-04/27/2023  03/25/2023: RUQ USG: steatosis no cirrhosis - no liver mass 09/03/2022: MELD -8  Doing well no complaints on a low-salt diet eating healthy trying to exercise no alcohol use taking his medication for alpha-1 antitrypsin deficiency.   Current Outpatient Medications  Medication Sig Dispense Refill   albuterol (VENTOLIN HFA) 108 (90 Base) MCG/ACT inhaler Inhale 2 puffs into the lungs every 6 (six) hours as needed for wheezing or shortness of breath. 8 g 2   alpha-1-proteinase inhibitor, human, (PROLASTIN-C) 1000 MG/20ML SOLN Inject 60 mg/kg into  the vein once a week.     ascorbic acid (VITAMIN C) 500 MG tablet Take by mouth.     clopidogrel (PLAVIX) 75 MG tablet Take 1 tablet (75 mg total) by mouth daily. 90 tablet 3   Coenzyme Q10 (COQ10) 200 MG CAPS Take 1 capsule by mouth 2 (two) times daily.     Continuous Blood Gluc Sensor (FREESTYLE LIBRE 14 DAY SENSOR) MISC      Cyanocobalamin (VITAMIN B 12) 250 MCG LOZG Take by mouth.     fluticasone (FLONASE) 50 MCG/ACT nasal spray Place into the nose.     Fluticasone-Umeclidin-Vilant (TRELEGY ELLIPTA) 100-62.5-25 MCG/ACT AEPB Inhale 1 puff into the lungs daily. 28 each 11   insulin regular human CONCENTRATED (HUMULIN R) 500 UNIT/ML injection Use up to 250 units (as calculated by U100 syringe) daily via insulin pump     ketoconazole (NIZORAL) 2 % shampoo      LORazepam (ATIVAN) 0.5 MG tablet 1-2 every 6 hours as neededTake by mouth. 1-2 every 6 hours as needed 30 tablet 3   metFORMIN (GLUCOPHAGE) 500 MG tablet Take 500 mg by mouth 2 (two) times daily.      Multiple Vitamin (MULTIVITAMIN) tablet Take 1 tablet by mouth daily.     Omega-3 Fatty Acids (FISH OIL) 1000 MG CAPS Take 1 capsule by mouth 2 (two) times daily.      OVER THE COUNTER MEDICATION Take 600 mg by mouth in the morning and at bedtime. Beet Root  pantoprazole (PROTONIX) 40 MG tablet TAKE ONE TABLET BY MOUTH ONE TIME DAILY 90 tablet 2   rosuvastatin (CRESTOR) 40 MG tablet TAKE ONE TABLET BY MOUTH ONE TIME DAILY 90 tablet 0   sertraline (ZOLOFT) 50 MG tablet Take 1 tablet (50 mg total) by mouth daily. 90 tablet 3   tamsulosin (FLOMAX) 0.4 MG CAPS capsule Take 1 capsule (0.4 mg total) by mouth daily. 30 capsule 0   telmisartan (MICARDIS) 40 MG tablet Take 1 tablet (40 mg total) by mouth daily. 90 tablet 3   No current facility-administered medications for this visit.    Allergies as of 04/27/2023 - Review Complete 04/27/2023  Allergen Reaction Noted   Aleve  [naproxen sodium]  01/29/2015   Mobic  [meloxicam]  01/29/2015    Voltaren [diclofenac sodium] Nausea And Vomiting 01/29/2015       ROS:  General: Negative for anorexia, weight loss, fever, chills, fatigue, weakness. ENT: Negative for hoarseness, difficulty swallowing , nasal congestion. CV: Negative for chest pain, angina, palpitations, dyspnea on exertion, peripheral edema.  Respiratory: Negative for dyspnea at rest, dyspnea on exertion, cough, sputum, wheezing.  GI: See history of present illness. GU:  Negative for dysuria, hematuria, urinary incontinence, urinary frequency, nocturnal urination.  Endo: Negative for unusual weight change.    Physical Examination:   BP 114/74   Pulse 79   Temp 98.6 F (37 C) (Oral)   Ht 5\' 10"  (1.778 m)   Wt 235 lb 4 oz (106.7 kg)   BMI 33.75 kg/m   General: Well-nourished, well-developed in no acute distress.  Eyes: No icterus. Conjunctivae pink. Mouth: Oropharyngeal mucosa moist and pink , no lesions erythema or exudate. Neuro: Alert and oriented x 3.  Grossly intact. Skin: Warm and dry, no jaundice.   Psych: Alert and cooperative, normal mood and affect.   Imaging Studies: No results found.  Assessment and Plan:   Alex [name] is a 57 y.o. y/o male here to follow up for liver cirrhosis , compensated - MELD 8 in 08/2022 ,  follow-up lost to follow-up for over 3 years.  Has alpha and antitrypsin 1 deficiency.  Prior colonoscopy was incomplete due to poor prep.       Plan 1.  EGD to screen for esophageal varices, colonoscopy due to poor prep.  Sleep. 2.  AFP and right upper quadrant ultrasound in 09/2023 3.  Hepatitis a and B vaccination 4.  CBC, CMP, AFP, INR to calculate MELD score.  Elita Boone FibroSure test to determine if he would benefit from medication for fatty liver in the future.  He has been started on a GLP 1 analog which hopefully should help him lose more than 10% of his weight to help reverse fibrosis  I have discussed alternative options, risks & benefits,  which include, but are not  limited to, bleeding, infection, perforation,respiratory complication & drug reaction.  The patient agrees with this plan & written consent will be obtained.        Dr Wyline Mood  MD,MRCP [facility]) Follow up in 6 months

## 2023-04-28 ENCOUNTER — Telehealth: Payer: Self-pay

## 2023-04-28 NOTE — Telephone Encounter (Signed)
Patient verbalized understanding of results  

## 2023-04-28 NOTE — Telephone Encounter (Signed)
-----   Message from Wyline Mood sent at 04/28/2023  8:59 AM EDT ----- Labs stable

## 2023-04-29 ENCOUNTER — Telehealth: Payer: Self-pay

## 2023-04-29 LAB — COMPREHENSIVE METABOLIC PANEL
ALT: 28 IU/L (ref 0–44)
AST: 30 IU/L (ref 0–40)
Albumin: 4.6 g/dL (ref 3.8–4.9)
Alkaline Phosphatase: 73 IU/L (ref 44–121)
BUN/Creatinine Ratio: 10 (ref 9–20)
BUN: 11 mg/dL (ref 6–24)
Bilirubin Total: 0.5 mg/dL (ref 0.0–1.2)
CO2: 22 mmol/L (ref 20–29)
Calcium: 9.8 mg/dL (ref 8.7–10.2)
Chloride: 105 mmol/L (ref 96–106)
Creatinine, Ser: 1.09 mg/dL (ref 0.76–1.27)
Globulin, Total: 2.6 g/dL (ref 1.5–4.5)
Glucose: 87 mg/dL (ref 70–99)
Potassium: 4.4 mmol/L (ref 3.5–5.2)
Sodium: 143 mmol/L (ref 134–144)
Total Protein: 7.2 g/dL (ref 6.0–8.5)
eGFR: 80 mL/min/{1.73_m2} (ref >59)

## 2023-04-29 LAB — CBC WITH DIFFERENTIAL/PLATELET
Basophils Absolute: 0 10*3/uL (ref 0.0–0.2)
Basos: 1 %
EOS (ABSOLUTE): 0.2 10*3/uL (ref 0.0–0.4)
Eos: 2 %
Hematocrit: 44.4 % (ref 37.5–51.0)
Hemoglobin: 15 g/dL (ref 13.0–17.7)
Immature Grans (Abs): 0 10*3/uL (ref 0.0–0.1)
Immature Granulocytes: 0 %
Lymphocytes Absolute: 1.9 10*3/uL (ref 0.7–3.1)
Lymphs: 30 %
MCH: 30.9 pg (ref 26.6–33.0)
MCHC: 33.8 g/dL (ref 31.5–35.7)
MCV: 92 fL (ref 79–97)
Monocytes Absolute: 0.5 10*3/uL (ref 0.1–0.9)
Monocytes: 8 %
Neutrophils Absolute: 3.9 10*3/uL (ref 1.4–7.0)
Neutrophils: 59 %
Platelets: 235 10*3/uL (ref 150–450)
RBC: 4.85 x10E6/uL (ref 4.14–5.80)
RDW: 13.4 % (ref 11.6–15.4)
WBC: 6.5 10*3/uL (ref 3.4–10.8)

## 2023-04-29 LAB — NASH FIBROSURE(R) PLUS
ALPHA 2-MACROGLOBULINS, QN: 395 mg/dL — ABNORMAL HIGH (ref 110–276)
ALT (SGPT) P5P: 35 IU/L (ref 0–55)
AST (SGOT) P5P: 36 IU/L (ref 0–40)
Apolipoprotein A-1: 136 mg/dL (ref 101–178)
Bilirubin, Total: 0.4 mg/dL (ref 0.0–1.2)
Cholesterol, Total: 161 mg/dL (ref 100–199)
Fibrosis Score: 0.63 — ABNORMAL HIGH (ref 0.00–0.21)
GGT: 41 IU/L (ref 0–65)
Glucose: 86 mg/dL (ref 70–99)
Haptoglobin: 93 mg/dL (ref 29–370)
NASH Score: 0 (ref 0.00–0.25)
Steatosis Score: 0.35 (ref 0.00–0.40)
Triglycerides: 242 mg/dL — ABNORMAL HIGH (ref 0–149)

## 2023-04-29 LAB — AFP TUMOR MARKER: AFP, Serum, Tumor Marker: 17.9 ng/mL — ABNORMAL HIGH (ref 0.0–8.4)

## 2023-04-29 LAB — PROTIME-INR
INR: 1.1 (ref 0.9–1.2)
Prothrombin Time: 11.9 s (ref 9.1–12.0)

## 2023-04-29 NOTE — Telephone Encounter (Signed)
Called and patient verbalized understanding of results

## 2023-04-29 NOTE — Telephone Encounter (Signed)
-----   Message from Alex [name] sent at 04/29/2023  8:34 AM EDT ----- Colon Flattery shows F3 fibrosis and he may be a candidate for the new drug for fatty liver- we can discuss it at his next visit in a few months

## 2023-04-29 NOTE — Progress Notes (Signed)
Cardiology Office Note:  .   Date:  04/30/2023  ID:  Alex [name], DOB Jul 03, 1966, MRN [id number] PCP: Alex Limes, MD  [facility] Providers Cardiologist:  Alex Nordmann, MD    History of Present Illness: .   Alex [name] is a 57 y.o. male with a past medical history of coronary artery disease, mixed hyperlipidemia, essential hypertension, alcoholic cirrhosis without ascites, alpha-1 antitrypsin deficiency, asthma/COPD, former smoker (quit 2008), peripheral arterial disease, type 2 diabetes on insulin therapy, carotid stenosis, who is here today for follow-up.  Coronary artery disease with previous left heart catheterization with PCI/DES to the LAD in 03/2007.  Echocardiogram completed 10/2018 revealed LVEF 55%, mild mitral valve insufficiency and trace tricuspid insufficiency.  He underwent Lexiscan Myoview 10/2018 and showed normal treadmill EKG without evidence of ischemia or arrhythmia and normal myocardial perfusion without evidence of myocardial ischemia.  He was last seen in clinic/30/2024 by Dr. Mariah [name].  At that time he was establishing care.  Continued history of coronary artery disease.  He was doing well with no anginal or anginal equivalent symptoms.  There was no further testing that was ordered and medication changes that were made.  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  He returns to clinic today stating that he is feeling well.  He denies any current chest pain, shortness of breath, palpitations, or peripheral edema.  States that he has been compliant with his current medication regimen.  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  Denies any hospitalizations or visits to the emergency department.  ROS: 10 point review of systems has been reviewed and considered negative with exception with been listed in the HPI  Studies Reviewed: Marland Kitchen   EKG Interpretation Date/Time:  Friday  April 30 2023 08:14:30 EDT Ventricular Rate:  83 PR Interval:  142 QRS Duration:  94 QT Interval:  352 QTC Calculation: 413 R Axis:   -16  Text Interpretation: Normal sinus rhythm When compared with ECG of 22-Oct-2020 09:13, No significant change was found Confirmed by Alex [name] ([id number]) on 04/30/2023 8:19:23 AM   Lexiscan MPI 10/2018 (outside facility) Normal treadmill EKG without evidence of ischemia or arrhythmia  Normal myocardial perfusion without evidence of myocardial ischemia    TTE 10/19/2018 (outside facility) INTERPRETATION  NORMAL LEFT VENTRICULAR SYSTOLIC FUNCTION WITH AN ESTIMATED EF = 55 %  NORMAL RIGHT VENTRICULAR SYSTOLIC FUNCTION  MILD MITRAL VALVE INSUFFICIENCY  TRACE TRICUSPID VALVE INSUFFICIENCY  NO VALVULAR STENOSIS  Risk Assessment/Calculations:             Physical Exam:   VS:  BP 112/72 (BP Location: Left Arm, Patient Position: Sitting, Cuff Size: Normal)   Pulse 83   Ht 5\' 10"  (1.778 m)   Wt 233 lb (105.7 kg)   SpO2 97%   BMI 33.43 kg/m    Wt Readings from Last 3 Encounters:  04/30/23 233 lb (105.7 kg)  04/27/23 235 lb 4 oz (106.7 kg)  02/17/23 245 lb (111.1 kg)    GEN: Well nourished, well developed in no acute distress NECK: No JVD; No carotid bruits CARDIAC: RRR, no murmurs, rubs, gallops RESPIRATORY:  Clear to auscultation without rales, wheezing or rhonchi  ABDOMEN: Soft, non-tender, non-distended EXTREMITIES:  No edema; No deformity   ASSESSMENT AND PLAN: .   Coronary artery disease of the native artery with stable angina with prior stenting to the LAD in 2008.  EKG today reveals sinus rhythm with a left axis deviation and no acute changes concerning for ischemia.  He is continued on clopidogrel 75 mg daily and rosuvastatin 40 mg daily.  No further ischemic workup needed at this time.  Primary hypertension with blood pressure today 112/72.  He is continued on telmisartan 40 mg daily.  Encouraged to continue to monitor his blood  pressures 1 to 2 hours postmedication administration.  Mixed hyperlipidemia with an LDL of 67.  He is continued on rosuvastatin 40 mg daily and co-Q10.  Encouraged to continue to work on dietary and lifestyle modification.  Alpha 1 antitrypsin rate continues to be followed by pulmonary.  Type 2 diabetes where he is continued on metformin and Humulin R via insulin pump.  States his blood sugars have been well-controlled.  This continues to be managed by his PCP.       Dispo: Patient to return to clinic to see MD/APP in 1 year or sooner if needed  Signed, Alex Berkovich, NP

## 2023-04-30 ENCOUNTER — Encounter: Payer: Self-pay | Admitting: Cardiology

## 2023-04-30 ENCOUNTER — Ambulatory Visit: Payer: BC Managed Care – PPO | Attending: Cardiology | Admitting: Cardiology

## 2023-04-30 VITALS — BP 112/72 | HR 83 | Ht 70.0 in | Wt 233.0 lb

## 2023-04-30 DIAGNOSIS — E782 Mixed hyperlipidemia: Secondary | ICD-10-CM | POA: Diagnosis not present

## 2023-04-30 DIAGNOSIS — I25118 Atherosclerotic heart disease of native coronary artery with other forms of angina pectoris: Secondary | ICD-10-CM | POA: Diagnosis not present

## 2023-04-30 DIAGNOSIS — E8801 Alpha-1-antitrypsin deficiency: Secondary | ICD-10-CM

## 2023-04-30 DIAGNOSIS — N182 Chronic kidney disease, stage 2 (mild): Secondary | ICD-10-CM

## 2023-04-30 DIAGNOSIS — Z794 Long term (current) use of insulin: Secondary | ICD-10-CM

## 2023-04-30 DIAGNOSIS — I10 Essential (primary) hypertension: Secondary | ICD-10-CM

## 2023-04-30 DIAGNOSIS — E1122 Type 2 diabetes mellitus with diabetic chronic kidney disease: Secondary | ICD-10-CM

## 2023-04-30 NOTE — Patient Instructions (Signed)
Medication Instructions:  Your physician recommends that you continue on your current medications as directed. Please refer to the Current Medication list given to you today.  *If you need a refill on your cardiac medications before your next appointment, please call your pharmacy*  Lab Work: -None ordered  Testing/Procedures: -None ordered  Follow-Up: At [facility], you and your health needs are our priority.  As part of our continuing mission to provide you with exceptional heart care, we have created designated Provider Care Teams.  These Care Teams include your primary Cardiologist (physician) and Advanced Practice Providers (APPs -  Physician Assistants and Nurse Practitioners) who all work together to provide you with the care you need, when you need it.  Your next appointment:   1 year(s)  Provider:   You may see Julien Nordmann, MD or one of the following Advanced Practice Providers on your designated Care Team:   Nicolasa Ducking, NP Eula Listen, PA-C Cadence Fransico Michael, PA-C Charlsie Quest, NP    Other Instructions -None

## 2023-05-03 ENCOUNTER — Telehealth: Payer: Self-pay | Admitting: Cardiovascular Disease

## 2023-05-03 DIAGNOSIS — I2510 Atherosclerotic heart disease of native coronary artery without angina pectoris: Secondary | ICD-10-CM

## 2023-05-03 DIAGNOSIS — I25118 Atherosclerotic heart disease of native coronary artery with other forms of angina pectoris: Secondary | ICD-10-CM

## 2023-05-03 NOTE — Telephone Encounter (Signed)
Spoke with patient and he states he has never had to have this done before. Reviewed that per guidelines you must have ETT every 2 years. He states that his DOT is due this Thursday and if ETT is required he would lose his job. He became upset and states that is just great. Advised that I can order the test and he can just call back to schedule. He said no and that he will just have to lose his job. Advised that unfortunately we have to follow guidelines in order to provide DOT clearance. He was frustrated and had no further questions.      Charlsie Quest, NP  [employer] Flow May 03, 2023  4:18 PM  To: Bryna Colander, RN   Message  According to current DOT recommendations patient needs to be scheduled for a exercise tolerance test. Without following guideline recommendations we are unable to complete his current DOT forms. This was discussed during his visit and he was under the impression that he only needed an EKG to meet his requirements. He can be scheduled for his testing without a clinic visit as testing has previously been discussed.  Thanks,  NIKE

## 2023-05-03 NOTE — Telephone Encounter (Signed)
Form and documentation reviewed with Charlsie Quest NP and will forward to her for further recommendations.

## 2023-05-03 NOTE — Telephone Encounter (Signed)
Patient dropped off DOT form to be signed by physician. Placed in Dr. Windell Hummingbird RN box.

## 2023-05-03 NOTE — Telephone Encounter (Signed)
Form picked up by Elita Quick, RN.

## 2023-05-03 NOTE — Telephone Encounter (Signed)
Patient is requesting the form by tomorrow if all possible. He needs this for an appointment on Thursday, 9/26, and will be out of town on Wednesday 9/25.

## 2023-05-04 NOTE — Telephone Encounter (Signed)
Patient called back requesting to speak with nurse Pam in regards to DOT Form

## 2023-05-04 NOTE — Telephone Encounter (Signed)
Patient called back in stating that on his paperwork there is no documentation that requires ETT. Reviewed that DOT guidelines require that ETT be done every 2 years. He went on to say that he never had this problem before when he saw Dr. Gwen Pounds. Expressed understanding of his frustration and the process that we have to go by. He states that he will call over to their office, the DOT examiner, and try to see what is going on with these requirements. He became upset and stated that he was not told this information and has never been required. He then said for me to schedule the ETT. Advised that I would get that scheduled and give him a call back with that information. He then hung up the phone.

## 2023-05-04 NOTE — Telephone Encounter (Signed)
Spoke with patient and advised we were able to schedule him for Thursday 05/06/23 for his ETT at the [facility] with arrival time of 7:45 am. Reviewed all instructions for this test and he verbalized understanding with no further questions at this time.    - you may eat a light breakfast/ lunch prior to your procedure - no caffeine for 24 hours prior to your test (coffee, tea, soft drinks, or chocolate)  - no smoking/ vaping for 4 hours prior to your test - you may take your regular medications the day of your test  - bring any inhalers with you to your test - wear comfortable clothing & tennis/ non-skid shoes to walk on the treadmill

## 2023-05-06 ENCOUNTER — Encounter: Payer: Self-pay | Admitting: Cardiovascular Disease

## 2023-05-06 ENCOUNTER — Ambulatory Visit: Admission: RE | Admit: 2023-05-06 | Payer: BC Managed Care – PPO | Source: Ambulatory Visit

## 2023-05-17 ENCOUNTER — Ambulatory Visit
Admission: RE | Admit: 2023-05-17 | Discharge: 2023-05-17 | Disposition: A | Discharge status: Home / Self Care | Payer: BC Managed Care – PPO | Source: Ambulatory Visit | Attending: Cardiology | Admitting: Cardiology

## 2023-05-17 DIAGNOSIS — I2510 Atherosclerotic heart disease of native coronary artery without angina pectoris: Secondary | ICD-10-CM | POA: Insufficient documentation

## 2023-05-17 DIAGNOSIS — I493 Ventricular premature depolarization: Secondary | ICD-10-CM | POA: Insufficient documentation

## 2023-05-17 DIAGNOSIS — I25118 Atherosclerotic heart disease of native coronary artery with other forms of angina pectoris: Secondary | ICD-10-CM | POA: Diagnosis not present

## 2023-05-17 LAB — EXERCISE TOLERANCE TEST
Angina Index: 0
Base ST Depression (mm): 0 mm
Duke Treadmill Score: 8
Estimated workload: 10.1
Exercise duration (min): 8 min
Exercise duration (sec): 0 s
MPHR: 165 {beats}/min
Peak HR: 169 {beats}/min
Percent HR: 102 %
Rest HR: 82 {beats}/min
ST Depression (mm): 0 mm

## 2023-05-18 ENCOUNTER — Other Ambulatory Visit: Payer: Self-pay | Admitting: Family Medicine

## 2023-05-18 ENCOUNTER — Telehealth: Payer: Self-pay | Admitting: Cardiovascular Disease

## 2023-05-18 DIAGNOSIS — K219 Gastro-esophageal reflux disease without esophagitis: Secondary | ICD-10-CM

## 2023-05-18 NOTE — Telephone Encounter (Signed)
Form given to Charlsie Quest NP to get completed.

## 2023-05-18 NOTE — Telephone Encounter (Signed)
  Pt is calling to follow up his DOT paperwork, he said, he gave the paperwork and did his stress test today. He said, his DOT is due on Thursday at 8 am and hoping he can pick up the paperwork sometime tomorrow

## 2023-05-19 NOTE — Telephone Encounter (Signed)
patient was returning phone call

## 2023-05-19 NOTE — Telephone Encounter (Signed)
The forms should have ben faxed this morning and the results were entered earlier.

## 2023-05-19 NOTE — Progress Notes (Signed)
DOT forms completed and faxed. Per DOT standards patient will require ETT every two years.

## 2023-05-19 NOTE — Telephone Encounter (Signed)
Called patient, advised DOT forms faxed this morning.  Patient verbalized understanding

## 2023-05-19 NOTE — Telephone Encounter (Signed)
I attempted to contact patient, unable to reach or leave a message.  Will try again later.

## 2023-05-19 NOTE — Telephone Encounter (Signed)
Patient is calling back for update. States no one called him and he needs that form for tomorrow. Please advise

## 2023-05-20 ENCOUNTER — Telehealth: Payer: Self-pay | Admitting: Cardiovascular Disease

## 2023-05-20 NOTE — Telephone Encounter (Signed)
Follow Up:     Patient called to say his doctor office said they did not received the fax from yesterday. He would like for you to refax this today to [phone] please.

## 2023-05-20 NOTE — Telephone Encounter (Signed)
DOT forms faxed on 05/20/23

## 2023-07-15 DIAGNOSIS — Z9641 Presence of insulin pump (external) (internal): Secondary | ICD-10-CM | POA: Diagnosis not present

## 2023-07-15 DIAGNOSIS — K760 Fatty (change of) liver, not elsewhere classified: Secondary | ICD-10-CM | POA: Diagnosis not present

## 2023-07-15 DIAGNOSIS — E1159 Type 2 diabetes mellitus with other circulatory complications: Secondary | ICD-10-CM | POA: Diagnosis not present

## 2023-07-15 DIAGNOSIS — E119 Type 2 diabetes mellitus without complications: Secondary | ICD-10-CM | POA: Diagnosis not present

## 2023-07-15 DIAGNOSIS — E1129 Type 2 diabetes mellitus with other diabetic kidney complication: Secondary | ICD-10-CM | POA: Diagnosis not present

## 2023-09-21 DIAGNOSIS — E1159 Type 2 diabetes mellitus with other circulatory complications: Secondary | ICD-10-CM | POA: Diagnosis not present

## 2023-10-05 ENCOUNTER — Telehealth: Payer: BC Managed Care – PPO | Admitting: Physician Assistant

## 2023-10-05 DIAGNOSIS — J208 Acute bronchitis due to other specified organisms: Secondary | ICD-10-CM | POA: Diagnosis not present

## 2023-10-05 DIAGNOSIS — B9689 Other specified bacterial agents as the cause of diseases classified elsewhere: Secondary | ICD-10-CM

## 2023-10-05 MED ORDER — BENZONATATE 100 MG PO CAPS
100.0000 mg | ORAL_CAPSULE | Freq: Three times a day (TID) | ORAL | 0 refills | Status: DC | PRN
Start: 2023-10-05 — End: 2023-11-25

## 2023-10-05 MED ORDER — AZITHROMYCIN 250 MG PO TABS: ORAL_TABLET | ORAL | 0 refills | Status: AC

## 2023-10-05 MED ORDER — PROMETHAZINE-DM 6.25-15 MG/5ML PO SYRP
5.0000 mL | ORAL_SOLUTION | Freq: Four times a day (QID) | ORAL | 0 refills | Status: DC | PRN
Start: 2023-10-05 — End: 2023-10-12

## 2023-10-05 NOTE — Patient Instructions (Signed)
 Alex [name], thank you for joining Margaretann Loveless, PA-C for today's virtual visit.  While this provider is not your primary care provider (PCP), if your PCP is located in our provider database this encounter information will be shared with them immediately following your visit.   A [facility] MyChart account gives you access to today's visit and all your visits, tests, and labs performed at [facility] " click here if you don't have a [facility] MyChart account or go to [email or web address]  Consent: (Patient) Alex [name] provided verbal consent for this virtual visit at the beginning of the encounter.  Current Medications:  Current Outpatient Medications:    azithromycin (ZITHROMAX) 250 MG tablet, Take 2 tablets on day 1, then 1 tablet daily on days 2 through 5, Disp: 6 tablet, Rfl: 0   benzonatate (TESSALON) 100 MG capsule, Take 1-2 capsules (100-200 mg total) by mouth 3 (three) times daily as needed., Disp: 30 capsule, Rfl: 0   promethazine-dextromethorphan (PROMETHAZINE-DM) 6.25-15 MG/5ML syrup, Take 5 mLs by mouth 4 (four) times daily as needed., Disp: 118 mL, Rfl: 0   albuterol (VENTOLIN HFA) 108 (90 Base) MCG/ACT inhaler, Inhale 2 puffs into the lungs every 6 (six) hours as needed for wheezing or shortness of breath., Disp: 8 g, Rfl: 2   alpha-1-proteinase inhibitor, human, (PROLASTIN-C) 1000 MG/20ML SOLN, Inject 60 mg/kg into the vein once a week., Disp: , Rfl:    ascorbic acid (VITAMIN C) 500 MG tablet, Take by mouth., Disp: , Rfl:    clopidogrel (PLAVIX) 75 MG tablet, Take 1 tablet (75 mg total) by mouth daily., Disp: 90 tablet, Rfl: 3   Coenzyme Q10 (COQ10) 200 MG CAPS, Take 1 capsule by mouth 2 (two) times daily., Disp: , Rfl:    Continuous Blood Gluc Sensor (FREESTYLE LIBRE 14 DAY SENSOR) MISC, , Disp: , Rfl:    Cyanocobalamin (VITAMIN B 12) 250 MCG LOZG, Take by mouth., Disp: , Rfl:    fluticasone (FLONASE) 50 MCG/ACT nasal spray, Place into the nose.,  Disp: , Rfl:    Fluticasone-Umeclidin-Vilant (TRELEGY ELLIPTA) 100-62.5-25 MCG/ACT AEPB, Inhale 1 puff into the lungs daily., Disp: 28 each, Rfl: 11   insulin regular human CONCENTRATED (HUMULIN R) 500 UNIT/ML injection, Use up to 250 units (as calculated by U100 syringe) daily via insulin pump, Disp: , Rfl:    ketoconazole (NIZORAL) 2 % shampoo, , Disp: , Rfl:    LORazepam (ATIVAN) 0.5 MG tablet, 1-2 every 6 hours as neededTake by mouth. 1-2 every 6 hours as needed, Disp: 30 tablet, Rfl: 3   metFORMIN (GLUCOPHAGE) 500 MG tablet, Take 500 mg by mouth 2 (two) times daily. , Disp: , Rfl:    Multiple Vitamin (MULTIVITAMIN) tablet, Take 1 tablet by mouth daily., Disp: , Rfl:    Omega-3 Fatty Acids (FISH OIL) 1000 MG CAPS, Take 1 capsule by mouth 2 (two) times daily. , Disp: , Rfl:    OVER THE COUNTER MEDICATION, Take 600 mg by mouth in the morning and at bedtime. Beet Root, Disp: , Rfl:    pantoprazole (PROTONIX) 40 MG tablet, TAKE ONE TABLET BY MOUTH ONE TIME DAILY, Disp: 90 tablet, Rfl: 4   rosuvastatin (CRESTOR) 40 MG tablet, TAKE ONE TABLET BY MOUTH ONE TIME DAILY, Disp: 90 tablet, Rfl: 0   sertraline (ZOLOFT) 50 MG tablet, Take 1 tablet (50 mg total) by mouth daily., Disp: 90 tablet, Rfl: 3   tamsulosin (FLOMAX) 0.4 MG CAPS capsule, Take 1 capsule (0.4 mg total) by  mouth daily., Disp: 30 capsule, Rfl: 0   telmisartan (MICARDIS) 40 MG tablet, Take 1 tablet (40 mg total) by mouth daily., Disp: 90 tablet, Rfl: 3   Medications ordered in this encounter:  Meds ordered this encounter  Medications   azithromycin (ZITHROMAX) 250 MG tablet    Sig: Take 2 tablets on day 1, then 1 tablet daily on days 2 through 5    Dispense:  6 tablet    Refill:  0    Supervising Provider:   Merrilee Jansky [[id number]]   promethazine-dextromethorphan (PROMETHAZINE-DM) 6.25-15 MG/5ML syrup    Sig: Take 5 mLs by mouth 4 (four) times daily as needed.    Dispense:  118 mL    Refill:  0    Supervising Provider:    Merrilee Jansky [[id number]]   benzonatate (TESSALON) 100 MG capsule    Sig: Take 1-2 capsules (100-200 mg total) by mouth 3 (three) times daily as needed.    Dispense:  30 capsule    Refill:  0    Supervising Provider:   Merrilee Jansky [[id number]]     *If you need refills on other medications prior to your next appointment, please contact your pharmacy*  Follow-Up: Call back or seek an in-person evaluation if the symptoms worsen or if the condition fails to improve as anticipated.  [facility] [phone]  Other Instructions Acute Bronchitis, Adult  Acute bronchitis is sudden inflammation of the main airways (bronchi) that come off the windpipe (trachea) in the lungs. The swelling causes the airways to get smaller and make more mucus than normal. This can make it hard to breathe and can cause coughing or noisy breathing (wheezing). Acute bronchitis may last several weeks. The cough may last longer. Allergies, asthma, and exposure to smoke may make the condition worse. What are the causes? This condition can be caused by germs and by substances that irritate the lungs, including: Cold and flu viruses. The most common cause of this condition is the virus that causes the common cold. Bacteria. This is less common. Breathing in substances that irritate the lungs, including: Smoke from cigarettes and other forms of tobacco. Dust and pollen. Fumes from household cleaning products, gases, or burned fuel. Indoor or outdoor air pollution. What increases the risk? The following factors may make you more likely to develop this condition: A weak body's defense system, also called the immune system. A condition that affects your lungs and breathing, such as asthma. What are the signs or symptoms? Common symptoms of this condition include: Coughing. This may bring up clear, yellow, or green mucus from your lungs (sputum). Wheezing. Runny or stuffy nose. Having too much mucus  in your lungs (chest congestion). Shortness of breath. Aches and pains, including sore throat or chest. How is this diagnosed? This condition is usually diagnosed based on: Your symptoms and medical history. A physical exam. You may also have other tests, including tests to rule out other conditions, such as pneumonia. These tests include: A test of lung function. Test of a mucus sample to look for the presence of bacteria. Tests to check the oxygen level in your blood. Blood tests. Chest X-ray. How is this treated? Most cases of acute bronchitis clear up over time without treatment. Your health care provider may recommend: Drinking more fluids to help thin your mucus so it is easier to cough up. Taking inhaled medicine (inhaler) to improve air flow in and out of your lungs. Using a vaporizer  or a humidifier. These are machines that add water to the air to help you breathe better. Taking a medicine that thins mucus and clears congestion (expectorant). Taking a medicine that prevents or stops coughing (cough suppressant). It is not common to take an antibiotic medicine for this condition. Follow these instructions at home:  Take over-the-counter and prescription medicines only as told by your health care provider. Use an inhaler, vaporizer, or humidifier as told by your health care provider. Take two teaspoons (10 mL) of honey at bedtime to lessen coughing at night. Drink enough fluid to keep your urine pale yellow. Do not use any products that contain nicotine or tobacco. These products include cigarettes, chewing tobacco, and vaping devices, such as e-cigarettes. If you need help quitting, ask your health care provider. Get plenty of rest. Return to your normal activities as told by your health care provider. Ask your health care provider what activities are safe for you. Keep all follow-up visits. This is important. How is this prevented? To lower your risk of getting this condition  again: Wash your hands often with soap and water for at least 20 seconds. If soap and water are not available, use hand sanitizer. Avoid contact with people who have cold symptoms. Try not to touch your mouth, nose, or eyes with your hands. Avoid breathing in smoke or chemical fumes. Breathing smoke or chemical fumes will make your condition worse. Get the flu shot every year. Contact a health care provider if: Your symptoms do not improve after 2 weeks. You have trouble coughing up the mucus. Your cough keeps you awake at night. You have a fever. Get help right away if you: Cough up blood. Feel pain in your chest. Have severe shortness of breath. Faint or keep feeling like you are going to faint. Have a severe headache. Have a fever or chills that get worse. These symptoms may represent a serious problem that is an emergency. Do not wait to see if the symptoms will go away. Get medical help right away. Call your local emergency services (911 in the [place] Do not drive yourself to the hospital. Summary Acute bronchitis is inflammation of the main airways (bronchi) that come off the windpipe (trachea) in the lungs. The swelling causes the airways to get smaller and make more mucus than normal. Drinking more fluids can help thin your mucus so it is easier to cough up. Take over-the-counter and prescription medicines only as told by your health care provider. Do not use any products that contain nicotine or tobacco. These products include cigarettes, chewing tobacco, and vaping devices, such as e-cigarettes. If you need help quitting, ask your health care provider. Contact a health care provider if your symptoms do not improve after 2 weeks. This information is not intended to replace advice given to you by your health care provider. Make sure you discuss any questions you have with your health care provider. Document Revised: 11/06/2021 Document Reviewed: 11/27/2020 Elsevier Patient  Education  2024 Elsevier Inc.   If you have been instructed to have an in-person evaluation today at a local Urgent Care facility, please use the link below. It will take you to a list of all of our available [facility], including address, phone number and hours of operation. Please do not delay care.  [facility]  If you or a family member do not have a primary care provider, use the link below to schedule a visit and establish care. When  you choose a [facility] primary care physician or advanced practice provider, you gain a long-term partner in health. Find a Primary Care Provider  Learn more about [facility]'s in-office and virtual care options: [facility] - Get Care Now

## 2023-10-05 NOTE — Progress Notes (Signed)
 Virtual Visit Consent   Alex [name], you are scheduled for a virtual visit with a [facility] provider today. Just as with appointments in the office, your consent must be obtained to participate. Your consent will be active for this visit and any virtual visit you may have with one of our providers in the next 365 days. If you have a MyChart account, a copy of this consent can be sent to you electronically.  As this is a virtual visit, video technology does not allow for your provider to perform a traditional examination. This may limit your provider's ability to fully assess your condition. If your provider identifies any concerns that need to be evaluated in person or the need to arrange testing (such as labs, EKG, etc.), we will make arrangements to do so. Although advances in technology are sophisticated, we cannot ensure that it will always work on either your end or our end. If the connection with a video visit is poor, the visit may have to be switched to a telephone visit. With either a video or telephone visit, we are not always able to ensure that we have a secure connection.  By engaging in this virtual visit, you consent to the provision of healthcare and authorize for your insurance to be billed (if applicable) for the services provided during this visit. Depending on your insurance coverage, you may receive a charge related to this service.  I need to obtain your verbal consent now. Are you willing to proceed with your visit today? Alex [name] has provided verbal consent on 10/05/2023 for a virtual visit (video or telephone). Margaretann Loveless, PA-C  Date: 10/05/2023 3:05 PM   Virtual Visit via Video Note   IMargaretann Loveless, connected with  Alex [name]  ([id number], [birth year]) on 10/05/23 at  3:00 PM EST by a video-enabled telemedicine application and verified that I am speaking with the correct person using two identifiers.  Location: Patient: Virtual Visit Location  Patient: Home Provider: Virtual Visit Location Provider: Home Office   I discussed the limitations of evaluation and management by telemedicine and the availability of in person appointments. The patient expressed understanding and agreed to proceed.    History of Present Illness: Alex [name] is a 58 y.o. who identifies as a male who was assigned male at birth, and is being seen today for cough and congestion following a URI.  HPI: URI  This is a new problem. The current episode started 1 to 4 weeks ago (right at one week). The problem has been gradually worsening. Maximum temperature: subjective fever initially with afternoon fevers. Associated symptoms include congestion, coughing (was originally productive but getting more dry now and more frequent), diarrhea (yesterday), headaches, nausea, rhinorrhea (and post nasal drainage) and wheezing (occasional, but feels it has improved). Pertinent negatives include no chest pain, ear pain, plugged ear sensation, sinus pain, sore throat or vomiting. Treatments tried: dayquil, nyquil, mucinex, advil. The treatment provided no relief.     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   Essential (primary) hypertension 12/05/2008   GERD (gastroesophageal reflux disease) 12/05/2008   Panic disorder 08/11/2007   Old myocardial infarct 02/08/2007   HLD  (hyperlipidemia) 03/24/2006   Abnormal LFTs 08/10/2005   CAD (coronary artery disease) 08/11/2003    Allergies:  Allergies  Allergen Reactions   Aleve  [Naproxen Sodium]    Mobic  [Meloxicam]    Voltaren [Diclofenac Sodium] Nausea And Vomiting   Medications:  Current Outpatient Medications:    azithromycin (ZITHROMAX) 250 MG tablet, Take 2 tablets on day 1, then 1 tablet daily on days 2 through 5, Disp: 6 tablet, Rfl: 0   benzonatate (TESSALON) 100 MG capsule, Take 1-2 capsules (100-200 mg total) by mouth 3 (three) times daily as needed., Disp: 30 capsule, Rfl: 0   promethazine-dextromethorphan (PROMETHAZINE-DM) 6.25-15 MG/5ML syrup, Take 5 mLs by mouth 4 (four) times daily as needed., Disp: 118 mL, Rfl: 0   albuterol (VENTOLIN HFA) 108 (90 Base) MCG/ACT inhaler, Inhale 2 puffs into the lungs every 6 (six) hours as needed for wheezing or shortness of breath., Disp: 8 g, Rfl: 2   alpha-1-proteinase inhibitor, human, (PROLASTIN-C) 1000 MG/20ML SOLN, Inject 60 mg/kg into the vein once a week., Disp: , Rfl:    ascorbic acid (VITAMIN C) 500 MG tablet, Take by mouth., Disp: , Rfl:    clopidogrel (PLAVIX) 75 MG tablet, Take 1 tablet (75 mg total) by mouth daily., Disp: 90 tablet, Rfl: 3   Coenzyme Q10 (COQ10) 200 MG CAPS, Take 1 capsule by mouth 2 (two) times daily., Disp: , Rfl:    Continuous Blood Gluc Sensor (FREESTYLE LIBRE 14 DAY SENSOR) MISC, , Disp: , Rfl:    Cyanocobalamin (VITAMIN B 12) 250 MCG LOZG, Take by mouth., Disp: , Rfl:    fluticasone (FLONASE) 50 MCG/ACT nasal spray, Place into the nose., Disp: , Rfl:    Fluticasone-Umeclidin-Vilant (TRELEGY ELLIPTA) 100-62.5-25 MCG/ACT AEPB, Inhale 1 puff into the lungs daily., Disp: 28 each, Rfl: 11   insulin regular human CONCENTRATED (HUMULIN R) 500 UNIT/ML injection, Use up to 250 units (as calculated by U100 syringe) daily via insulin pump, Disp: , Rfl:    ketoconazole (NIZORAL) 2 % shampoo, , Disp: , Rfl:    LORazepam (ATIVAN) 0.5 MG  tablet, 1-2 every 6 hours as neededTake by mouth. 1-2 every 6 hours as needed, Disp: 30 tablet, Rfl: 3   metFORMIN (GLUCOPHAGE) 500 MG tablet, Take 500 mg by mouth 2 (two) times daily. , Disp: , Rfl:    Multiple Vitamin (MULTIVITAMIN) tablet, Take 1 tablet by mouth daily., Disp: , Rfl:    Omega-3 Fatty Acids (FISH OIL) 1000 MG CAPS, Take 1 capsule by mouth 2 (two) times daily. , Disp: , Rfl:    OVER THE COUNTER MEDICATION, Take 600 mg by mouth in the morning and at bedtime. Beet Root, Disp: , Rfl:    pantoprazole (PROTONIX) 40 MG tablet, TAKE ONE TABLET BY MOUTH ONE TIME DAILY, Disp: 90 tablet, Rfl: 4   rosuvastatin (CRESTOR) 40 MG tablet, TAKE ONE TABLET BY MOUTH ONE TIME DAILY, Disp: 90 tablet, Rfl: 0   sertraline (ZOLOFT) 50 MG tablet, Take 1 tablet (50 mg total) by mouth daily., Disp: 90 tablet, Rfl: 3   tamsulosin (FLOMAX) 0.4 MG CAPS capsule, Take 1 capsule (0.4 mg total) by mouth daily., Disp: 30 capsule, Rfl: 0   telmisartan (MICARDIS) 40  MG tablet, Take 1 tablet (40 mg total) by mouth daily., Disp: 90 tablet, Rfl: 3   Observations/Objective: Patient is well-developed, well-nourished in no acute distress.  Resting comfortably at home.  Head is normocephalic, atraumatic.  No labored breathing.  Speech is clear and coherent with logical content.  Patient is alert and oriented at baseline.    Assessment and Plan: 1. Acute bacterial bronchitis (Primary) - azithromycin (ZITHROMAX) 250 MG tablet; Take 2 tablets on day 1, then 1 tablet daily on days 2 through 5  Dispense: 6 tablet; Refill: 0 - promethazine-dextromethorphan (PROMETHAZINE-DM) 6.25-15 MG/5ML syrup; Take 5 mLs by mouth 4 (four) times daily as needed.  Dispense: 118 mL; Refill: 0 - benzonatate (TESSALON) 100 MG capsule; Take 1-2 capsules (100-200 mg total) by mouth 3 (three) times daily as needed.  Dispense: 30 capsule; Refill: 0  - Worsening over a week despite OTC medications - Will treat with Z-pack, Promethazine DM  (drowsiness precautions discussed) and tessalon perles - Can continue Mucinex  - Push fluids.  - Rest.  - Steam and humidifier can help - Seek in person evaluation if worsening or symptoms fail to improve    Follow Up Instructions: I discussed the assessment and treatment plan with the patient. The patient was provided an opportunity to ask questions and all were answered. The patient agreed with the plan and demonstrated an understanding of the instructions.  A copy of instructions were sent to the patient via MyChart unless otherwise noted below.    The patient was advised to call back or seek an in-person evaluation if the symptoms worsen or if the condition fails to improve as anticipated.    Margaretann Loveless, PA-C

## 2023-10-11 ENCOUNTER — Ambulatory Visit: Payer: Self-pay | Admitting: Family Medicine

## 2023-10-11 ENCOUNTER — Telehealth: Payer: Self-pay

## 2023-10-11 NOTE — Telephone Encounter (Signed)
 Chief Complaint: Productive cough Symptoms: nasal/chest congestion, runny nose Frequency: Ongoing x 2 weeks Pertinent Negatives: Patient denies fever, SOB Disposition: [] ED /[] Urgent Care (no appt availability in office) / [x] Appointment(In office/virtual)/ []  [facility]/ [] Home Care/ [] Refused Recommended Disposition /[] [facility]/ []  Follow-up with PCP Additional Notes: Pt reports he completed Z-Pak on Friday and still is experiencing ongoing cough with white/clear sputum, new runny nose, PND and chest/nasal congestion. Denies fever, CP, SOB. Virtual OV scheduled. This RN educated pt on home care, new-worsening symptoms, when to call back/seek emergent care. Pt verbalized understanding and agrees to plan.    Copied from CRM [id number]. Topic: Clinical - Medical Advice >> Oct 11, 2023  3:48 PM Alex [name] wrote: Reason for CRM: patient stated he completed the Zpack on Friday and he still has head/chest congestion and coughing up clear mucus. Patient request script for his symptoms as he didn'[name] want to wait until Wednesday to be seen. Please f/u with patient Reason for Disposition  [1] Taking antibiotic > 7 days AND [2] nasal discharge not improved  Answer Assessment - Initial Assessment Questions 1. ANTIBIOTIC: "What antibiotic are you taking?" "How many times a day?"     Z-Pack 2. ONSET: "When was the antibiotic started?"     Last week, completed on Friday 3. PAIN: "How bad is the sinus pain?"   (Scale 1-10; mild, moderate or severe)   - MILD (1-3): doesn'[name] interfere with normal activities    - MODERATE (4-7): interferes with normal activities (e.g., work or school) or awakens from sleep   - SEVERE (8-10): excruciating pain and patient unable to do any normal activities        None 4. FEVER: "Do you have a fever?" If Yes, ask: "What is it, how was it measured, and when did it start?"      None 5. SYMPTOMS: "Are there any other symptoms you're concerned about?" If Yes,  ask: "When did it start?"     Chest/nasal congestion  Protocols used: Sinus Infection on Antibiotic Follow-up Call-[facility]

## 2023-10-11 NOTE — Telephone Encounter (Signed)
 I have not seen him since December 2023.  We can get him an appointment however, if there are no immediate appointments available he will need to be seen in urgent care.

## 2023-10-11 NOTE — Telephone Encounter (Signed)
  Appointment Request From: Alex [name]   With Provider: Sarina Ser [facility]]   Preferred Date Range: 10/11/2023 - 10/11/2023   Preferred Times: Any Time   Reason for visit: Office Visit   Health Maintenance Topic:    Comments: Was told I had bronchitis, finished the zpac on Saturday still having lots of coughing.

## 2023-10-12 ENCOUNTER — Encounter: Payer: Self-pay | Admitting: Nurse Practitioner

## 2023-10-12 ENCOUNTER — Telehealth: Admitting: Nurse Practitioner

## 2023-10-12 DIAGNOSIS — B9689 Other specified bacterial agents as the cause of diseases classified elsewhere: Secondary | ICD-10-CM | POA: Diagnosis not present

## 2023-10-12 DIAGNOSIS — J208 Acute bronchitis due to other specified organisms: Secondary | ICD-10-CM | POA: Diagnosis not present

## 2023-10-12 MED ORDER — METHYLPREDNISOLONE 4 MG PO TBPK: ORAL_TABLET | ORAL | 0 refills | Status: DC

## 2023-10-12 MED ORDER — PROMETHAZINE-DM 6.25-15 MG/5ML PO SYRP
5.0000 mL | ORAL_SOLUTION | Freq: Four times a day (QID) | ORAL | 0 refills | Status: DC | PRN
Start: 2023-10-12 — End: 2023-11-25

## 2023-10-12 NOTE — Progress Notes (Signed)
 There were no vitals taken for this visit.   Subjective:    Patient ID: Alex [name], male    DOB: [birth year], 58 y.o.   MRN: [id number]  HPI: Alex [name] is a 58 y.o. male  Chief Complaint  Patient presents with   Cough    Patient said he is cough stuff up. Patient feels like its going in the head to the chest. Haven't been running a fever. The cough syrup helps a little but not a lot.    UPPER RESPIRATORY TRACT INFECTION Worst symptom: patient states he has been sick for about 2 weeks at this point.  He was treated with a zpak.  Still coughing and coughing up phlegm at this point.  He felt like he was getting better until Friday or Saturday then symptoms kind of stalled and doesn't feel like there is any improvement at this point.  Fever: no Cough: yes Shortness of breath: no Wheezing: no Chest pain: no Chest tightness: no Chest congestion: no Nasal congestion: yes Runny nose: yes Post nasal drip: yes Sneezing: no Sore throat:  from coughing Swollen glands: no Sinus pressure: no Headache: yes Face pain: no Toothache: no Ear pain: no bilateral Ear pressure: no bilateral Eyes red/itching:no Eye drainage/crusting: no  Vomiting: no Rash: no Fatigue: yes Sick contacts: no Strep contacts: no  Context: fluctuating Recurrent sinusitis: no Relief with OTC cold/cough medications: no  Treatments attempted: antibiotics   Relevant past medical, surgical, family and social history reviewed and updated as indicated. Interim medical history since our last visit reviewed. Allergies and medications reviewed and updated.  Review of Systems  Constitutional:  Negative for fatigue and fever.  HENT:  Positive for congestion, postnasal drip, rhinorrhea and sore throat. Negative for ear pain, sinus pressure, sinus pain and sneezing.   Respiratory:  Positive for cough. Negative for chest tightness, shortness of breath and wheezing.   Gastrointestinal:  Negative for vomiting.  Skin:   Negative for rash.  Neurological:  Positive for headaches.    Per HPI unless specifically indicated above     Objective:    There were no vitals taken for this visit.  Wt Readings from Last 3 Encounters:  04/30/23 233 lb (105.7 kg)  04/27/23 235 lb 4 oz (106.7 kg)  02/17/23 245 lb (111.1 kg)    Physical Exam Vitals and nursing note reviewed.  Constitutional:      General: He is not in acute distress.    Appearance: He is not ill-appearing.  HENT:     Head: Normocephalic.     Right Ear: Hearing normal.     Left Ear: Hearing normal.     Nose: Nose normal.  Pulmonary:     Effort: Pulmonary effort is normal. No respiratory distress.  Neurological:     Mental Status: He is alert.  Psychiatric:        Mood and Affect: Mood normal.        Behavior: Behavior normal.        Thought Content: Thought content normal.        Judgment: Judgment normal.     Results for orders placed or performed during the hospital encounter of 05/17/23  Exercise Tolerance Test   Collection Time: 05/17/23  1:37 PM  Result Value Ref Range   Angina Index 0    Rest HR 82.0 bpm   Rest BP 130/80 mmHg   Exercise duration (min) 8 min   Exercise duration (sec) 0 sec   Estimated  workload 10.1    Peak HR 169 bpm   Peak BP 208/79 mmHg   MPHR 165 bpm   Percent HR 102.0 %   Base ST Depression (mm) 0 mm   [facility] Treadmill Score 8    ST Depression (mm) 0 mm      Assessment & Plan:   Problem List Items Addressed This Visit   None Visit Diagnoses       Acute bacterial bronchitis       Will treat with Prednisone- advised patient that blood sugars will increase. Refilled cough syrup.  recommend OTC antihistamine and neti pot.   Relevant Medications   promethazine-dextromethorphan (PROMETHAZINE-DM) 6.25-15 MG/5ML syrup        Follow up plan: No follow-ups on file.   This visit was completed via MyChart due to the restrictions of the COVID-19 pandemic. All issues as above were discussed and  addressed. Physical exam was done as above through visual confirmation on MyChart. If it was felt that the patient should be evaluated in the office, they were directed there. The patient verbally consented to this visit. Location of the patient: Home Location of the provider: Office Those involved with this call:  Provider: Larae Grooms, NP CMA: Alger Memos, CMA Front Desk/Registration: Servando Snare This encounter was conducted via video.  I spent 20 min dedicated to the care of this patient on the date of this encounter to include previsit review of symptoms, plan of care and follow up, face to face time with the patient, and post visit ordering of testing.

## 2023-10-26 ENCOUNTER — Ambulatory Visit
Admission: RE | Admit: 2023-10-26 | Discharge: 2023-10-26 | Disposition: A | Discharge status: Home / Self Care | Source: Ambulatory Visit | Attending: Pulmonary Disease | Admitting: Pulmonary Disease

## 2023-10-26 ENCOUNTER — Ambulatory Visit: Admitting: Pulmonary Disease

## 2023-10-26 ENCOUNTER — Encounter: Payer: Self-pay | Admitting: Pulmonary Disease

## 2023-10-26 VITALS — BP 138/80 | HR 94 | Temp 97.1°F | Ht 70.0 in | Wt 242.4 lb

## 2023-10-26 DIAGNOSIS — E8801 Alpha-1-antitrypsin deficiency: Secondary | ICD-10-CM

## 2023-10-26 DIAGNOSIS — R0602 Shortness of breath: Secondary | ICD-10-CM | POA: Diagnosis not present

## 2023-10-26 DIAGNOSIS — R058 Other specified cough: Secondary | ICD-10-CM | POA: Insufficient documentation

## 2023-10-26 DIAGNOSIS — R051 Acute cough: Secondary | ICD-10-CM | POA: Insufficient documentation

## 2023-10-26 DIAGNOSIS — R059 Cough, unspecified: Secondary | ICD-10-CM | POA: Diagnosis not present

## 2023-10-26 DIAGNOSIS — R0789 Other chest pain: Secondary | ICD-10-CM | POA: Diagnosis not present

## 2023-10-26 LAB — NITRIC OXIDE: Nitric Oxide: 21

## 2023-10-26 MED ORDER — AZITHROMYCIN 250 MG PO TABS: ORAL_TABLET | ORAL | 0 refills | Status: DC

## 2023-10-26 MED ORDER — COMBIVENT RESPIMAT 20-100 MCG/ACT IN AERS: 1.0000 | INHALATION_SPRAY | Freq: Four times a day (QID) | RESPIRATORY_TRACT | 6 refills | Status: AC | PRN

## 2023-10-26 NOTE — Patient Instructions (Signed)
 VISIT SUMMARY:  Alex [name], a 58 year old male with heterozygous alpha-1 antitrypsin deficiency, came in for a follow-up visit. He is compliant with his regular infusions and has been experiencing a residual cough following a recent illness, intermittent chest pain when bending over, a new 'crunching' sound in his ear, and neck pain with occasional creaking. He has a history of neck problems and shoulder arthritis.  YOUR PLAN:  -HETEROZYGOUS ALPHA-1 ANTITRYPSIN DEFICIENCY: This is a genetic condition that can lead to lung and liver disease. You are doing well with your regular infusions, so please continue with them as prescribed.  -POST-INFECTIOUS COUGH: This is a cough that persists after an infection has cleared. It can last up to eight weeks. You have completed a course of azithromycin and prednisone. You are prescribed a Combivent inhaler to use during coughing episodes or if you experience shortness of breath. A chest x-ray will be done to rule out other causes, and you have a prescription for azithromycin if your cough becomes productive with discolored sputum.  -CHEST PAIN: Your chest pain when bending over is likely due to musculoskeletal issues or possible referred pain from neck arthritis. A chest x-ray will be done to check for any underlying issues. Please follow up with your primary care physician for further evaluation.  -NECK PAIN AND CREPITUS: This refers to the pain and 'creaking' sounds in your neck, possibly due to previous neck issues or arthritis. You should consult with your primary care physician for a detailed evaluation of your cervical spine.  INSTRUCTIONS:  Please follow up with your primary care physician for further evaluation of your chest pain and neck issues. A chest x-ray has been ordered to rule out other causes of your symptoms. Use the Combivent inhaler during coughing episodes or if you experience shortness of breath. If your cough becomes productive with  discolored sputum, use the prescribed azithromycin.

## 2023-10-26 NOTE — Progress Notes (Signed)
 Subjective:    Patient ID: Alex [name], male    DOB: 03-05-1966, 58 y.o.   MRN: [id number]  Patient Care Team: Malva Limes, MD as PCP - General (Family Medicine) Mariah Milling Tollie Pizza, MD as PCP - Cardiology (Cardiology) Solum, Marlana Salvage, MD as Physician Assistant (Internal Medicine) Wyline Mood, MD as Consulting Physician (Gastroenterology) Salena Saner, MD as Consulting Physician (Pulmonary Disease) Antonieta Iba, MD as Consulting Physician (Cardiology) Pa, [facility] (Ophthalmology)  Chief Complaint  Patient presents with   Follow-up    Cough, dry. Wheezing. No SOB. Symptoms started after he was sick a couple of weeks ago.     BACKGROUND: Patient is a 58 year old former smoker (30 PY) who presents for follow-up on the issue of heterozygous alpha 1 antitrypsin deficiency phenotype SZ.  He is on alpha-1 supplementation with Prolastin infusion weekly.  He was last seen here on 20 July 2022.  No major exacerbations or hospitalizations since that time.   HPI Discussed the use of AI scribe software for clinical note transcription with the patient, who gave verbal consent to proceed.  History of Present Illness   Alex [name] is a 58 year old male with heterozygous alpha 1 antitrypsin deficiency, phenotype SZ, who presents for follow-up after a 2-year hiatus.  He is currently receiving regular infusions for his heterozygous alpha 1 antitrypsin deficiency and has not missed any treatments. He remains compliant with this regimen.  He has been experiencing a residual non-productive cough following a recent upper respiratory illness. Initially, the cough was associated with 'a little white sludgy' sputum. He completed a course of azithromycin (Z-Pak) and prednisone approximately four to five days ago. No current shortness of breath.  He experiences chest pain when bending down, described as pain through the chest and sometimes in the center of his back. He underwent  a treadmill test in October, which he passed. No heartburn is reported.  He mentions a new symptom of 'crunching' in his ear, which is a recent development.  He has a history of neck problems and arthritis in the shoulder, with occasional 'creaking' in the neck. He recalls a previous shoulder x-ray that showed arthritis.  He has been prescribed albuterol but does not use it regularly due to feeling fine and experiencing adverse effects when he did use it.      Review of Systems A 10 point review of systems was performed and it is as noted above otherwise negative.   Past Medical History:  Diagnosis Date   Allergy    Anxiety    Cirrhosis (HCC)    Diabetes mellitus without complication (HCC)    GERD (gastroesophageal reflux disease)    History of chicken pox    Hypertension    Myocardial infarction [facility])    08    Past Surgical History:  Procedure Laterality Date   COLONOSCOPY WITH PROPOFOL N/A 12/23/2020   Procedure: COLONOSCOPY WITH PROPOFOL;  Surgeon: Wyline Mood, MD;  Location: [facility] ENDOSCOPY;  Service: Gastroenterology;  Laterality: N/A;   ESOPHAGOGASTRODUODENOSCOPY (EGD) WITH PROPOFOL N/A 05/01/2019   Procedure: ESOPHAGOGASTRODUODENOSCOPY (EGD) WITH PROPOFOL;  Surgeon: Wyline Mood, MD;  Location: [facility] ENDOSCOPY;  Service: Gastroenterology;  Laterality: N/A;   heart artery stent  2008   KNEE SURGERY     Myocrasial Perfusion Scan  09/13/2011   Abnormal images. Not thought to be significant per Dr. 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   Essential (primary) hypertension 12/05/2008   GERD (gastroesophageal reflux disease) 12/05/2008   Panic disorder 08/11/2007   Old myocardial infarct 02/08/2007   HLD (hyperlipidemia) 03/24/2006   Abnormal LFTs 08/10/2005   CAD (coronary artery disease) 08/11/2003    Family History  Problem Relation Age of Onset   Hypertension Mother    Diabetes Mother        insulin dependent   Hyperlipidemia Mother    Cancer Mother    Cirrhosis Father    Diabetes Father    Stroke Father    Diabetes Brother    Diabetes Other    Hypertension Other    Heart disease Other    Arthritis Other     Social History   Tobacco Use   Smoking status: Former    Current packs/day: 0.00    Average packs/day: 1.5 packs/day for 20.0 years (30.0 ttl pk-yrs)    Types: Cigarettes    Start date: 08/10/1986    Quit date: 08/10/2006    Years since quitting: 17.2   Smokeless tobacco: Never  Substance Use Topics   Alcohol use: No    Allergies  Allergen Reactions   Aleve  [Naproxen Sodium]    Mobic  [Meloxicam]    Voltaren [Diclofenac Sodium] Nausea And Vomiting    Current Meds  Medication Sig   ascorbic acid (VITAMIN C) 500 MG tablet Take by mouth.   azithromycin (ZITHROMAX) 250 MG tablet Take 2 tablets (500 mg) on  Day 1,  followed by 1 tablet (250 mg) once daily on Days 2 through 5.   clopidogrel (PLAVIX) 75 MG tablet Take 1 tablet (75 mg total) by mouth daily.   Coenzyme Q10 (COQ10) 200 MG CAPS Take 1 capsule by mouth 2 (two) times daily.   Continuous Blood Gluc Sensor (FREESTYLE LIBRE 14 DAY SENSOR) MISC    Cyanocobalamin (VITAMIN B 12) 250 MCG LOZG Take by mouth.   fluticasone (FLONASE) 50 MCG/ACT nasal spray Place into the nose.   insulin regular human CONCENTRATED (HUMULIN R) 500 UNIT/ML injection Use up to 250  units (as calculated by U100 syringe) daily via insulin pump   Ipratropium-Albuterol (COMBIVENT RESPIMAT) 20-100 MCG/ACT AERS respimat Inhale 1 puff into the lungs every 6 (six) hours as needed for wheezing (cough,shortness of breath).   ketoconazole (NIZORAL) 2 % shampoo    LORazepam (ATIVAN) 0.5 MG tablet 1-2 every 6 hours as neededTake by mouth. 1-2 every 6 hours as needed   metFORMIN (GLUCOPHAGE) 500 MG tablet Take 500 mg by mouth 2 (two) times daily.    Multiple Vitamin (MULTIVITAMIN) tablet Take 1 tablet by mouth daily.   Omega-3 Fatty Acids (FISH OIL) 1000 MG CAPS Take 1 capsule by mouth 2 (two) times daily.    OVER THE COUNTER MEDICATION Take 600 mg by mouth in the morning and at bedtime. Beet Root   pantoprazole (PROTONIX) 40 MG tablet TAKE ONE TABLET BY MOUTH ONE TIME DAILY  rosuvastatin (CRESTOR) 40 MG tablet TAKE ONE TABLET BY MOUTH ONE TIME DAILY   sertraline (ZOLOFT) 50 MG tablet Take 1 tablet (50 mg total) by mouth daily.   tamsulosin (FLOMAX) 0.4 MG CAPS capsule Take 1 capsule (0.4 mg total) by mouth daily.   telmisartan (MICARDIS) 40 MG tablet Take 1 tablet (40 mg total) by mouth daily.    Immunization History  Administered Date(s) Administered   Hep A / Hep B 04/06/2019, 05/04/2019, 09/03/2022   Influenza,inj,Quad PF,6+ Mos 04/20/2018, 04/10/2019   Influenza-Unspecified 04/28/2017   Pneumococcal Polysaccharide-23 07/23/2011   Tdap 07/23/2011   Zoster Recombinant(Shingrix) 04/20/2018, 06/22/2018        Objective:     BP 138/80 (BP Location: Right Arm, Cuff Size: Large)   Pulse 94   Temp (!) 97.1 F (36.2 C)   Ht 5\' 10"  (1.778 m)   Wt 242 lb 6.4 oz (110 kg)   SpO2 98%   BMI 34.78 kg/m   SpO2: 98 % O2 Device: None (Room air)  GENERAL: Overweight gentleman, fully ambulatory, no distress, no conversational dyspnea.   HEAD: Normocephalic, atraumatic.  EYES: Pupils equal, round, reactive to light.  No scleral icterus.  MOUTH: Oral mucosa moist, no thrush.   Dentition intact.   NECK: Supple. No thyromegaly. No nodules. No JVD.  Trachea midline, phonation normal. PULMONARY: Lungs with coarse breath sounds throughout.  End expiratory wheezing noted. CARDIOVASCULAR: S1 and S2. Regular rate and rhythm.  Grade 1/6 to 2/6 systolic ejection murmur consistent with mitral regurgitation. GASTROINTESTINAL: Benign. MUSCULOSKELETAL: No joint swelling, no clubbing, no edema.  NEUROLOGIC: Awake alert, no focal deficits. SKIN: Intact,warm,dry.  No rashes noted on limited exam. PSYCH: Mood and behavior normal.   Lab Results  Component Value Date   NITRICOXIDE 21 10/26/2023     Assessment & Plan:     ICD-10-CM   1. AAT (alpha-1-antitrypsin) deficiency (HCC) - Phenotype SZ  E88.01 Nitric oxide    2. Acute cough  R05.1 DG Chest 2 View    Nitric oxide    3. Post-viral cough syndrome  R05.8 DG Chest 2 View    4. Chest pain, atypical  R07.89       Orders Placed This Encounter  Procedures   DG Chest 2 View    Standing Status:   Future    Number of Occurrences:   1    Expiration Date:   10/25/2024    Reason for Exam (SYMPTOM  OR DIAGNOSIS REQUIRED):   Cough,shortness of breath    Preferred imaging location?:   [facility]   Nitric oxide    Meds ordered this encounter  Medications   Ipratropium-Albuterol (COMBIVENT RESPIMAT) 20-100 MCG/ACT AERS respimat    Sig: Inhale 1 puff into the lungs every 6 (six) hours as needed for wheezing (cough,shortness of breath).    Dispense:  4 g    Refill:  6   azithromycin (ZITHROMAX) 250 MG tablet    Sig: Take 2 tablets (500 mg) on  Day 1,  followed by 1 tablet (250 mg) once daily on Days 2 through 5.    Dispense:  6 each    Refill:  0   Discussion:    Heterozygous alpha-1 antitrypsin deficiency He is adherent to regular infusions with no reported issues. - Continue regular infusions for alpha-1 antitrypsin deficiency.  Post-infectious cough Residual cough persists following recent illness,  consistent with post-infectious cough. No airway inflammation. Symptoms may persist up to eight weeks. Completed azithromycin and prednisone course. -  Prescribe Combivent inhaler for use during coughing episodes or dyspnea. - Order chest x-ray to rule out other causes of symptoms. - Provide azithromycin prescription for use if cough becomes productive with discolored sputum. - Follow up in 3 to 4 weeks.  Chest pain Intermittent chest pain when bending over, localized to the sternum. Differential includes musculoskeletal pain or referred pain from pinched nerve.  Not a new issue.  No recent cardiac issues post-treadmill test in October which was reportedly normal. - Order chest x-ray to evaluate for underlying issues. - Advise follow-up with primary care physician for evaluation of potential musculoskeletal or neurological causes.  Neck pain and crepitus Reports neck pain and crepitus, possibly related to previous neck issues. Differential includes cervical spine pathology or arthritis. - Consult with primary care physician for cervical spine evaluation.     Advised if symptoms do not improve or worsen, to please contact office for sooner follow up or seek emergency care.    I spent 40 minutes of dedicated to the care of this patient on the date of this encounter to include pre-visit review of records, face-to-face time with the patient discussing conditions above, post visit ordering of testing, clinical documentation with the electronic health record, making appropriate referrals as documented, and communicating necessary findings to members of the patients care team.   C. Danice Goltz, MD Advanced Bronchoscopy P[facility]-    *This note was dictated using voice recognition software/Dragon.  Despite best efforts to proofread, errors can occur which can change the meaning. Any transcriptional errors that result from this process are unintentional and may not be fully  corrected at the time of dictation.

## 2023-10-28 ENCOUNTER — Telehealth: Payer: Self-pay | Admitting: Gastroenterology

## 2023-10-28 NOTE — Telephone Encounter (Signed)
 The patient's wife called to schedule her husband for his standard follow-up visit with Dr. Tobi Bastos.

## 2023-11-10 ENCOUNTER — Encounter: Payer: Self-pay | Admitting: Pulmonary Disease

## 2023-11-11 ENCOUNTER — Telehealth: Payer: Self-pay | Admitting: Pharmacist

## 2023-11-11 NOTE — Telephone Encounter (Signed)
 Provided verbal rx to [facility] for Prolastin for double dose of 120mg /kg every other week if needed for travel  Chesley Mires, PharmD, MPH, BCPS, CPP Clinical Pharmacist (Rheumatology and Pulmonology)

## 2023-11-17 ENCOUNTER — Ambulatory Visit: Admitting: Family Medicine

## 2023-11-25 ENCOUNTER — Other Ambulatory Visit: Payer: Self-pay

## 2023-12-02 ENCOUNTER — Telehealth: Payer: Self-pay

## 2023-12-02 ENCOUNTER — Ambulatory Visit (INDEPENDENT_AMBULATORY_CARE_PROVIDER_SITE_OTHER): Admitting: Gastroenterology

## 2023-12-02 VITALS — BP 143/77 | HR 78 | Temp 97.8°F | Wt 239.0 lb

## 2023-12-02 DIAGNOSIS — K760 Fatty (change of) liver, not elsewhere classified: Secondary | ICD-10-CM

## 2023-12-02 DIAGNOSIS — K7469 Other cirrhosis of liver: Secondary | ICD-10-CM

## 2023-12-02 DIAGNOSIS — E8801 Alpha-1-antitrypsin deficiency: Secondary | ICD-10-CM

## 2023-12-02 DIAGNOSIS — R772 Abnormality of alphafetoprotein: Secondary | ICD-10-CM

## 2023-12-02 DIAGNOSIS — R160 Hepatomegaly, not elsewhere classified: Secondary | ICD-10-CM

## 2023-12-02 NOTE — Telephone Encounter (Signed)
 Called patient but had to leave him a detailed voicemail letting know that Dr. Antony Baumgartner decided on doing a MRI instead of an ultrasound. Patient will see the appointment on his MyChart.

## 2023-12-02 NOTE — Progress Notes (Signed)
 Luke Salaam MD, MRCP(U.K) [address]  [address]  [place], [place] [address]  Main: [phone]  Fax: [phone]   Primary Care Physician: Lamon Pillow, MD  Primary Gastroenterologist:  Dr. Luke Salaam   Chief Complaint  Patient presents with   CIRRHOSIS OF THE LIVER    HPI: Alex [name] is a 58 y.o. male Summary of history :   Last seen at the office in 08/2022: Prior to that seen in 03/2019 where he was referred for cirrhosis of the liver.  He has interstitial lung disease and on CT scan of the chest was noted to have possible liver cirrhosis. He follows with Dr. 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 alpha-1 antitrypsin levels were very low on evaluation at 77 liver elastography showed features of F2 and F3 fibrosis he had lost weight on advice.  He was not immune to hepatitis a and B otherwise autoimmune markers were negative.   EGD in 2020 to screen for esophageal varices showed erythema in the gastric antrum otherwise no varices were noted. 12/23/2020 had a screening colonoscopy to 7 to 9 mm polyps in the sigmoid and ascending colon was snared.  Bowel prep was unsatisfactory hence repeat colonoscopy in 6 months recommended the polyps were tubular adenoma.  He was subsequently lost to follow-up.     03/25/2023: RUQ USG: steatosis no cirrhosis - no liver mass 09/03/2022: MELD -8     Interval history 04/27/2023-12/02/2023   Since last visit did not obtain EGD or colonoscopy as recommended.   09/21/2023: Creatinine 0.9 total bilirubin 1.0 albumin 4.7.  In 04/30/2023 INR 1.1 AFP of 17.9 and [place] FibroSure suggestive of F3 fibrosis. No recent ultrasound of the abdomen He is doing well with no new complaints.  Trying to lose weight. Current Outpatient Medications  Medication Sig Dispense Refill   alpha-1-proteinase inhibitor, human, (PROLASTIN-C) 1000 MG/20ML SOLN Inject 60  mg/kg into the vein once a week.     ascorbic acid (VITAMIN C) 500 MG tablet Take by mouth.     clopidogrel  (PLAVIX ) 75 MG tablet Take 1 tablet (75 mg total) by mouth daily. 90 tablet 3   Coenzyme Q10 (COQ10) 200 MG CAPS Take 1 capsule by mouth 2 (two) times daily.     Continuous Blood Gluc Sensor (FREESTYLE LIBRE 14 DAY SENSOR) MISC      Cyanocobalamin (VITAMIN B 12) 250 MCG LOZG Take by mouth.     fluticasone (FLONASE) 50 MCG/ACT nasal spray Place into the nose.     Fluticasone-Umeclidin-Vilant (TRELEGY ELLIPTA ) 100-62.5-25 MCG/ACT AEPB Inhale 1 puff into the lungs daily. 28 each 11   HYDROcodone  bit-homatropine (HYCODAN) 5-1.5 MG/5ML syrup TAKE BY MOUTH EVERY EIGHT HOURS AS NEEDED FOR COUGH     insulin  regular human CONCENTRATED (HUMULIN R ) 500 UNIT/ML injection Use up to 250 units (as calculated by U100 syringe) daily via insulin  pump     Ipratropium-Albuterol  (COMBIVENT  RESPIMAT) 20-100 MCG/ACT AERS respimat Inhale 1 puff into the lungs every 6 (six) hours as needed for wheezing (cough,shortness of breath). 4 g 6   ketoconazole (NIZORAL) 2 % shampoo      LORazepam  (ATIVAN ) 0.5 MG tablet 1-2 every 6 hours as neededTake by mouth. 1-2 every 6 hours as needed 30 tablet 3   metFORMIN (GLUCOPHAGE) 500 MG tablet Take 500 mg by mouth 2 (two) times daily.      Multiple Vitamin (  MULTIVITAMIN) tablet Take 1 tablet by mouth daily.     Omega-3 Fatty Acids (FISH OIL) 1000 MG CAPS Take 1 capsule by mouth 2 (two) times daily.      OVER THE COUNTER MEDICATION Take 600 mg by mouth in the morning and at bedtime. Beet Root     pantoprazole  (PROTONIX ) 40 MG tablet TAKE ONE TABLET BY MOUTH ONE TIME DAILY 90 tablet 4   rosuvastatin  (CRESTOR ) 40 MG tablet TAKE ONE TABLET BY MOUTH ONE TIME DAILY 90 tablet 0   sertraline  (ZOLOFT ) 50 MG tablet Take 1 tablet (50 mg total) by mouth daily. 90 tablet 3   tamsulosin  (FLOMAX ) 0.4 MG CAPS capsule Take 1 capsule (0.4 mg total) by mouth daily. 30 capsule 0   telmisartan   (MICARDIS ) 40 MG tablet Take 1 tablet (40 mg total) by mouth daily. 90 tablet 3   No current facility-administered medications for this visit.    Allergies as of 12/02/2023 - Review Complete 12/02/2023  Allergen Reaction Noted   Aleve  [naproxen sodium]  01/29/2015   Mobic  [meloxicam]  01/29/2015   Voltaren [diclofenac sodium] Nausea And Vomiting 01/29/2015     ROS:  General: Negative for anorexia, weight loss, fever, chills, fatigue, weakness. ENT: Negative for hoarseness, difficulty swallowing , nasal congestion. CV: Negative for chest pain, angina, palpitations, dyspnea on exertion, peripheral edema.  Respiratory: Negative for dyspnea at rest, dyspnea on exertion, cough, sputum, wheezing.  GI: See history of present illness. GU:  Negative for dysuria, hematuria, urinary incontinence, urinary frequency, nocturnal urination.  Endo: Negative for unusual weight change.    Physical Examination:   BP (!) 143/77   Pulse 78   Temp 97.8 F (36.6 C) (Oral)   Wt 239 lb (108.4 kg)   BMI 34.29 kg/m   General: Well-nourished, well-developed in no acute distress.  Eyes: No icterus. Conjunctivae pink. Neuro: Alert and oriented x 3.  Grossly intact. Skin: Warm and dry, no jaundice.   Psych: Alert and cooperative, normal mood and affect.   Imaging Studies: No results found.  Assessment and Plan:   Alex [name] is a 58 y.o. y/o male here to follow up for liver cirrhosis , compensated - MELD 8 in 04/30/2023 follow-up lost to follow-up for over 3 years.  Has alpha and antitrypsin 1 deficiency.  Prior colonoscopy was incomplete due to poor prep.  Liver functions are normal.  INR 1.1 with synthetic function     Plan 1.  EGD to screen for esophageal varices, colonoscopy due to poor prep previously ordered but not completed he says that he is aware he is getting a new insurance and will call our office to schedule the same in a few months time. 2.  With elevated AFP,  I recommend MRI of  the liver to rule out liver mass 3.  Complete hepatitis a and B vaccination 4.  For fatty liver disease suggest to lose weight exercise j,Mediterranean diet, low salt in diet 5.  Continue to follow-up with Dr. Shann Darnel to have cardiovascular risk factors screened and addressed as part of comprehensive management for fatty liver disease .  Prior imaging of the liver had shown liver cirrhosis particularly in 2024 but recent ultrasound in 2024 showed no evidence of cirrhosis.  At his follow-up visit we need to decide whether he does or does not have cirrhosis and if needed obtain a liver biopsy or MRI VCE which is available at [facility] as if he only has steatosis and no cirrhosis he  may be eligible for the drug Resdiffra can be used to treat steatohepatitis from fatty liver   I have informed the patient that I am moving to a new practice he is welcome to follow-up at [facility] gastroenterology or call my future office to follow-up with me the choice would be his  Dr Luke Salaam  MD,MRCP [facility]) Follow up in 2-3 months

## 2023-12-02 NOTE — Addendum Note (Signed)
 Addended by: Hershell Lose on: 12/02/2023 05:28 PM   Modules accepted: Orders

## 2023-12-02 NOTE — Patient Instructions (Addendum)
 [facility] and have your [facility] done on 12/08/2023 at 9:45 AM arrival. Please do not eat or drink anything after midnight.

## 2023-12-08 ENCOUNTER — Encounter: Payer: Self-pay | Admitting: Family Medicine

## 2023-12-08 ENCOUNTER — Encounter: Payer: Self-pay | Admitting: Gastroenterology

## 2023-12-08 ENCOUNTER — Ambulatory Visit (INDEPENDENT_AMBULATORY_CARE_PROVIDER_SITE_OTHER): Admitting: Family Medicine

## 2023-12-08 ENCOUNTER — Ambulatory Visit
Admission: RE | Admit: 2023-12-08 | Discharge: 2023-12-08 | Disposition: A | Discharge status: Home / Self Care | Source: Ambulatory Visit | Attending: Family Medicine | Admitting: Family Medicine

## 2023-12-08 ENCOUNTER — Other Ambulatory Visit

## 2023-12-08 VITALS — BP 155/71 | HR 75 | Temp 97.7°F | Resp 16 | Ht 70.0 in | Wt 242.2 lb

## 2023-12-08 DIAGNOSIS — R079 Chest pain, unspecified: Secondary | ICD-10-CM

## 2023-12-08 DIAGNOSIS — G8929 Other chronic pain: Secondary | ICD-10-CM

## 2023-12-08 DIAGNOSIS — M546 Pain in thoracic spine: Secondary | ICD-10-CM | POA: Diagnosis not present

## 2023-12-08 MED ORDER — DIAZEPAM 10 MG PO TABS: ORAL_TABLET | ORAL | 0 refills | Status: AC

## 2023-12-09 ENCOUNTER — Ambulatory Visit
Admission: RE | Admit: 2023-12-09 | Discharge: 2023-12-09 | Disposition: A | Discharge status: Home / Self Care | Source: Ambulatory Visit | Attending: Gastroenterology | Admitting: Gastroenterology

## 2023-12-09 ENCOUNTER — Other Ambulatory Visit: Payer: Self-pay

## 2023-12-09 DIAGNOSIS — R160 Hepatomegaly, not elsewhere classified: Secondary | ICD-10-CM | POA: Insufficient documentation

## 2023-12-09 DIAGNOSIS — K7460 Unspecified cirrhosis of liver: Secondary | ICD-10-CM | POA: Diagnosis not present

## 2023-12-09 DIAGNOSIS — K760 Fatty (change of) liver, not elsewhere classified: Secondary | ICD-10-CM | POA: Diagnosis not present

## 2023-12-09 DIAGNOSIS — N261 Atrophy of kidney (terminal): Secondary | ICD-10-CM | POA: Diagnosis not present

## 2023-12-09 DIAGNOSIS — R161 Splenomegaly, not elsewhere classified: Secondary | ICD-10-CM | POA: Diagnosis not present

## 2023-12-09 MED ORDER — ALPRAZOLAM 0.5 MG PO TABS: 0.5000 mg | ORAL_TABLET | Freq: Once | ORAL | 0 refills | Status: AC

## 2023-12-09 MED ORDER — GADOBUTROL 1 MMOL/ML IV SOLN
10.0000 mL | Freq: Once | INTRAVENOUS | Status: AC | PRN
  Administered 2023-12-09: 10 mL via INTRAVENOUS

## 2023-12-10 ENCOUNTER — Other Ambulatory Visit: Payer: Self-pay | Admitting: Family Medicine

## 2023-12-10 ENCOUNTER — Telehealth: Payer: Self-pay

## 2023-12-10 ENCOUNTER — Encounter: Payer: Self-pay | Admitting: Family Medicine

## 2023-12-10 DIAGNOSIS — R079 Chest pain, unspecified: Secondary | ICD-10-CM

## 2023-12-10 NOTE — Telephone Encounter (Signed)
 Copied from CRM [id number]. Topic: Clinical - Request for Lab/Test Order >> Dec 10, 2023 10:05 AM Elle L wrote: Reason for CRM: [facility], [phone], called to request a prior authorization for the patient's chest CT. >> Dec 10, 2023 10:15 AM Carole Churches wrote: [facility]  Phone # [phone] xt [phone]    Caller wanted to inform she has completed the PA and no further action needed to be done.

## 2023-12-13 ENCOUNTER — Encounter: Payer: Self-pay | Admitting: Family Medicine

## 2023-12-14 ENCOUNTER — Ambulatory Visit
Admission: RE | Admit: 2023-12-14 | Discharge: 2023-12-14 | Disposition: A | Discharge status: Home / Self Care | Source: Ambulatory Visit | Attending: Pulmonary Disease | Admitting: Pulmonary Disease

## 2023-12-14 ENCOUNTER — Encounter: Payer: Self-pay | Admitting: Pulmonary Disease

## 2023-12-14 ENCOUNTER — Ambulatory Visit: Admitting: Pulmonary Disease

## 2023-12-14 ENCOUNTER — Ambulatory Visit (INDEPENDENT_AMBULATORY_CARE_PROVIDER_SITE_OTHER): Admitting: Pulmonary Disease

## 2023-12-14 VITALS — BP 142/80 | HR 76 | Temp 97.7°F | Ht 70.0 in | Wt 241.6 lb

## 2023-12-14 DIAGNOSIS — E1129 Type 2 diabetes mellitus with other diabetic kidney complication: Secondary | ICD-10-CM | POA: Diagnosis not present

## 2023-12-14 DIAGNOSIS — R0602 Shortness of breath: Secondary | ICD-10-CM | POA: Diagnosis not present

## 2023-12-14 DIAGNOSIS — E1159 Type 2 diabetes mellitus with other circulatory complications: Secondary | ICD-10-CM | POA: Diagnosis not present

## 2023-12-14 DIAGNOSIS — K7469 Other cirrhosis of liver: Secondary | ICD-10-CM

## 2023-12-14 DIAGNOSIS — R202 Paresthesia of skin: Secondary | ICD-10-CM | POA: Diagnosis not present

## 2023-12-14 DIAGNOSIS — Z9641 Presence of insulin pump (external) (internal): Secondary | ICD-10-CM | POA: Diagnosis not present

## 2023-12-14 DIAGNOSIS — M542 Cervicalgia: Secondary | ICD-10-CM

## 2023-12-14 DIAGNOSIS — E8801 Alpha-1-antitrypsin deficiency: Secondary | ICD-10-CM | POA: Diagnosis not present

## 2023-12-14 DIAGNOSIS — R809 Proteinuria, unspecified: Secondary | ICD-10-CM | POA: Diagnosis not present

## 2023-12-14 NOTE — Progress Notes (Signed)
 Established patient visit   Patient: Alex [name]   DOB: [birth year]   58 y.o. Male  MRN: [id number] Visit Date: 12/08/2023  Today's healthcare provider: Jeralene Mom, MD   Chief Complaint  Patient presents with   Chest Pain    Back pain in the middle and chest pain (mild) reports that on his way here the pain almost made him tear up. Feels like it may be a nerve. Reports that his hands are numb. Patient reports that he stopped his car and took 2 advils. Reports the pain is 80% better.   Subjective    Discussed the use of AI scribe software for clinical note transcription with the patient, who gave verbal consent to proceed.  History of Present Illness   Alex [name] is a 58 year old male who presents with chest and back pain radiating to the elbows.  He experiences constant and severe chest and back pain radiating to both elbows, accompanied by tingling in his fingers. The pain intensifies during physical activities such as climbing steps, causing a sensation of impending doom. He took two Advil , which provided some relief, but the pain persisted. The pain started to ease while sitting in his truck before the appointment. He also experiences central chest pain after sitting in a recliner for 15-20 minutes, which he attributes to posture. This discomfort has limited his ability to engage in activities like putt-putt golf. No significant shortness of breath beyond his usual exertional dyspnea, such as when running or climbing stairs.  He has been experiencing back pain down his spine for about a year, described as 'pinching nerves.' The pain is located about halfway down his spine and is sometimes accompanied by a crunching sensation in his neck. He occasionally takes Advil  for the pain, which provides minimal relief. He also reports a sensation in his arm that feels like it is 'trying to go to sleep.'  He also mentions a history of elevated tumor markers and concerns about cirrhosis for  which he is seeing Dr. Antony Baumgartner. He is scheduled for abdominal MRI tomorrow. His father died of cirrhosis, although he was not a drinker, and he himself does not drink.       Medications: Outpatient Medications Prior to Visit  Medication Sig   alpha-1-proteinase inhibitor, human, (PROLASTIN-C) 1000 MG/20ML SOLN Inject 60 mg/kg into the vein once a week.   ascorbic acid (VITAMIN C) 500 MG tablet Take by mouth.   clopidogrel  (PLAVIX ) 75 MG tablet Take 1 tablet (75 mg total) by mouth daily.   Coenzyme Q10 (COQ10) 200 MG CAPS Take 1 capsule by mouth 2 (two) times daily.   Continuous Blood Gluc Sensor (FREESTYLE LIBRE 14 DAY SENSOR) MISC    Cyanocobalamin (VITAMIN B 12) 250 MCG LOZG Take by mouth.   fluticasone (FLONASE) 50 MCG/ACT nasal spray Place into the nose.   Fluticasone-Umeclidin-Vilant (TRELEGY ELLIPTA ) 100-62.5-25 MCG/ACT AEPB Inhale 1 puff into the lungs daily.   insulin  regular human CONCENTRATED (HUMULIN R ) 500 UNIT/ML injection Use up to 250 units (as calculated by U100 syringe) daily via insulin  pump   Ipratropium-Albuterol  (COMBIVENT  RESPIMAT) 20-100 MCG/ACT AERS respimat Inhale 1 puff into the lungs every 6 (six) hours as needed for wheezing (cough,shortness of breath).   ketoconazole (NIZORAL) 2 % shampoo    LORazepam  (ATIVAN ) 0.5 MG tablet 1-2 every 6 hours as neededTake by mouth. 1-2 every 6 hours as needed   metFORMIN (GLUCOPHAGE) 500 MG tablet Take 500 mg by mouth  2 (two) times daily.    Multiple Vitamin (MULTIVITAMIN) tablet Take 1 tablet by mouth daily.   Omega-3 Fatty Acids (FISH OIL) 1000 MG CAPS Take 1 capsule by mouth 2 (two) times daily.    OVER THE COUNTER MEDICATION Take 600 mg by mouth in the morning and at bedtime. Beet Root   pantoprazole  (PROTONIX ) 40 MG tablet TAKE ONE TABLET BY MOUTH ONE TIME DAILY   rosuvastatin  (CRESTOR ) 40 MG tablet TAKE ONE TABLET BY MOUTH ONE TIME DAILY   sertraline  (ZOLOFT ) 50 MG tablet Take 1 tablet (50 mg total) by mouth daily.    tamsulosin  (FLOMAX ) 0.4 MG CAPS capsule Take 1 capsule (0.4 mg total) by mouth daily.   telmisartan  (MICARDIS ) 40 MG tablet Take 1 tablet (40 mg total) by mouth daily.   [DISCONTINUED] HYDROcodone  bit-homatropine (HYCODAN) 5-1.5 MG/5ML syrup TAKE BY MOUTH EVERY EIGHT HOURS AS NEEDED FOR COUGH   No facility-administered medications prior to visit.      Objective    BP (!) 155/71 (BP Location: Left Arm, Patient Position: Sitting, Cuff Size: Normal)   Pulse 75   Temp 97.7 F (36.5 C) (Oral)   Resp 16   Ht 5\' 10"  (1.778 m)   Wt 242 lb 3.2 oz (109.9 kg)   SpO2 99%   BMI 34.75 kg/m   Physical Exam   General: Appearance:    Mildly obese male in no acute distress  Eyes:    PERRL, conjunctiva/corneas clear, EOM's intact       Lungs:     Clear to auscultation bilaterally, respirations unlabored  Heart:    Normal heart rate. Normal rhythm. No murmurs, rubs, or gallops.    MS:   All extremities are intact.  No chest wall tenderness. Unable to reproduce pain  Neurologic:   Awake, alert, oriented x 3. No apparent focal neurological defect.        EKG - NSR, no ischemic changes    No results found for any visits on 12/08/23.  Assessment & Plan       Intermittent episodes of severe non-exertional chest pain radiating into back, concerning for aortic pathology such as aneurysm. Not c/w cardiac pain. Will obtain plain film chest x-ray and anticipate advanced imaging if no other explanation seen on Xray.    Alpha-1 antitrypsin deficiency Potential contributor to liver issues, MRI planned to assess for liver disease per Dr. Marzette Solders, MD  [facility] [phone] (phone) [phone] (fax)  [facility]

## 2023-12-14 NOTE — Patient Instructions (Signed)
 VISIT SUMMARY:  Today, you were seen for your back pain and concerns about ongoing diagnostic evaluations. We discussed your severe back pain, which began recently and is located between your shoulder blades, sometimes radiating around your chest and causing tingling in your hands. You also experience nerve pain radiating down your arms. We reviewed your history of alpha-1 antitrypsin deficiency, cirrhosis, and fatty liver, and discussed your anxiety related to medical procedures.  YOUR PLAN:  -ALPHA-1 ANTITRYPSIN DEFICIENCY: Alpha-1 antitrypsin deficiency is a genetic condition that can affect the lungs and liver. Your condition is currently stable with regular infusions, and you are not experiencing any respiratory issues. We will continue your infusions and order pulmonary function tests before your next visit.  -CIRRHOSIS OF LIVER: Cirrhosis is a condition where the liver is scarred and its function is impaired. Your MRI confirmed cirrhosis, but there has been no progression compared to previous evaluations. No new interventions are needed at this time.  -FATTY LIVER: Fatty liver is a condition where fat builds up in the liver, often related to lifestyle factors. There is no need for acute intervention at this time.  -CERVICAL SPINE ISSUES: Cervical radiculopathy is a condition where nerve roots in the neck are compressed, causing pain and tingling. We suspect this due to your symptoms and will order cervical spine x-rays to evaluate any structural issues.  -MUSCLE PAIN AND TIGHTNESS: Your muscle pain and tightness between your shoulder blades may be due to muscle knots. We recommend massage therapy to help relieve this discomfort.  INSTRUCTIONS:  Please continue with your regular alpha-1 antitrypsin infusions and use Combivent  as needed for shortness of breath. We will order pulmonary function tests before your next visit. Additionally, we will schedule cervical spine x-rays to evaluate your  neck pain and tingling in your hands. Consider massage therapy for relief of muscle tightness between your shoulder blades. Follow up with your primary care provider to coordinate your care and ensure all your healthcare providers are communicating effectively.

## 2023-12-14 NOTE — Progress Notes (Unsigned)
 Subjective:    Patient ID: Alex [name], male    DOB: May 11, 1966, 58 y.o.   MRN: [id number]  Patient Care Team: Lamon Pillow, MD as PCP - General (Family Medicine) Jerelene Monday Deadra Everts, MD as PCP - Cardiology (Cardiology) Solum, Nicolas Barren, MD as Physician Assistant (Internal Medicine) Luke Salaam, MD as Consulting Physician (Gastroenterology) Marc Senior, MD as Consulting Physician (Pulmonary Disease) Devorah Fonder, MD as Consulting Physician (Cardiology) Pa, [facility] (Ophthalmology)  Chief Complaint  Patient presents with  . Follow-up    No breathing problems.     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  He is on alpha-1 supplementation with Prolastin infusion weekly.  He was last seen here on 26 October 2023.  At that time he was having issues with a postviral cough syndrome.  HPI    Review of Systems A 10 point review of systems was performed and it is as noted above otherwise negative.   Patient Active Problem List   Diagnosis Date Noted  . AAT (alpha-1-antitrypsin) deficiency (HCC) 04/10/2019    Priority: High  . Cervical radiculopathy 01/12/2022  . History of colon polyps 12/24/2020  . Cirrhosis of liver (HCC) 04/04/2019  . Fatty liver 04/04/2019  . Obesity 07/05/2017  . Gallbladder polyp 01/20/2016  . Allergic rhinitis 01/20/2016  . Fatigue 01/20/2016  . Other long term (current) drug therapy 01/20/2016  . Aortic atherosclerosis (HCC) 11/01/2013  . History of shingles 03/06/2011  . Anxiety disorder 10/08/2009  . 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 Essential (primary) hypertension 12/05/2008  . GERD (gastroesophageal reflux disease) 12/05/2008  . Panic disorder 08/11/2007  . Old myocardial infarct 02/08/2007  . HLD  (hyperlipidemia) 03/24/2006  . Abnormal LFTs 08/10/2005  . CAD (coronary artery disease) 08/11/2003    Social History   Tobacco Use  . Smoking status: Former    Current packs/day: 0.00    Average packs/day: 1.5 packs/day for 20.0 years (30.0 ttl pk-yrs)    Types: Cigarettes    Start date: 08/10/1986    Quit date: 08/10/2006    Years since quitting: 17.3  . Smokeless tobacco: Never  Substance Use Topics  . Alcohol use: No    Allergies  Allergen Reactions  . Aleve  [Naproxen Sodium]   . Mobic  [Meloxicam]   . Voltaren [Diclofenac Sodium] Nausea And Vomiting    Current Meds  Medication Sig  . alpha-1-proteinase inhibitor, human, (PROLASTIN-C) 1000 MG/20ML SOLN Inject 60 mg/kg into the vein once a week.  Aaron Aas ascorbic acid (VITAMIN C) 500 MG tablet Take by mouth.  . clopidogrel  (PLAVIX ) 75 MG tablet Take 1 tablet (75 mg total) by mouth daily.  . Coenzyme Q10 (COQ10) 200 MG CAPS Take 1 capsule by mouth 2 (two) times daily.  . Continuous Blood Gluc Sensor (FREESTYLE LIBRE 14 DAY SENSOR) MISC   . Cyanocobalamin (VITAMIN B 12) 250 MCG LOZG Take by mouth.  . fluticasone (FLONASE) 50 MCG/ACT nasal spray Place into the nose.  . insulin  regular human CONCENTRATED (HUMULIN R ) 500 UNIT/ML injection Use up to 250 units (as calculated by U100 syringe) daily via insulin  pump  . Ipratropium-Albuterol  (COMBIVENT  RESPIMAT) 20-100 MCG/ACT AERS respimat Inhale 1 puff into the lungs every 6 (six) hours as needed for wheezing (cough,shortness of breath).  Aaron Aas ketoconazole (NIZORAL) 2 %  shampoo   . LORazepam  (ATIVAN ) 0.5 MG tablet 1-2 every 6 hours as neededTake by mouth. 1-2 every 6 hours as needed  . metFORMIN (GLUCOPHAGE) 500 MG tablet Take 500 mg by mouth 2 (two) times daily.   . Multiple Vitamin (MULTIVITAMIN) tablet Take 1 tablet by mouth daily.  . Omega-3 Fatty Acids (FISH OIL) 1000 MG CAPS Take 1 capsule by mouth 2 (two) times daily.   Aaron Aas OVER THE COUNTER MEDICATION Take 600 mg by mouth in the  morning and at bedtime. Beet Root  . pantoprazole  (PROTONIX ) 40 MG tablet TAKE ONE TABLET BY MOUTH ONE TIME DAILY  . rosuvastatin  (CRESTOR ) 40 MG tablet TAKE ONE TABLET BY MOUTH ONE TIME DAILY  . sertraline  (ZOLOFT ) 50 MG tablet Take 1 tablet (50 mg total) by mouth daily.  . tamsulosin  (FLOMAX ) 0.4 MG CAPS capsule Take 1 capsule (0.4 mg total) by mouth daily.  . telmisartan  (MICARDIS ) 40 MG tablet Take 1 tablet (40 mg total) by mouth daily.    Immunization History  Administered Date(s) Administered  . Fluzone Influenza virus vaccine,trivalent (IIV3), split virus 06/26/2015  . Hep A / Hep B 04/06/2019, 05/04/2019, 09/03/2022  . Influenza,inj,Quad PF,6+ Mos 04/20/2018, 04/10/2019  . Influenza-Unspecified 04/28/2017  . Moderna Sars-Covid-2 Vaccination 12/13/2019, 03/06/2020  . Pneumococcal Polysaccharide-23 07/23/2011  . Tdap 07/23/2011  . Zoster Recombinant(Shingrix ) 04/20/2018, 06/22/2018        Objective:     BP (!) 142/80 (BP Location: Left Arm, Patient Position: Sitting, Cuff Size: Normal)   Pulse 76   Temp 97.7 F (36.5 C) (Temporal)   Ht 5\' 10"  (1.778 m)   Wt 241 lb 9.6 oz (109.6 kg)   SpO2 97%   BMI 34.67 kg/m   SpO2: 97 %  GENERAL: Overweight gentleman, fully ambulatory, no distress, no conversational dyspnea.   HEAD: Normocephalic, atraumatic.  EYES: Pupils equal, round, reactive to light.  No scleral icterus.  MOUTH: Oral mucosa moist, no thrush.  Dentition intact.   NECK: Supple. No thyromegaly. No nodules. No JVD.  Trachea midline, phonation normal. PULMONARY: Lungs with coarse breath sounds throughout.  End expiratory wheezing noted. CARDIOVASCULAR: S1 and S2. Regular rate and rhythm.  Grade 1/6 to 2/6 systolic ejection murmur consistent with mitral regurgitation. GASTROINTESTINAL: Benign. MUSCULOSKELETAL: No joint swelling, no clubbing, no edema.  Point tenderness at the base of the neck.  There is significant muscle spasm noted between scapulae. NEUROLOGIC:  Awake alert, no focal deficits. SKIN: Intact,warm,dry.  No rashes noted on limited exam. PSYCH: Mood and behavior normal.        Assessment & Plan:     ICD-10-CM   1. AAT (alpha-1-antitrypsin) deficiency (HCC) - Phenotype SZ  E88.01 Pulmonary function test    2. Neck pain  M54.2 DG Cervical Spine 2 or 3 views    Pulmonary function test    3. Other cirrhosis of liver (HCC)  K74.69 Pulmonary function test    4. Shortness of breath  R06.02 Pulmonary function test      Orders Placed This Encounter  Procedures  . DG Cervical Spine 2 or 3 views    Standing Status:   Future    Expected Date:   12/14/2023    Expiration Date:   12/13/2024    Reason for Exam (SYMPTOM  OR DIAGNOSIS REQUIRED):   Neck pain, tingling of arms    Preferred imaging location?:   [facility]  . Pulmonary function test    Standing Status:   Future    Expected  Date:   01/14/2024    Expiration Date:   12/13/2024    Where should this test be performed?:   Outpatient Pulmonary    What type of PFT is being ordered?:   Full PFT    No orders of the defined types were placed in this encounter.      Advised if symptoms do not improve or worsen, to please contact office for sooner follow up or seek emergency care.    I spent xxx minutes of dedicated to the care of this patient on the date of this encounter to include pre-visit review of records, face-to-face time with the patient discussing conditions above, post visit ordering of testing, clinical documentation with the electronic health record, making appropriate referrals as documented, and communicating necessary findings to members of the patients care team.     C. Chloe Counter, MD Advanced Bronchoscopy PCCM [facility]    *This note was generated using voice recognition software/Dragon and/or AI transcription program.  Despite best efforts to proofread, errors can occur which can change the meaning. Any transcriptional errors that  result from this process are unintentional and may not be fully corrected at the time of dictation.

## 2023-12-15 ENCOUNTER — Encounter: Payer: Self-pay | Admitting: Pulmonary Disease

## 2023-12-15 ENCOUNTER — Ambulatory Visit
Admission: RE | Admit: 2023-12-15 | Discharge: 2023-12-15 | Disposition: A | Discharge status: Home / Self Care | Source: Ambulatory Visit | Attending: Family Medicine | Admitting: Family Medicine

## 2023-12-15 DIAGNOSIS — I700 Atherosclerosis of aorta: Secondary | ICD-10-CM | POA: Diagnosis not present

## 2023-12-15 DIAGNOSIS — R079 Chest pain, unspecified: Secondary | ICD-10-CM

## 2023-12-15 DIAGNOSIS — I2510 Atherosclerotic heart disease of native coronary artery without angina pectoris: Secondary | ICD-10-CM | POA: Diagnosis not present

## 2023-12-15 DIAGNOSIS — K7460 Unspecified cirrhosis of liver: Secondary | ICD-10-CM | POA: Diagnosis not present

## 2023-12-15 MED ORDER — IOPAMIDOL (ISOVUE-370) INJECTION 76%
75.0000 mL | Freq: Once | INTRAVENOUS | Status: AC | PRN
  Administered 2023-12-15: 75 mL via INTRAVENOUS

## 2023-12-16 ENCOUNTER — Encounter: Payer: Self-pay | Admitting: Family Medicine

## 2023-12-16 DIAGNOSIS — N261 Atrophy of kidney (terminal): Secondary | ICD-10-CM | POA: Insufficient documentation

## 2023-12-22 ENCOUNTER — Other Ambulatory Visit: Payer: Self-pay | Admitting: Cardiovascular Disease

## 2023-12-22 ENCOUNTER — Ambulatory Visit: Payer: Self-pay

## 2023-12-22 DIAGNOSIS — R772 Abnormality of alphafetoprotein: Secondary | ICD-10-CM | POA: Diagnosis not present

## 2023-12-22 NOTE — Telephone Encounter (Signed)
-----   Message from Luke Salaam sent at 12/20/2023 10:15 AM EDT ----- Eual Hermes shows no liver masses- suggest repeat AFT been more than 6 months

## 2023-12-22 NOTE — Telephone Encounter (Signed)
 Called Alex [name] back to let him know about his MRI results. I also let him know that Dr. Antony Baumgartner wanted him to have his AFP tumor marker drawn again and that I would be sending the order to [facility]. Alex [name] stated that he would try to go today and have them drawn.  Alex [name] also wanted me to ask Dr. Antony Baumgartner if he could review his other images done and if there is anything concerning or if there is anything to be concerned of. Please advise.

## 2023-12-23 LAB — AFP TUMOR MARKER: AFP, Serum, Tumor Marker: 25.7 ng/mL — ABNORMAL HIGH (ref 0.0–8.4)

## 2023-12-30 ENCOUNTER — Encounter: Payer: Self-pay | Admitting: Family Medicine

## 2024-01-04 MED ORDER — METHOCARBAMOL 500 MG PO TABS: 500.0000 mg | ORAL_TABLET | Freq: Four times a day (QID) | ORAL | 1 refills | Status: AC

## 2024-01-27 ENCOUNTER — Telehealth: Payer: Self-pay | Admitting: Pharmacist

## 2024-01-27 NOTE — Telephone Encounter (Signed)
 Submitted a Prior Authorization RENEWAL request to [employer] for PROLASTIN via CoverMyMeds. Will update once we receive a response.  Key: [id number]

## 2024-01-27 NOTE — Telephone Encounter (Signed)
 Received notification from [employer] regarding a prior authorization for PROLASTIN. Authorization has been APPROVED from 01/28/2024 to 01/26/2025.  Authorization # [id number]  Geraldene Kleine, PharmD, MPH, BCPS, CPP Clinical Pharmacist (Rheumatology and Pulmonology)

## 2024-02-02 DIAGNOSIS — E119 Type 2 diabetes mellitus without complications: Secondary | ICD-10-CM | POA: Diagnosis not present

## 2024-02-07 DIAGNOSIS — D2261 Melanocytic nevi of right upper limb, including shoulder: Secondary | ICD-10-CM | POA: Diagnosis not present

## 2024-02-07 DIAGNOSIS — D2271 Melanocytic nevi of right lower limb, including hip: Secondary | ICD-10-CM | POA: Diagnosis not present

## 2024-02-07 DIAGNOSIS — D2262 Melanocytic nevi of left upper limb, including shoulder: Secondary | ICD-10-CM | POA: Diagnosis not present

## 2024-02-07 DIAGNOSIS — D2272 Melanocytic nevi of left lower limb, including hip: Secondary | ICD-10-CM | POA: Diagnosis not present

## 2024-02-21 ENCOUNTER — Other Ambulatory Visit: Payer: Self-pay | Admitting: Cardiovascular Disease

## 2024-02-24 ENCOUNTER — Telehealth: Payer: Self-pay

## 2024-02-24 NOTE — Telephone Encounter (Signed)
 Copied from CRM [id number]. Topic: Clinical - Prescription Issue >> Feb 24, 2024  1:16 PM Russell PARAS wrote: Reason for CRM:   Pt's wife contacted clinic regarding his Prolastin-C prescription. He receives infusions, and wife was contacted by text from the infusion nurse, stating that she needed new orders. Nurse provided FX [phone], but no further details.  Wife is requesting call back   CB#  [phone]

## 2024-02-28 NOTE — Telephone Encounter (Signed)
 Called [facility] for Prolastin order renewal. Per pharmacist, they do not need orders for Prolastin. They need nursing order possibly. I called Alex [name] , the home health nurse. She states that they need updated nursing orders. I advised that we sent in a year's worth of orders back in Dec 2024 to [facility] but they may not have forwarded those to home health/nursing team. I have faxed our orders to her directly per her request  Nursing phone: [phone] Fax: [phone]   Sherry Pennant, PharmD, MPH, BCPS, CPP Clinical Pharmacist (Rheumatology and Pulmonology)

## 2024-03-07 ENCOUNTER — Telehealth: Payer: Self-pay | Admitting: Pharmacist

## 2024-03-07 ENCOUNTER — Encounter: Payer: Self-pay | Admitting: Pulmonary Disease

## 2024-03-07 NOTE — Telephone Encounter (Signed)
 Spoke with [place], [facility] for Prolastin for double dose of 120mg /kg every other week if needed for travel.  Pharmacist confirms they have orders which nurse has access to. MyChart message returned to patient  Sherry Pennant, PharmD, MPH, BCPS, CPP Clinical Pharmacist (Rheumatology and Pulmonology)

## 2024-03-14 DIAGNOSIS — E1159 Type 2 diabetes mellitus with other circulatory complications: Secondary | ICD-10-CM | POA: Diagnosis not present

## 2024-03-14 DIAGNOSIS — Z9641 Presence of insulin pump (external) (internal): Secondary | ICD-10-CM | POA: Diagnosis not present

## 2024-03-14 DIAGNOSIS — E11649 Type 2 diabetes mellitus with hypoglycemia without coma: Secondary | ICD-10-CM | POA: Diagnosis not present

## 2024-03-14 DIAGNOSIS — Z794 Long term (current) use of insulin: Secondary | ICD-10-CM | POA: Diagnosis not present

## 2024-03-14 DIAGNOSIS — E1129 Type 2 diabetes mellitus with other diabetic kidney complication: Secondary | ICD-10-CM | POA: Diagnosis not present

## 2024-03-14 DIAGNOSIS — R809 Proteinuria, unspecified: Secondary | ICD-10-CM | POA: Diagnosis not present

## 2024-03-14 DIAGNOSIS — Z1331 Encounter for screening for depression: Secondary | ICD-10-CM | POA: Diagnosis not present

## 2024-03-15 ENCOUNTER — Ambulatory Visit: Payer: Self-pay | Admitting: Pulmonary Disease

## 2024-03-15 ENCOUNTER — Ambulatory Visit: Admitting: Pulmonary Disease

## 2024-03-15 ENCOUNTER — Encounter: Payer: Self-pay | Admitting: Pulmonary Disease

## 2024-03-15 VITALS — BP 140/90 | HR 76 | Temp 98.2°F | Ht 70.0 in | Wt 236.6 lb

## 2024-03-15 DIAGNOSIS — M542 Cervicalgia: Secondary | ICD-10-CM

## 2024-03-15 DIAGNOSIS — E8801 Alpha-1-antitrypsin deficiency: Secondary | ICD-10-CM | POA: Diagnosis not present

## 2024-03-15 DIAGNOSIS — K7469 Other cirrhosis of liver: Secondary | ICD-10-CM

## 2024-03-15 DIAGNOSIS — J4489 Other specified chronic obstructive pulmonary disease: Secondary | ICD-10-CM | POA: Diagnosis not present

## 2024-03-15 DIAGNOSIS — R0602 Shortness of breath: Secondary | ICD-10-CM

## 2024-03-15 LAB — PULMONARY FUNCTION TEST
DL/VA % pred: 119 %
DL/VA: 5.11 ml/min/mmHg/L
DLCO unc % pred: 95 %
DLCO unc: 26.8 ml/min/mmHg
FEF 25-75 Post: 2.63 L/s
FEF 25-75 Pre: 2.25 L/s
FEF2575-%Change-Post: 16 %
FEF2575-%Pred-Post: 82 %
FEF2575-%Pred-Pre: 71 %
FEV1-%Change-Post: 2 %
FEV1-%Pred-Post: 70 %
FEV1-%Pred-Pre: 68 %
FEV1-Post: 2.63 L
FEV1-Pre: 2.57 L
FEV1FVC-%Change-Post: 1 %
FEV1FVC-%Pred-Pre: 103 %
FEV6-%Change-Post: 0 %
FEV6-%Pred-Post: 68 %
FEV6-%Pred-Pre: 69 %
FEV6-Post: 3.22 L
FEV6-Pre: 3.24 L
FEV6FVC-%Pred-Post: 104 %
FEV6FVC-%Pred-Pre: 104 %
FVC-%Change-Post: 0 %
FVC-%Pred-Post: 66 %
FVC-%Pred-Pre: 66 %
FVC-Post: 3.25 L
FVC-Pre: 3.24 L
Post FEV1/FVC ratio: 81 %
Post FEV6/FVC ratio: 100 %
Pre FEV1/FVC ratio: 79 %
Pre FEV6/FVC Ratio: 100 %
RV % pred: 102 %
RV: 2.22 L
TLC % pred: 80 %
TLC: 5.63 L

## 2024-03-15 NOTE — Patient Instructions (Signed)
 VISIT SUMMARY:  You came in today for a follow-up on your alpha-1 antitrypsin deficiency and chronic obstructive pulmonary disease (COPD). You reported no recent cough or shortness of breath and mentioned feeling generally well, though you experience fatigue in the evenings. Your prolapsin infusions are ongoing, and the recent issue with obtaining a double infusion order was resolved.  YOUR PLAN:  -CHRONIC OBSTRUCTIVE PULMONARY DISEASE (COPD) SECONDARY TO ALPHA-1 ANTITRYPSIN DEFICIENCY: COPD is a lung condition that makes it hard to breathe and is caused by damage to the lungs. Your COPD, which is due to alpha-1 antitrypsin deficiency, is well-managed with no recent flare-ups. You are not using inhalers regularly but have an emergency albuterol  inhaler available if needed. Continue to monitor your symptoms and ensure you have your emergency inhaler on hand.  -ALPHA-1 ANTITRYPSIN DEFICIENCY: Alpha-1 antitrypsin deficiency is a genetic condition that can lead to lung and liver disease. Your condition is well-managed with prolastin infusions, and the recent issue with obtaining orders for these infusions has been resolved. Continue with your scheduled prolastin infusions.  INSTRUCTIONS:  Please return for a follow-up appointment in six months.

## 2024-03-15 NOTE — Progress Notes (Signed)
 Full PFT completed today ? ?

## 2024-03-15 NOTE — Progress Notes (Signed)
 Subjective:    Patient ID: Alex [name], male    DOB: 13-Dec-1965, 58 y.o.   MRN: [id number]  Patient Care Team: Gasper Nancyann BRAVO, MD as PCP - General (Family Medicine) Perla Evalene PARAS, MD as PCP - Cardiology (Cardiology) Solum, Therisa CHRISTELLA, MD as Physician Assistant (Internal Medicine) Therisa Bi, MD as Consulting Physician (Gastroenterology) Tamea Dedra CROME, MD as Consulting Physician (Pulmonary Disease) Perla Evalene PARAS, MD as Consulting Physician (Cardiology) Pa, [facility] (Ophthalmology)  Chief Complaint  Patient presents with   Follow-up    BACKGROUND/INTERVAL:Patient is a 58 year old former smoker (30 PY) who presents for follow-up on the issue of heterozygous alpha 1 antitrypsin deficiency phenotype SZ.  He is on alpha-1 supplementation with Prolastin infusion weekly.  He was last seen here on 14 Dec 2023.    HPI Discussed the use of AI scribe software for clinical note transcription with the patient, who gave verbal consent to proceed.  History of Present Illness   Alex [name] is a 58 year old male with alpha-1 antitrypsin deficiency and COPD who presents for follow-up.  He follows up for his known conditions of alpha-1 antitrypsin deficiency and chronic obstructive pulmonary disease (COPD). He continues to receive Prolastin infusions, although there was a recent issue with obtaining an order for a double infusion while preparing for travel, which was resolved by the pharmacist.  No cough or shortness of breath recently, and he states that he has been 'really good'. He is not currently using inhalers but has an emergency albuterol  inhaler available if needed.  He mentions feeling tired frequently but denies any issues with sleep, such as snoring or nocturnal awakenings.  Wife does not report apneic episodes.  He feels refreshed upon waking but notes a lack of stamina, particularly in the evenings.      DIAGNOSTIC DATA: 01/06/2019 CT chest high resolution:  Patchy confluent centrilobular groundglass micronodularity with upper lung predominance.  Mild to moderate patchy air trapping. 02/09/2019 alpha-1 phenotype SZ, alpha-1 level 81 mg/dL 91/89/7979 hepatitis A, B and C titers negative, HIV negative.  IgA 497 mL/dL (normal) 91/89/7979 alpha-1 level verified 75 mg/dL 91/74/7979 PFTs: FEV1 7.43 L or 64% predicted, FEV1/FVC 79%, FVC 3.23 L or 63% predicted, DLCO 69%. 04/06/2019 hepatitis A and B immunization given (first round) 04/06/2019 alpha-1 level 74 mg/dL, phenotype verified SZ 04/10/2019 influenza vaccine given 05/04/2019 hepatitis B immunization concluded 08/23/2019 IgE level, hypersensitivity pneumonitis panel and CBC all normal 06/26/2022 PFTs: FEV1 2.40 L or 62% predicted, FVC 3.05 L or 60% predicted, FEV1/FVC 79%, lung volumes normal diffusion capacity mildly reduced but corrects for alveolar volume consistent with mild obstructive lung disease.  No significant change from prior. 10/26/2023 CXR PA and lateral: No active cardiopulmonary disease. 12/08/2023 CXR PA and lateral: No active cardiopulmonary disease 03/15/2024 PFTs: FEV1 2.57 L or 68% predicted, FVC 3.24 L or 66% predicted, FEV1/FVC 79%, no bronchodilator response.  Lung volumes normal diffusion capacity normal.  Consistent with stable lung function.    Review of Systems A 10 point review of systems was performed and it is as noted above otherwise negative.   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   Type 2 diabetes mellitus with diabetic chronic kidney disease (HCC) 12/05/2008   Essential (primary) hypertension 12/05/2008   GERD (gastroesophageal reflux disease) 12/05/2008   Panic disorder 08/11/2007   Old myocardial infarct 02/08/2007   HLD (hyperlipidemia) 03/24/2006   Abnormal LFTs 08/10/2005   CAD (coronary artery disease) 08/11/2003    Social History   Tobacco Use   Smoking status: Former    Current packs/day: 0.00    Average packs/day: 1.5 packs/day for 20.0 years (30.0 ttl pk-yrs)    Types: Cigarettes    Start date: 08/10/1986    Quit date: 08/10/2006    Years since quitting: 17.6   Smokeless tobacco: Never  Substance Use Topics   Alcohol use: No    Allergies  Allergen Reactions   Aleve  [Naproxen Sodium]    Mobic  [Meloxicam]    Voltaren [Diclofenac Sodium] Nausea And Vomiting    Current Meds  Medication Sig   alpha-1-proteinase inhibitor, human, (PROLASTIN-C) 1000 MG/20ML SOLN Inject 60 mg/kg into the vein once a week.   ascorbic acid (VITAMIN C) 500 MG tablet Take by mouth.   clopidogrel  (PLAVIX ) 75 MG tablet TAKE ONE TABLET BY MOUTH ONE TIME DAILY   Coenzyme Q10 (COQ10) 200 MG CAPS Take 1 capsule by mouth 2 (two) times daily.   Continuous Blood Gluc Sensor (FREESTYLE LIBRE 14 DAY SENSOR) MISC    Cyanocobalamin (VITAMIN B 12) 250 MCG LOZG Take by mouth.   fluticasone (FLONASE) 50 MCG/ACT nasal spray Place into the nose.   insulin  regular human CONCENTRATED (HUMULIN R ) 500 UNIT/ML injection Use up to 250 units (as calculated by U100 syringe) daily via insulin  pump   ketoconazole (NIZORAL) 2 % shampoo    LORazepam  (ATIVAN ) 0.5 MG tablet 1-2 every 6 hours as neededTake by mouth. 1-2 every 6 hours as needed   metFORMIN (GLUCOPHAGE) 500 MG tablet Take 500 mg by mouth 2 (two) times daily.    methocarbamol  (ROBAXIN ) 500 MG tablet Take 1-2 tablets (500-1,000 mg total) by mouth 4  (four) times daily.   Multiple Vitamin (MULTIVITAMIN) tablet Take 1 tablet by mouth daily.   Omega-3 Fatty Acids (FISH OIL) 1000 MG CAPS Take 1 capsule by mouth 2 (two) times daily.    OVER THE COUNTER MEDICATION Take 600 mg by mouth in the morning and at bedtime. Beet Root   pantoprazole  (PROTONIX ) 40 MG tablet TAKE ONE TABLET BY MOUTH ONE TIME DAILY   rosuvastatin  (CRESTOR ) 40 MG tablet TAKE ONE TABLET BY MOUTH ONE TIME DAILY   sertraline  (ZOLOFT ) 50 MG tablet Take 1 tablet (50 mg total) by mouth daily.   tamsulosin  (FLOMAX ) 0.4 MG CAPS capsule Take 1 capsule (0.4 mg total) by mouth daily.   telmisartan  (MICARDIS ) 40 MG tablet TAKE ONE TABLET BY MOUTH ONE TIME DAILY    Immunization History  Administered Date(s) Administered   Fluzone Influenza virus vaccine,trivalent (IIV3), split virus 06/26/2015   Hep A / Hep B 04/06/2019, 05/04/2019, 09/03/2022   Influenza,inj,Quad PF,6+ Mos 04/28/2017, 04/20/2018, 04/10/2019   Influenza-Unspecified 04/28/2017   Moderna Sars-Covid-2 Vaccination 12/13/2019, 03/06/2020   Pneumococcal Polysaccharide-23 07/23/2011   Tdap 07/23/2011   Zoster Recombinant(Shingrix ) 04/20/2018, 06/22/2018        Objective:     BP (!) 140/90 (BP  Location: Left Arm, Patient Position: Sitting, Cuff Size: Normal)   Pulse 76   Temp 98.2 F (36.8 C) (Oral)   Ht 5' 10 (1.778 m)   Wt 236 lb 9.6 oz (107.3 kg)   SpO2 97%   BMI 33.95 kg/m   SpO2: 97 %  GENERAL: Overweight gentleman, fully ambulatory, no distress, no conversational dyspnea.   HEAD: Normocephalic, atraumatic.  EYES: Pupils equal, round, reactive to light.  No scleral icterus.  MOUTH: Oral mucosa moist, no thrush.  Dentition intact.   NECK: Supple. No thyromegaly. No nodules. No JVD.  Trachea midline, phonation normal. PULMONARY: Good air entry bilaterally.  No adventitious sounds. CARDIOVASCULAR: S1 and S2. Regular rate and rhythm.  Grade 1/6 to 2/6 systolic ejection murmur consistent with mitral  regurgitation. GASTROINTESTINAL: Benign. MUSCULOSKELETAL: No joint swelling, no clubbing, no edema.  Point tenderness at the base of the neck.  There is significant muscle spasm noted between scapulae. NEUROLOGIC: Awake alert, no focal deficits. SKIN: Intact,warm,dry.  No rashes noted on limited exam. PSYCH: Mood and behavior normal.   Assessment & Plan:     ICD-10-CM   1. AAT (alpha-1-antitrypsin) deficiency (HCC) - Phenotype SZ  E88.01     2. Asthma-COPD overlap syndrome (HCC)  J44.89     3. Other cirrhosis of liver (HCC)  K74.69      Discussion:    Chronic obstructive pulmonary disease (COPD) secondary to alpha-1 antitrypsin deficiency COPD secondary to alpha-1 antitrypsin deficiency is well-managed with slight improvement to stability in lung function. No recent exacerbations, cough, or dyspnea. He is not using inhalers regularly but has an emergency albuterol  inhaler available. No signs of sleep apnea, and he reports feeling refreshed upon waking, though he experiences fatigue by evening. - Ensure availability of emergency albuterol  inhaler - Return for follow-up in six months  Alpha-1 antitrypsin deficiency Alpha-1 antitrypsin deficiency is well-managed with Prolastin infusions, which have been effective. Previous issue with obtaining orders for infusions has been resolved by the pharmacist. - Continue Prolastin infusions as scheduled      Advised if symptoms do not improve or worsen, to please contact office for sooner follow up or seek emergency care.    I spent 30 minutes of dedicated to the care of this patient on the date of this encounter to include pre-visit review of records, face-to-face time with the patient discussing conditions above, post visit ordering of testing, clinical documentation with the electronic health record, making appropriate referrals as documented, and communicating necessary findings to members of the patients care team.     C. Leita Sanders,  MD Advanced Bronchoscopy PCCM [facility]    *This note was generated using voice recognition software/Dragon and/or AI transcription program.  Despite best efforts to proofread, errors can occur which can change the meaning. Any transcriptional errors that result from this process are unintentional and may not be fully corrected at the time of dictation.

## 2024-03-15 NOTE — Patient Instructions (Signed)
 Full PFT completed today ? ?

## 2024-04-04 ENCOUNTER — Encounter: Payer: Self-pay | Admitting: Cardiology

## 2024-04-04 ENCOUNTER — Ambulatory Visit: Attending: Cardiology | Admitting: Cardiology

## 2024-04-04 VITALS — BP 140/82 | HR 68 | Ht 70.0 in | Wt 236.6 lb

## 2024-04-04 DIAGNOSIS — I10 Essential (primary) hypertension: Secondary | ICD-10-CM

## 2024-04-04 DIAGNOSIS — E8801 Alpha-1-antitrypsin deficiency: Secondary | ICD-10-CM | POA: Diagnosis not present

## 2024-04-04 DIAGNOSIS — E1122 Type 2 diabetes mellitus with diabetic chronic kidney disease: Secondary | ICD-10-CM

## 2024-04-04 DIAGNOSIS — I25118 Atherosclerotic heart disease of native coronary artery with other forms of angina pectoris: Secondary | ICD-10-CM

## 2024-04-04 DIAGNOSIS — N182 Chronic kidney disease, stage 2 (mild): Secondary | ICD-10-CM

## 2024-04-04 DIAGNOSIS — E782 Mixed hyperlipidemia: Secondary | ICD-10-CM

## 2024-04-04 DIAGNOSIS — Z794 Long term (current) use of insulin: Secondary | ICD-10-CM

## 2024-04-04 NOTE — Progress Notes (Signed)
 Cardiology Office Note   Date:  04/04/2024  ID:  Alex [name], Alex [name] July 07, 1966, MRN [id number] PCP: Gasper Nancyann BRAVO, MD  [facility] Providers Cardiologist:  Evalene Lunger, MD     History of Present Illness Alex [name] is a 58 y.o. male with a past medical history of coronary artery disease, mixed hyperlipidemia, essential hypertension, alcoholic cirrhosis without ascites, alpha 1 antitrypsin deficiency, asthma/COPD, former smoker (quit 2008), peripheral arterial disease, type 2 diabetes on insulin  therapy, carotid stenosis, who is here today for follow-up.   Coronary artery disease with previous left heart catheterization with PCI/DES to the LAD in 03/2007.  Echocardiogram completed 10/2018 revealed LVEF 55%, mild mitral valve insufficiency and trace tricuspid insufficiency.  He underwent Lexiscan Myoview 10/2018 and showed normal treadmill EKG without evidence of ischemia or arrhythmia and normal myocardial perfusion without evidence of myocardial ischemia.   He was seen in clinic/30/2024 by Dr. Gollan.  At that time he was establishing care.  Continued history of coronary artery disease.  He was doing well with no anginal or anginal equivalent symptoms.  There was no further testing that was ordered and medication changes that were made.  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   He was last seen in clinic 04/30/2023 stating he was doing well from the cardiac perspective.  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  Scheduled for exercise tolerance testing.  He returns to clinic today stating that he has done well from a cardiac perspective.  Denies any chest pain, shortness of breath, peripheral edema, lightheadedness or dizziness.  States that he has been compliant with his current medication regimen with any undue side effects.  Denies any hospitalizations or visits to the emergency  department.  ROS: 10 point review of system's were reviewed and considered negative except ones been listed in the HPI  Studies Reviewed EKG Interpretation Date/Time:  Tuesday April 04 2024 11:12:00 EDT Ventricular Rate:  68 PR Interval:  148 QRS Duration:  94 QT Interval:  386 QTC Calculation: 410 R Axis:   -11  Text Interpretation: Normal sinus rhythm Septal infarct , age undetermined unchanged from previous studies When compared with ECG of 30-Apr-2023 08:14, No significant change was found Confirmed by Gerard Frederick ([id number]) on 04/04/2024 11:16:35 AM   Exercise tolerance test 05/17/2023   Patient exercised for 8 min and 0 sec. Maximum HR of 169 bpm. MPHR 102.0%. Peak METS 10.1. The patient experienced no angina during the test.   No ST deviation was noted. Arrhythmias during stress: frequent PVCs.   Frequent PVCs during stress and in recovery.  The PVCs has a right bundle branch block morphology with inferior axis   Lexiscan MPI 10/2018 (outside facility) Normal treadmill EKG without evidence of ischemia or arrhythmia  Normal myocardial perfusion without evidence of myocardial ischemia    TTE 10/19/2018 (outside facility) INTERPRETATION  NORMAL LEFT VENTRICULAR SYSTOLIC FUNCTION WITH AN ESTIMATED EF = 55 %  NORMAL RIGHT VENTRICULAR SYSTOLIC FUNCTION  MILD MITRAL VALVE INSUFFICIENCY  TRACE TRICUSPID VALVE INSUFFICIENCY  NO VALVULAR STENOSIS  Risk Assessment/Calculations   HYPERTENSION CONTROL Vitals:   04/04/24 1105 04/04/24 1128  BP: (!) 148/80 (!) 140/82    The patient's blood pressure is elevated above target today.  In order to address the patient's elevated BP: The blood pressure is usually elevated in clinic.  Blood pressures monitored at home have been  optimal.          Physical Exam VS:  BP (!) 140/82 (BP Location: Left Arm, Patient Position: Sitting, Cuff Size: Normal)   Pulse 68   Ht 5' 10 (1.778 m)   Wt 236 lb 9.6 oz (107.3 kg)   SpO2 98%   BMI 33.95  kg/m        Wt Readings from Last 3 Encounters:  04/04/24 236 lb 9.6 oz (107.3 kg)  03/15/24 236 lb 9.6 oz (107.3 kg)  03/15/24 239 lb 9.6 oz (108.7 kg)    GEN: Well nourished, well developed in no acute distress NECK: No JVD; No carotid bruits CARDIAC: RRR, no murmurs, rubs, gallops RESPIRATORY:  Clear to auscultation without rales, wheezing or rhonchi  ABDOMEN: Soft, non-tender, non-distended EXTREMITIES:  No edema; No deformity   ASSESSMENT AND PLAN Coronary artery disease of native artery without angina with prior stenting to the LAD in 2008.  EKG today reveals sinus rhythm with an old septal infarct with no acute changes noted.  He has continued on clopidogrel  75 mg daily and rosuvastatin  40 mg daily with no further ischemic workup needed at this time.  Primary hypertension with blood pressure 148/80 and 140/82.  Blood pressure at previous appointment was 112.  Patient states that he continues to suffer from whitecoat hypertension.  He has been continuing telmisartan  40 mg daily.  He has been encouraged to continue to monitor his pressure 1 to 2 hours postmedication administration at home as well.  Mixed hyperlipidemia with last LDL of 106.  Jumped from 103 previously.  He stated that when he had his last labs done it was nonfasting labs.  Repeat lipid panel ordered.  He is currently being continued on rosuvastatin  40 mg daily and CoQ 10 daily.  Encouraged to continue with lifestyle and dietary modification.  Alpha-1 antitrypsin where he continues to be followed by pulmonary.  Type 2 diabetes he is continued on insulin  and metformin.  Continues to be followed by endocrinology with improving A1c.       Dispo: Patient to return to clinic to see MD/APP in 11 to 12 months or sooner if needed for further evaluation.  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  According to DOT regulations that he will need testing next year.  Signed, Shaneese Tait, NP

## 2024-04-04 NOTE — Patient Instructions (Addendum)
 Medication Instructions:  Your physician recommends that you continue on your current medications as directed. Please refer to the Current Medication list given to you today.  *If you need a refill on your cardiac medications before your next appointment, please call your pharmacy*  Lab Work: Your provider would like for you  to have the following labs drawn: Lipid panel.   Please go to [facility] [address] ([facility] [address], [place] [address] You do not need an appointment.  They are open from 8 am- 4:30 pm.  Lunch from 1:00 pm- 2:00 pm   You may also go to one of the following [facility]:  [address] [place], [place] [address] Phone: [phone] Lab hours: Mon-Fri 8 am- 5 pm    Lunch 12 pm- 1 pm  [address] [place],  [place]  [address]  [place]  Phone: [phone] Lab hours: 7 am- 4 pm Lunch 12 pm-1 pm   [address] [place],  [place]  [address]  [place]  Phone: [phone] Lab hours: Mon-Fri 8 am- 5 pm    Lunch 12 pm- 1 pm   Testing/Procedures: No test ordered today   Follow-Up: At [facility], you and your health needs are our priority.  As part of our continuing mission to provide you with exceptional heart care, our providers are all part of one team.  This team includes your primary Cardiologist (physician) and Advanced Practice Providers or APPs (Physician Assistants and Nurse Practitioners) who all work together to provide you with the care you need, when you need it.  Your next appointment:   1 year(s)  Provider:   You will see one of the following Advanced Practice Providers on your designated Care Team:   Lonni Meager, NP Lesley Maffucci, PA-C Bernardino Bring, PA-C Cadence Chualar, PA-C Tylene Lunch, NP Barnie Hila, NP

## 2024-04-27 ENCOUNTER — Other Ambulatory Visit: Payer: Self-pay | Admitting: Family Medicine

## 2024-04-27 DIAGNOSIS — F410 Panic disorder [episodic paroxysmal anxiety] without agoraphobia: Secondary | ICD-10-CM

## 2024-05-08 DIAGNOSIS — E1159 Type 2 diabetes mellitus with other circulatory complications: Secondary | ICD-10-CM | POA: Diagnosis not present

## 2024-05-08 DIAGNOSIS — Z794 Long term (current) use of insulin: Secondary | ICD-10-CM | POA: Diagnosis not present

## 2024-05-08 DIAGNOSIS — E119 Type 2 diabetes mellitus without complications: Secondary | ICD-10-CM | POA: Diagnosis not present

## 2024-05-11 ENCOUNTER — Other Ambulatory Visit: Payer: Self-pay | Admitting: Cardiovascular Disease

## 2024-05-17 ENCOUNTER — Encounter: Payer: Self-pay | Admitting: Pulmonary Disease

## 2024-06-09 ENCOUNTER — Other Ambulatory Visit: Payer: Self-pay | Admitting: Cardiovascular Disease

## 2024-06-09 DIAGNOSIS — I2510 Atherosclerotic heart disease of native coronary artery without angina pectoris: Secondary | ICD-10-CM

## 2024-06-20 DIAGNOSIS — Z794 Long term (current) use of insulin: Secondary | ICD-10-CM | POA: Diagnosis not present

## 2024-06-20 DIAGNOSIS — R809 Proteinuria, unspecified: Secondary | ICD-10-CM | POA: Diagnosis not present

## 2024-06-20 DIAGNOSIS — E1129 Type 2 diabetes mellitus with other diabetic kidney complication: Secondary | ICD-10-CM | POA: Diagnosis not present

## 2024-06-21 NOTE — Telephone Encounter (Signed)
 error

## 2024-06-29 DIAGNOSIS — Z91018 Allergy to other foods: Secondary | ICD-10-CM | POA: Diagnosis not present

## 2024-06-29 DIAGNOSIS — K7460 Unspecified cirrhosis of liver: Secondary | ICD-10-CM | POA: Diagnosis not present

## 2024-07-03 ENCOUNTER — Other Ambulatory Visit: Payer: Self-pay | Admitting: Gastroenterology

## 2024-07-03 ENCOUNTER — Telehealth (HOSPITAL_BASED_OUTPATIENT_CLINIC_OR_DEPARTMENT_OTHER): Payer: Self-pay

## 2024-07-03 DIAGNOSIS — K7460 Unspecified cirrhosis of liver: Secondary | ICD-10-CM

## 2024-07-03 NOTE — Telephone Encounter (Signed)
   Pre-operative Risk Assessment    Patient Name: Alex [name]  DOB: 23-Jul-1966 MRN: [id number]   Date of last office visit: 04/04/24 with [place] Date of next office visit: NA  Request for Surgical Clearance    Procedure:  Colonoscopy and Endoscopy   Date of Surgery:  Clearance TBD                                 Surgeon:  Dr. Therisa Socks Group or Practice Name:  [facility] Phone number:  [phone] Fax number:  [phone]   Type of Clearance Requested:   - Medical  - Pharmacy:  Hold Clopidogrel  (Plavix ) for 5 days   Type of Anesthesia:  propofol     Additional requests/questions:    Bonney Augustin JONETTA Delores   07/03/2024, 9:26 AM

## 2024-07-03 NOTE — Telephone Encounter (Signed)
   Name: Alex [name]  DOB: [birth year]  MRN: [id number]  Primary Cardiologist: Evalene Lunger, MD   Preoperative team, please contact this patient and set up a phone call appointment for further preoperative risk assessment. Please obtain consent and complete medication review. Thank you for your help.  I confirm that guidance regarding antiplatelet and oral anticoagulation therapy has been completed and, if necessary, noted below.  He may hold Plavix  for 5 days prior to procedure. Please resume Plavix  as soon as possible postprocedure, at the discretion of the surgeon.    I also confirmed the patient resides in the state of [place] . As per [facility] Medical Board telemedicine laws, the patient must reside in the state in which the provider is licensed.   Lum LITTIE Louis, NP 07/03/2024, 9:39 AM [facility]

## 2024-07-03 NOTE — Telephone Encounter (Signed)
Left message for the pt to call back and schedule tele pre op appt.

## 2024-07-05 NOTE — Telephone Encounter (Signed)
 S/w the pt and he said he is trying to put a vacation time together and a few other things before he set the procedure date. In our conversation I asked the pt would it be easier for him to call us  when he is ready to schedule the tele preop appt. Pt answered yes please. Pt asked if I could put a reminder in [facility] CHART for him I said I sure will.   Will removed from the preop call back pool until the pt calls back.  I will send FYI to requesting office as well.

## 2024-07-08 ENCOUNTER — Ambulatory Visit
Admission: RE | Admit: 2024-07-08 | Discharge: 2024-07-08 | Disposition: A | Discharge status: Home / Self Care | Source: Ambulatory Visit | Attending: Gastroenterology | Admitting: Gastroenterology

## 2024-07-08 DIAGNOSIS — K7460 Unspecified cirrhosis of liver: Secondary | ICD-10-CM | POA: Diagnosis not present

## 2024-07-08 DIAGNOSIS — K8020 Calculus of gallbladder without cholecystitis without obstruction: Secondary | ICD-10-CM | POA: Diagnosis not present

## 2024-07-08 DIAGNOSIS — N2889 Other specified disorders of kidney and ureter: Secondary | ICD-10-CM | POA: Diagnosis not present

## 2024-07-08 DIAGNOSIS — R161 Splenomegaly, not elsewhere classified: Secondary | ICD-10-CM | POA: Diagnosis not present

## 2024-07-08 MED ORDER — GADOBUTROL 1 MMOL/ML IV SOLN
10.0000 mL | Freq: Once | INTRAVENOUS | Status: AC | PRN
  Administered 2024-07-08: 10 mL via INTRAVENOUS

## 2024-07-18 ENCOUNTER — Other Ambulatory Visit: Payer: Self-pay | Admitting: Family Medicine

## 2024-07-18 DIAGNOSIS — K219 Gastro-esophageal reflux disease without esophagitis: Secondary | ICD-10-CM

## 2024-08-13 ENCOUNTER — Encounter: Payer: Self-pay | Admitting: Gastroenterology

## 2024-08-17 ENCOUNTER — Encounter: Payer: Self-pay | Admitting: Cardiovascular Disease

## 2024-08-18 ENCOUNTER — Telehealth (HOSPITAL_BASED_OUTPATIENT_CLINIC_OR_DEPARTMENT_OTHER): Payer: Self-pay

## 2024-08-18 ENCOUNTER — Telehealth (HOSPITAL_BASED_OUTPATIENT_CLINIC_OR_DEPARTMENT_OTHER): Payer: Self-pay | Admitting: *Deleted

## 2024-08-18 NOTE — Telephone Encounter (Signed)
" ° °  Name: Alex [name]  DOB: 07-07-1966  MRN: [id number]  Primary Cardiologist: Evalene Lunger, MD   Preoperative team, please contact this patient and set up a phone call appointment for further preoperative risk assessment. Please obtain consent and complete medication review. Thank you for your help.  I confirm that guidance regarding antiplatelet and oral anticoagulation therapy has been completed and, if necessary, noted below.  Patient previously cleared to hold Plavix .  Patient may hold Plavix  for 5 days prior to procedure.  Please resume Plavix  as soon as possible postprocedure, the discretion of the surgeon.  I also confirmed the patient resides in the state of [place] . As per [facility] telemedicine laws, the patient must reside in the state in which the provider is licensed.   Damien JAYSON Braver, NP 08/18/2024, 9:09 AM [facility]    "

## 2024-08-18 NOTE — Telephone Encounter (Signed)
 Pt has been scheduled tele preop appt. Med rec and consent are done. Med rec and consent are done.      Patient Consent for Virtual Visit        [name] has provided verbal consent on 08/18/2024 for a virtual visit (video or telephone).   CONSENT FOR VIRTUAL VISIT FOR:  Alex [name]  By participating in this virtual visit I agree to the following:  I hereby voluntarily request, consent and authorize [facility] and its employed or contracted physicians, physician assistants, nurse practitioners or other licensed health care professionals (the Practitioner), to provide me with telemedicine health care services (the Services) as deemed necessary by the treating Practitioner. I acknowledge and consent to receive the Services by the Practitioner via telemedicine. I understand that the telemedicine visit will involve communicating with the Practitioner through live audiovisual communication technology and the disclosure of certain medical information by electronic transmission. I acknowledge that I have been given the opportunity to request an in-person assessment or other available alternative prior to the telemedicine visit and am voluntarily participating in the telemedicine visit.  I understand that I have the right to withhold or withdraw my consent to the use of telemedicine in the course of my care at any time, without affecting my right to future care or treatment, and that the Practitioner or I may terminate the telemedicine visit at any time. I understand that I have the right to inspect all information obtained and/or recorded in the course of the telemedicine visit and may receive copies of available information for a reasonable fee.  I understand that some of the potential risks of receiving the Services via telemedicine include:  Delay or interruption in medical evaluation due to technological equipment failure or disruption; Information transmitted may not be sufficient  (e.g. poor resolution of images) to allow for appropriate medical decision making by the Practitioner; and/or  In rare instances, security protocols could fail, causing a breach of personal health information.  Furthermore, I acknowledge that it is my responsibility to provide information about my medical history, conditions and care that is complete and accurate to the best of my ability. I acknowledge that Practitioner's advice, recommendations, and/or decision may be based on factors not within their control, such as incomplete or inaccurate data provided by me or distortions of diagnostic images or specimens that may result from electronic transmissions. I understand that the practice of medicine is not an exact science and that Practitioner makes no warranties or guarantees regarding treatment outcomes. I acknowledge that a copy of this consent can be made available to me via my patient portal [facility] MyChart), or I can request a printed copy by calling the office of [facility].    I understand that my insurance will be billed for this visit.   I have read or had this consent read to me. I understand the contents of this consent, which adequately explains the benefits and risks of the Services being provided via telemedicine.  I have been provided ample opportunity to ask questions regarding this consent and the Services and have had my questions answered to my satisfaction. I give my informed consent for the services to be provided through the use of telemedicine in my medical care

## 2024-08-18 NOTE — Telephone Encounter (Signed)
"  ° °  Pre-operative Risk Assessment    Patient Name: Alex [name]  DOB: 1966/02/04 MRN: [id number]   Date of last office visit: 04/04/24 with [facility]  Date of next office visit: NA  Request for Surgical Clearance    Procedure:  Colonoscopy  Date of Surgery:  Clearance 09/07/24                                  Surgeon:  Dr. Ruel Socks Group or Practice Name:  [facility]  Phone number:  [phone] Fax number:  [phone]   Type of Clearance Requested:   - Medical  - Pharmacy:  Hold Clopidogrel  (Plavix ) not indicated   Type of Anesthesia:  Not indicated   Additional requests/questions:    Alex [name]   08/18/2024, 8:37 AM   "

## 2024-08-24 ENCOUNTER — Ambulatory Visit: Attending: Cardiology | Admitting: Emergency Medicine

## 2024-08-24 DIAGNOSIS — Z01810 Encounter for preprocedural cardiovascular examination: Secondary | ICD-10-CM

## 2024-08-24 NOTE — Progress Notes (Signed)
 "   Virtual Visit via Telephone Note   Because of Alex [name] co-morbid illnesses, he is at least at moderate risk for complications without adequate follow up.  This format is felt to be most appropriate for this patient at this time.  Due to technical limitations with video connection (technology), today's appointment will be conducted as an audio only telehealth visit, and Alex [name] verbally agreed to proceed in this manner.   All issues noted in this document were discussed and addressed.  No physical exam could be performed with this format.  Evaluation Performed:  Preoperative cardiovascular risk assessment _____________   Date:  08/24/2024   Patient ID:  Alex [name], DOB Jul 12, 1966, MRN [id number] Patient Location:  Home Provider location:   Office  Primary Care Provider:  Gasper Nancyann BRAVO, Alex [name] Primary Cardiologist:  Alex Lunger, Alex [name]  Chief Complaint / Patient Profile   59 y.o. y/o male with a h/o coronary artery disease, mixed hyperlipidemia, essential hypertension, alcoholic cirrhosis without ascites, alpha-1 antitrypsin deficiency, asthma/COPD, former smoker, peripheral arterial disease, type 2 diabetes on insulin  therapy, carotid stenosis who is pending colonoscopy on date 09/07/2024 with [facility] clinic and presents today for telephonic preoperative cardiovascular risk assessment.  History of Present Illness    Alex [name] is a 59 y.o. male who presents via audio/video conferencing for a telehealth visit today.  Pt was last seen in cardiology clinic on 04/04/2024 by Alex Fake, Alex [name].  At that time Alex [name] was doing well.  The patient is now pending procedure as outlined above. Since his last visit, he is doing well without acute cardiovascular concerns or complaints.  Continues to work currently at [employer].  Does stay active doing various activities at home and within the yard without any exertional symptoms.  He denies any chest pains, dyspnea, or syncope.  Denies  any weight gain, orthopnea, or PND.  He is without any symptoms suggestive of active angina.  He is able to complete greater than 4 METS.  Past Medical History    Past Medical History:  Diagnosis Date   Allergy    Anxiety    Cirrhosis (HCC)    Diabetes mellitus without complication (HCC)    GERD (gastroesophageal reflux disease)    History of chicken pox    Hypertension    Myocardial infarction [facility])    08   Past Surgical History:  Procedure Laterality Date   COLONOSCOPY WITH PROPOFOL  N/A 12/23/2020   Procedure: COLONOSCOPY WITH PROPOFOL ;  Surgeon: Alex [name], Alex [name];  Location: [facility] ENDOSCOPY;  Service: Gastroenterology;  Laterality: N/A;   ESOPHAGOGASTRODUODENOSCOPY (EGD) WITH PROPOFOL  N/A 05/01/2019   Procedure: ESOPHAGOGASTRODUODENOSCOPY (EGD) WITH PROPOFOL ;  Surgeon: Alex [name], Alex [name];  Location: [facility] ENDOSCOPY;  Service: Gastroenterology;  Laterality: N/A;   heart artery stent  2008   KNEE SURGERY     Myocrasial Perfusion Scan  09/13/2011   Abnormal images. Not thought to be significant per Dr. Hester    Allergies  Allergies[1]  Home Medications    Prior to Admission medications  Medication Sig Start Date End Date Taking? Authorizing Provider  alpha-1-proteinase inhibitor, human, (PROLASTIN-C) 1000 MG/20ML SOLN Inject 60 mg/kg into the vein once a week.    Provider, Historical, Alex [name]  ascorbic acid (VITAMIN C) 500 MG tablet Take by mouth.    Provider, Historical, Alex [name]  clopidogrel  (PLAVIX ) 75 MG tablet TAKE ONE TABLET BY MOUTH ONE TIME DAILY 05/11/24   Gollan, Timothy J, Alex [name]  Coenzyme Q10 (COQ10) 200  MG CAPS Take 1 capsule by mouth 2 (two) times daily.    Provider, Historical, Alex [name]  Continuous Blood Gluc Sensor (FREESTYLE LIBRE 14 DAY SENSOR) MISC  11/22/20   Provider, Historical, Alex [name]  Cyanocobalamin (VITAMIN B 12) 250 MCG LOZG Take by mouth.    Provider, Historical, Alex [name]  fluticasone (FLONASE) 50 MCG/ACT nasal spray Place into the nose. Patient taking differently: Place into the nose  as needed. 08/31/13   Provider, Historical, Alex [name]  Fluticasone-Umeclidin-Vilant (TRELEGY ELLIPTA ) 100-62.5-25 MCG/ACT AEPB Inhale 1 puff into the lungs daily. Patient taking differently: Inhale 1 puff into the lungs as needed. 07/20/22   Alex Dedra CROME, Alex [name]  insulin  regular human CONCENTRATED (HUMULIN R ) 500 UNIT/ML injection Use up to 250 units (as calculated by U100 syringe) daily via insulin  pump 02/16/17   Provider, Historical, Alex [name]  Ipratropium-Albuterol  (COMBIVENT  RESPIMAT) 20-100 MCG/ACT AERS respimat Inhale 1 puff into the lungs every 6 (six) hours as needed for wheezing (cough,shortness of breath). 10/26/23   Alex Dedra CROME, Alex [name]  ketoconazole (NIZORAL) 2 % shampoo  07/18/20   Provider, Historical, Alex [name]  LORazepam  (ATIVAN ) 0.5 MG tablet 1-2 every 6 hours as neededTake by mouth. 1-2 every 6 hours as needed 02/17/23   Alex Nancyann BRAVO, Alex [name]  metFORMIN (GLUCOPHAGE) 500 MG tablet Take 500 mg by mouth 2 (two) times daily.     Provider, Historical, Alex [name]  methocarbamol  (ROBAXIN ) 500 MG tablet Take 1-2 tablets (500-1,000 mg total) by mouth 4 (four) times daily. Patient not taking: Reported on 08/18/2024 01/04/24   Alex Nancyann BRAVO, Alex [name]  Multiple Vitamin (MULTIVITAMIN) tablet Take 1 tablet by mouth daily.    Provider, Historical, Alex [name]  Omega-3 Fatty Acids (FISH OIL) 1000 MG CAPS Take 1 capsule by mouth 2 (two) times daily.  12/05/08   Provider, Historical, Alex [name]  OVER THE COUNTER MEDICATION Take 600 mg by mouth in the morning and at bedtime. Beet Root    Provider, Historical, Alex [name]  pantoprazole  (PROTONIX ) 40 MG tablet TAKE ONE TABLET BY MOUTH ONE TIME DAILY 07/18/24   Alex Nancyann BRAVO, Alex [name]  rosuvastatin  (CRESTOR ) 40 MG tablet TAKE ONE TABLET BY MOUTH ONE TIME DAILY 06/12/24   Alex Frederick, Alex [name]  sertraline  (ZOLOFT ) 50 MG tablet TAKE ONE TABLET BY MOUTH ONE TIME DAILY 04/28/24   Alex Nancyann BRAVO, Alex [name]  tamsulosin  (FLOMAX ) 0.4 MG CAPS capsule Take 1 capsule (0.4 mg total) by mouth daily. Patient not taking: Reported on 08/18/2024  10/22/20   Alex Drivers, Alex [name]  telmisartan  (MICARDIS ) 40 MG tablet TAKE ONE TABLET BY MOUTH ONE TIME DAILY 12/23/23   Gollan, Timothy J, Alex [name]  vitamin E 45 MG (100 UNITS) capsule Take 1 capsule by mouth daily.    Provider, Historical, Alex [name]    Physical Exam    Vital Signs:  CARRON JAGGI does not have vital signs available for review today.   Given telephonic nature of communication, physical exam is limited. AAOx3. NAD. Normal affect.  Speech and respirations are unlabored.  Accessory Clinical Findings    None  Assessment & Plan    1.  Preoperative Cardiovascular Risk Assessment: According to the Revised Cardiac Risk Index (RCRI), his Perioperative Risk of Major Cardiac Event is (%): 6.6. His Functional Capacity in METs is: 5.62 according to the Duke Activity Status Index (DASI).  Therefore, based on ACC/AHA guidelines, patient would be at acceptable risk for the planned procedure without further cardiovascular testing. I will route this recommendation to the requesting party via Epic fax function.  The patient was advised  that if he develops new symptoms prior to surgery to contact our office to arrange for a follow-up visit, and he verbalized understanding.  He may hold Plavix  for 5 days prior to procedure. Please resume Plavix  as soon as possible postprocedure, at the discretion of the surgeon.    A copy of this note will be routed to requesting surgeon.  Time:   Today, I have spent 6 minutes with the patient with telehealth technology discussing medical history, symptoms, and management plan.     Alex LITTIE Louis, Alex [name]  08/24/2024, 10:08 AM     [1]  Allergies Allergen Reactions   Aleve  [Naproxen Sodium]    Mobic  [Meloxicam]    Voltaren [Diclofenac Sodium] Nausea And Vomiting   "

## 2024-09-07 ENCOUNTER — Ambulatory Visit: Admitting: Certified Registered"

## 2024-09-07 ENCOUNTER — Encounter: Admission: RE | Disposition: A | Payer: Self-pay | Source: Home / Self Care | Attending: Gastroenterology

## 2024-09-07 ENCOUNTER — Ambulatory Visit
Admission: RE | Admit: 2024-09-07 | Discharge: 2024-09-07 | Disposition: A | Discharge status: Home / Self Care | Attending: Gastroenterology | Admitting: Gastroenterology

## 2024-09-07 ENCOUNTER — Encounter: Payer: Self-pay | Admitting: Gastroenterology

## 2024-09-07 DIAGNOSIS — Z6833 Body mass index (BMI) 33.0-33.9, adult: Secondary | ICD-10-CM | POA: Diagnosis not present

## 2024-09-07 DIAGNOSIS — Z1211 Encounter for screening for malignant neoplasm of colon: Secondary | ICD-10-CM | POA: Insufficient documentation

## 2024-09-07 DIAGNOSIS — I252 Old myocardial infarction: Secondary | ICD-10-CM | POA: Diagnosis not present

## 2024-09-07 DIAGNOSIS — I10 Essential (primary) hypertension: Secondary | ICD-10-CM | POA: Diagnosis not present

## 2024-09-07 DIAGNOSIS — K7460 Unspecified cirrhosis of liver: Secondary | ICD-10-CM | POA: Diagnosis not present

## 2024-09-07 DIAGNOSIS — D124 Benign neoplasm of descending colon: Secondary | ICD-10-CM | POA: Diagnosis not present

## 2024-09-07 DIAGNOSIS — K449 Diaphragmatic hernia without obstruction or gangrene: Secondary | ICD-10-CM | POA: Diagnosis not present

## 2024-09-07 DIAGNOSIS — E66813 Obesity, class 3: Secondary | ICD-10-CM | POA: Diagnosis not present

## 2024-09-07 DIAGNOSIS — Z87891 Personal history of nicotine dependence: Secondary | ICD-10-CM | POA: Diagnosis not present

## 2024-09-07 DIAGNOSIS — E109 Type 1 diabetes mellitus without complications: Secondary | ICD-10-CM | POA: Insufficient documentation

## 2024-09-07 DIAGNOSIS — Z7984 Long term (current) use of oral hypoglycemic drugs: Secondary | ICD-10-CM | POA: Diagnosis not present

## 2024-09-07 DIAGNOSIS — Z794 Long term (current) use of insulin: Secondary | ICD-10-CM | POA: Insufficient documentation

## 2024-09-07 DIAGNOSIS — Z955 Presence of coronary angioplasty implant and graft: Secondary | ICD-10-CM | POA: Insufficient documentation

## 2024-09-07 DIAGNOSIS — K219 Gastro-esophageal reflux disease without esophagitis: Secondary | ICD-10-CM | POA: Diagnosis not present

## 2024-09-07 DIAGNOSIS — I2510 Atherosclerotic heart disease of native coronary artery without angina pectoris: Secondary | ICD-10-CM | POA: Insufficient documentation

## 2024-09-07 MED ORDER — MIDAZOLAM HCL 5 MG/5ML IJ SOLN
INTRAMUSCULAR | Status: DC | PRN
  Administered 2024-09-07: 2 mg via INTRAVENOUS

## 2024-09-07 MED ORDER — MIDAZOLAM HCL 2 MG/2ML IJ SOLN
INTRAMUSCULAR | Status: AC
  Filled 2024-09-07: qty 2

## 2024-09-07 MED ORDER — SODIUM CHLORIDE 0.9 % IV SOLN
INTRAVENOUS | Status: DC
  Administered 2024-09-07: 500 mL via INTRAVENOUS

## 2024-09-07 MED ORDER — PROPOFOL 10 MG/ML IV BOLUS
INTRAVENOUS | Status: DC | PRN
  Administered 2024-09-07: 100 mg via INTRAVENOUS

## 2024-09-07 MED ORDER — LIDOCAINE 2% (20 MG/ML) 5 ML SYRINGE
INTRAMUSCULAR | Status: DC | PRN
  Administered 2024-09-07: 20 mg via INTRAVENOUS

## 2024-09-07 MED ORDER — GLYCOPYRROLATE 0.2 MG/ML IJ SOLN
INTRAMUSCULAR | Status: DC | PRN
  Administered 2024-09-07: .2 mg via INTRAVENOUS

## 2024-09-07 MED ORDER — PROPOFOL 500 MG/50ML IV EMUL
INTRAVENOUS | Status: DC | PRN
  Administered 2024-09-07: 120 ug/kg/min via INTRAVENOUS

## 2024-09-07 NOTE — Op Note (Addendum)
 [facility] Gastroenterology Patient Name: Alex [name] Procedure Date: 09/07/2024 9:51 AM MRN: [id number] Account #: [id number] Date of Birth: [birth year] Admit Type: Outpatient Age: 59 Room: [facility] ENDO ROOM 2 Gender: Male Note Status: Supervisor Override Instrument Name: Colon Scope [id number] Procedure:             Colonoscopy Indications:           Surveillance: Personal history of colonic polyps                         (unknown histology) on last colonoscopy more than 3                         years ago, Last colonoscopy: May 2022 Providers:             Ruel Kung MD, MD Referring MD:          Nancyann BRAVO. Gasper, MD (Referring MD) Medicines:             Monitored Anesthesia Care Complications:         No immediate complications. Procedure:             Pre-Anesthesia Assessment:                        - Prior to the procedure, a History and Physical was                         performed, and patient medications, allergies and                         sensitivities were reviewed. The patient's tolerance                         of previous anesthesia was reviewed.                        - The risks and benefits of the procedure and the                         sedation options and risks were discussed with the                         patient. All questions were answered and informed                         consent was obtained.                        - ASA Grade Assessment: II - A patient with mild                         systemic disease.                        After obtaining informed consent, the colonoscope was                         passed under direct vision. Throughout the procedure,  the patient's blood pressure, pulse, and oxygen                         saturations were monitored continuously. The was                         introduced through the anus and advanced to the the                         cecum, identified by the appendiceal  orifice. The                         colonoscopy was performed with ease. The patient                         tolerated the procedure well. The quality of the bowel                         preparation was excellent. Findings:      The perianal and digital rectal examinations were normal.      Two sessile polyps were found in the descending colon. The polyps were 4       to 5 mm in size. These polyps were removed with a cold snare. Resection       and retrieval were complete. To prevent bleeding post-intervention, one       hemostatic clip was successfully placed. Clip manufacturer: [employer]. There was no bleeding at the end of the procedure.      The exam was otherwise without abnormality on direct and retroflexion       views. Impression:            - Two 4 to 5 mm polyps in the descending colon,                         removed with a cold snare. Resected and retrieved.                         Clip was placed. Clip manufacturer: [employer].                        - The examination was otherwise normal on direct and                         retroflexion views. Recommendation:        - Discharge patient to home.                        - Resume previous diet.                        - Continue present medications.                        - Await pathology results.                        - Repeat colonoscopy for surveillance based on  pathology results. Procedure Code(s):     --- Professional ---                        [phone], Colonoscopy, flexible; with removal of                         tumor(s), polyp(s), or other lesion(s) by snare                         technique Diagnosis Code(s):     --- Professional ---                        Z12.11, Encounter for screening for malignant neoplasm                         of colon                        D12.4, Benign neoplasm of descending colon CPT copyright 2022 American Medical Association. All rights  reserved. The codes documented in this report are preliminary and upon coder review may  be revised to meet current compliance requirements. Ruel Kung, MD Ruel Kung MD, MD 09/07/2024 10:23:59 AM This report has been signed electronically. Number of Addenda: 0 Note Initiated On: 09/07/2024 9:51 AM Scope Withdrawal Time: 0 hours 10 minutes 35 seconds  Total Procedure Duration: 0 hours 14 minutes 0 seconds  Estimated Blood Loss:  Estimated blood loss: none.      [facility]

## 2024-09-07 NOTE — H&P (Signed)
 "  Ruel Kung , MD [address], [place], [place], [address] Phone: [phone] Fax: [phone]  Primary Care Physician:  Gasper Nancyann BRAVO, MD   Pre-Procedure History & Physical: HPI:  Alex [name] is a 59 y.o. male is here for an endoscopy and colonoscopy    Past Medical History:  Diagnosis Date   Allergy    Anxiety    Cirrhosis (HCC)    Diabetes mellitus without complication (HCC)    GERD (gastroesophageal reflux disease)    History of chicken pox    Hypertension    Myocardial infarction [facility])    08    Past Surgical History:  Procedure Laterality Date   COLONOSCOPY WITH PROPOFOL  N/A 12/23/2020   Procedure: COLONOSCOPY WITH PROPOFOL ;  Surgeon: Kung Ruel, MD;  Location: [facility] ENDOSCOPY;  Service: Gastroenterology;  Laterality: N/A;   CORONARY ANGIOPLASTY     ESOPHAGOGASTRODUODENOSCOPY (EGD) WITH PROPOFOL  N/A 05/01/2019   Procedure: ESOPHAGOGASTRODUODENOSCOPY (EGD) WITH PROPOFOL ;  Surgeon: Kung Ruel, MD;  Location: [facility] ENDOSCOPY;  Service: Gastroenterology;  Laterality: N/A;   heart artery stent  08/10/2006   KNEE SURGERY     Myocrasial Perfusion Scan  09/13/2011   Abnormal images. Not thought to be significant per Dr. Hester    Prior to Admission medications  Medication Sig Start Date End Date Taking? Authorizing Provider  alpha-1-proteinase inhibitor, human, (PROLASTIN-C) 1000 MG/20ML SOLN Inject 60 mg/kg into the vein once a week.   Yes [provider]  ascorbic acid (VITAMIN C) 500 MG tablet Take by mouth.   Yes [provider]  Coenzyme Q10 (COQ10) 200 MG CAPS Take 1 capsule by mouth 2 (two) times daily.   Yes [provider]  Continuous Blood Gluc Sensor (FREESTYLE LIBRE 14 DAY SENSOR) MISC  11/22/20  Yes [provider]  Cyanocobalamin (VITAMIN B 12) 250 MCG LOZG Take by mouth.   Yes [provider]  insulin  regular human CONCENTRATED (HUMULIN R ) 500 UNIT/ML injection Use up to 250 units (as  calculated by U100 syringe) daily via insulin  pump 02/16/17  Yes [provider]  Ipratropium-Albuterol  (COMBIVENT  RESPIMAT) 20-100 MCG/ACT AERS respimat Inhale 1 puff into the lungs every 6 (six) hours as needed for wheezing (cough,shortness of breath). 10/26/23  Yes Tamea Dedra CROME, MD  LORazepam  (ATIVAN ) 0.5 MG tablet 1-2 every 6 hours as neededTake by mouth. 1-2 every 6 hours as needed 02/17/23  Yes Fisher, Nancyann BRAVO, MD  metFORMIN (GLUCOPHAGE) 500 MG tablet Take 500 mg by mouth 2 (two) times daily.    Yes [provider]  Multiple Vitamin (MULTIVITAMIN) tablet Take 1 tablet by mouth daily.   Yes [provider]  Omega-3 Fatty Acids (FISH OIL) 1000 MG CAPS Take 1 capsule by mouth 2 (two) times daily.  12/05/08  Yes [provider]  OVER THE COUNTER MEDICATION Take 600 mg by mouth in the morning and at bedtime. Beet Root   Yes [provider]  pantoprazole  (PROTONIX ) 40 MG tablet TAKE ONE TABLET BY MOUTH ONE TIME DAILY 07/18/24  Yes Gasper Nancyann BRAVO, MD  rosuvastatin  (CRESTOR ) 40 MG tablet TAKE ONE TABLET BY MOUTH ONE TIME DAILY 06/12/24  Yes Hammock, Tylene, NP  sertraline  (ZOLOFT ) 50 MG tablet TAKE ONE TABLET BY MOUTH ONE TIME DAILY 04/28/24  Yes Gasper Nancyann BRAVO, MD  telmisartan  (MICARDIS ) 40 MG tablet TAKE ONE TABLET BY MOUTH ONE TIME DAILY 12/23/23  Yes Gollan, Timothy J, MD  vitamin E 45 MG (100 UNITS) capsule Take 1  capsule by mouth daily.   Yes [provider]  clopidogrel  (PLAVIX ) 75 MG tablet TAKE ONE TABLET BY MOUTH ONE TIME DAILY 05/11/24   Gollan, Timothy J, MD  fluticasone (FLONASE) 50 MCG/ACT nasal spray Place into the nose. Patient taking differently: Place into the nose as needed. 08/31/13   [provider]  Fluticasone-Umeclidin-Vilant (TRELEGY ELLIPTA ) 100-62.5-25 MCG/ACT AEPB Inhale 1 puff into the lungs daily. Patient taking differently: Inhale 1 puff into the lungs as needed. 07/20/22   Tamea Dedra CROME, MD   ketoconazole (NIZORAL) 2 % shampoo  07/18/20   [provider]  methocarbamol  (ROBAXIN ) 500 MG tablet Take 1-2 tablets (500-1,000 mg total) by mouth 4 (four) times daily. Patient not taking: Reported on 08/18/2024 01/04/24   Gasper Nancyann BRAVO, MD  tamsulosin  (FLOMAX ) 0.4 MG CAPS capsule Take 1 capsule (0.4 mg total) by mouth daily. Patient not taking: Reported on 08/18/2024 10/22/20   Dorothyann Drivers, MD    Allergies as of 09/06/2024 - Review Complete 08/24/2024  Allergen Reaction Noted   Aleve  [naproxen sodium]  01/29/2015   Mobic  [meloxicam]  01/29/2015   Voltaren [diclofenac sodium] Nausea And Vomiting 01/29/2015    Family History  Problem Relation Age of Onset   Hypertension Mother    Diabetes Mother        insulin  dependent   Hyperlipidemia Mother    Cancer Mother    Cirrhosis Father    Diabetes Father    Stroke Father    Diabetes Brother    Diabetes Other    Hypertension Other    Heart disease Other    Arthritis Other     Social History   Socioeconomic History   Marital status: Married    Spouse name: Not on file   Number of children: Not on file   Years of education: Not on file   Highest education level: 12th grade  Occupational History   Occupation: Office Manager  Tobacco Use   Smoking status: Former    Current packs/day: 0.00    Average packs/day: 1.5 packs/day for 20.0 years (30.0 ttl pk-yrs)    Types: Cigarettes    Start date: 08/10/1986    Quit date: 08/10/2006    Years since quitting: 18.0   Smokeless tobacco: Never  Vaping Use   Vaping status: Never Used  Substance and Sexual Activity   Alcohol use: No   Drug use: No   Sexual activity: Yes    Birth control/protection: None  Other Topics Concern   Not on file  Social History Narrative   Not on file   Social Drivers of Health   Tobacco Use: Medium Risk (09/07/2024)   Patient History    Smoking Tobacco Use: Former    Smokeless Tobacco Use: Never    Passive Exposure: Not on file   Financial Resource Strain: Medium Risk (06/29/2024)   Received from [facility]   Overall Financial Resource Strain (CARDIA)    Difficulty of Paying Living Expenses: Somewhat hard  Food Insecurity: Food Insecurity Present (06/29/2024)   Received from [facility]   Epic    Within the past 12 months, you worried that your food would run out before you got the money to buy more.: Often true    Within the past 12 months, the food you bought just didn't last and you didn't have money to get more.: Often true  Transportation Needs: No Transportation Needs (06/29/2024)   Received from [facility]  PRAPARE - Transportation    In the past 12 months, has lack of transportation kept you from medical appointments or from getting medications?: No    Lack of Transportation (Non-Medical): No  Physical Activity: Insufficiently Active (12/05/2023)   Exercise Vital Sign    Days of Exercise per Week: 2 days    Minutes of Exercise per Session: 30 min  Stress: No Stress Concern Present (12/05/2023)   [employer] of Occupational Health - Occupational Stress Questionnaire    Feeling of Stress : Only a little  Social Connections: Moderately Isolated (12/05/2023)   Social Connection and Isolation Panel    Frequency of Communication with Friends and Family: More than three times a week    Frequency of Social Gatherings with Friends and Family: Never    Attends Religious Services: Never    Database Administrator or Organizations: No    Attends Engineer, Structural: Not on file    Marital Status: Married  Catering Manager Violence: Not on file  Depression (PHQ2-9): Medium Risk (10/12/2023)   Depression (PHQ2-9)    PHQ-2 Score: 5  Alcohol Screen: Not on file  Housing: Low Risk  (06/29/2024)   Received from [facility]   Epic    In the last 12 months, was there a time when you were not able to pay the mortgage or rent on  time?: No    In the past 12 months, how many times have you moved where you were living?: 0    At any time in the past 12 months, were you homeless or living in a shelter (including now)?: No  Utilities: Not At Risk (06/29/2024)   Received from [facility]   Epic    In the past 12 months has the electric, gas, oil, or water company threatened to shut off services in your home?: No  Health Literacy: Not on file    Review of Systems: See HPI, otherwise negative ROS  Physical Exam: BP (!) 157/94   Pulse 92   Temp (!) 96.6 F (35.9 C) (Temporal)   Resp 18   Ht 5' 10 (1.778 m)   Wt 106 kg   SpO2 99%   BMI 33.52 kg/m  General:   Alert,  pleasant and cooperative in NAD Head:  Normocephalic and atraumatic. Neck:  Supple; no masses or thyromegaly. Lungs:  Clear throughout to auscultation, normal respiratory effort.    Heart:  +S1, +S2, Regular rate and rhythm, No edema. Abdomen:  Soft, nontender and nondistended. Normal bowel sounds, without guarding, and without rebound.   Neurologic:  Alert and  oriented x4;  grossly normal neurologically.  Impression/Plan: Alex [name] is here for an endoscopy and colonoscopy  to be performed for  evaluation of colon cancer screening and screening if esophageal varices    Risks, benefits, limitations, and alternatives regarding endoscopy have been reviewed with the patient.  Questions have been answered.  All parties agreeable.   Ruel Kung, MD  09/07/2024, 9:32 AM  "

## 2024-09-07 NOTE — Op Note (Signed)
 [facility] Gastroenterology Patient Name: Alex [name] Procedure Date: 09/07/2024 9:52 AM MRN: [id number] Account #: [id number] Date of Birth: 1966-01-26 Admit Type: Outpatient Age: 59 Room: [facility] ENDO ROOM 2 Gender: Male Note Status: Finalized Instrument Name: Endoscope [id number] Procedure:             Upper GI endoscopy Indications:           Cirrhosis rule out esophageal varices Providers:             Ruel Kung MD, MD Referring MD:          Nancyann BRAVO. Gasper, MD (Referring MD) Medicines:             Monitored Anesthesia Care Complications:         No immediate complications. Procedure:             Pre-Anesthesia Assessment:                        - Prior to the procedure, a History and Physical was                         performed, and patient medications, allergies and                         sensitivities were reviewed. The patient's tolerance                         of previous anesthesia was reviewed.                        - The risks and benefits of the procedure and the                         sedation options and risks were discussed with the                         patient. All questions were answered and informed                         consent was obtained.                        - ASA Grade Assessment: II - A patient with mild                         systemic disease.                        After obtaining informed consent, the endoscope was                         passed under direct vision. Throughout the procedure,                         the patient's blood pressure, pulse, and oxygen                         saturations were monitored continuously. The Endoscope  was introduced through the mouth, and advanced to the                         third part of duodenum. The upper GI endoscopy was                         accomplished with ease. The patient tolerated the                         procedure well. Findings:      The  esophagus was normal.      The examined duodenum was normal.      The gastroesophageal flap valve was visualized endoscopically and       classified as Hill Grade III (minimal fold, loose to endoscope, hiatal       hernia likely). Impression:            - Normal esophagus.                        - Normal examined duodenum.                        - Gastroesophageal flap valve classified as Hill Grade                         III (minimal fold, loose to endoscope, hiatal hernia                         likely).                        - No specimens collected. Recommendation:        - Discharge patient to home (with escort).                        - Repeat upper endoscopy in 3 years for surveillance. Procedure Code(s):     --- Professional ---                        [phone], Esophagogastroduodenoscopy, flexible,                         transoral; diagnostic, including collection of                         specimen(s) by brushing or washing, when performed                         (separate procedure) Diagnosis Code(s):     --- Professional ---                        K74.60, Unspecified cirrhosis of liver CPT copyright 2022 American Medical Association. All rights reserved. The codes documented in this report are preliminary and upon coder review may  be revised to meet current compliance requirements. Ruel Kung, MD Ruel Kung MD, MD 09/07/2024 10:05:33 AM This report has been signed electronically. Number of Addenda: 0 Note Initiated On: 09/07/2024 9:52 AM Estimated Blood Loss:  Estimated blood loss: none.      [facility]

## 2024-09-07 NOTE — Transfer of Care (Signed)
 Immediate Anesthesia Transfer of Care Note  Patient: Alex [name]  Procedure(s) Performed: COLONOSCOPY EGD (ESOPHAGOGASTRODUODENOSCOPY) POLYPECTOMY, INTESTINE CONTROL OF HEMORRHAGE, GI TRACT, ENDOSCOPIC, BY CLIPPING OR OVERSEWING  Patient Location: Endoscopy Unit  Anesthesia Type:General  Level of Consciousness: drowsy  Airway & Oxygen Therapy: Patient Spontanous Breathing  Post-op Assessment: Report given to RN and Post -op Vital signs reviewed and stable  Post vital signs: Reviewed  Last Vitals:  Vitals Value Taken Time  BP 105/74 09/07/24 10:23  Temp    Pulse 101 09/07/24 10:24  Resp 18 09/07/24 10:24  SpO2 96% 09/07/24 10:24    Last Pain:  Vitals:   09/07/24 0914  TempSrc: Temporal  PainSc: 0-No pain         Complications: No notable events documented.

## 2024-09-07 NOTE — Anesthesia Preprocedure Evaluation (Signed)
"                                    Anesthesia Evaluation  Patient identified by MRN, date of birth, ID band Patient awake    Reviewed: Allergy & Precautions, NPO status , Patient's Chart, lab work & pertinent test results  Airway Mallampati: II  TM Distance: >3 FB Neck ROM: Full    Dental  (+) Teeth Intact   Pulmonary neg pulmonary ROS, former smoker   Pulmonary exam normal        Cardiovascular Exercise Tolerance: Good hypertension, Pt. on medications + CAD, + Past MI and + Cardiac Stents   Rhythm:Regular Rate:Normal     Neuro/Psych   Anxiety        GI/Hepatic Neg liver ROS,GERD  Medicated,,  Endo/Other  diabetes, Well Controlled, Type 1, Insulin  Dependent  Class 3 obesity  Renal/GU   negative genitourinary   Musculoskeletal negative musculoskeletal ROS (+)    Abdominal  (+) + obese  Peds negative pediatric ROS (+)  Hematology negative hematology ROS (+)   Anesthesia Other Findings   Reproductive/Obstetrics                              Anesthesia Physical Anesthesia Plan  ASA: 3  Anesthesia Plan: General   Post-op Pain Management:    Induction: Intravenous  PONV Risk Score and Plan:   Airway Management Planned: Natural Airway and Nasal Cannula  Additional Equipment:   Intra-op Plan:   Post-operative Plan:   Informed Consent: I have reviewed the patients History and Physical, chart, labs and discussed the procedure including the risks, benefits and alternatives for the proposed anesthesia with the patient or authorized representative who has indicated his/her understanding and acceptance.       Plan Discussed with: CRNA  Anesthesia Plan Comments:         Anesthesia Quick Evaluation  "

## 2024-09-07 NOTE — Anesthesia Postprocedure Evaluation (Signed)
"   Anesthesia Post Note  Patient: Alex [name]  Procedure(s) Performed: COLONOSCOPY EGD (ESOPHAGOGASTRODUODENOSCOPY) POLYPECTOMY, INTESTINE CONTROL OF HEMORRHAGE, GI TRACT, ENDOSCOPIC, BY CLIPPING OR OVERSEWING  Patient location during evaluation: PACU Anesthesia Type: General Level of consciousness: awake Pain management: satisfactory to patient Vital Signs Assessment: post-procedure vital signs reviewed and stable Respiratory status: spontaneous breathing Cardiovascular status: stable Anesthetic complications: no   No notable events documented.   Last Vitals:  Vitals:   09/07/24 1036 09/07/24 1045  BP: 101/69 110/73  Pulse: (!) 105 100  Resp: 14 13  Temp:    SpO2: 96% 96%    Last Pain:  Vitals:   09/07/24 1036  TempSrc:   PainSc: 0-No pain                 VAN STAVEREN,Brooklynne Pereida      "

## 2024-09-08 LAB — SURGICAL PATHOLOGY

## 2024-09-13 ENCOUNTER — Encounter: Payer: Self-pay | Admitting: Gastroenterology

## 2024-09-14 ENCOUNTER — Ambulatory Visit: Payer: Self-pay | Admitting: Gastroenterology
